# Patient Record
Sex: Female | Born: 1950
Health system: Southern US, Community
[De-identification: ages and names within clinical notes are randomized; demographics above are authoritative.]

## PROBLEM LIST (undated history)

## (undated) DIAGNOSIS — E785 Hyperlipidemia, unspecified: Secondary | ICD-10-CM

## (undated) DIAGNOSIS — F419 Anxiety disorder, unspecified: Secondary | ICD-10-CM

## (undated) DIAGNOSIS — F32A Depression, unspecified: Secondary | ICD-10-CM

## (undated) DIAGNOSIS — K219 Gastro-esophageal reflux disease without esophagitis: Secondary | ICD-10-CM

## (undated) DIAGNOSIS — T7840XA Allergy, unspecified, initial encounter: Secondary | ICD-10-CM

## (undated) DIAGNOSIS — R011 Cardiac murmur, unspecified: Secondary | ICD-10-CM

## (undated) DIAGNOSIS — E079 Disorder of thyroid, unspecified: Secondary | ICD-10-CM

## (undated) HISTORY — PX: ABDOMINAL HYSTERECTOMY: SHX81

## (undated) HISTORY — DX: Cardiac murmur, unspecified: R01.1

## (undated) HISTORY — DX: Allergy, unspecified, initial encounter: T78.40XA

## (undated) HISTORY — DX: Disorder of thyroid, unspecified: E07.9

## (undated) HISTORY — DX: Hyperlipidemia, unspecified: E78.5

## (undated) HISTORY — PX: TONSILLECTOMY: SUR1361

## (undated) HISTORY — DX: Depression, unspecified: F32.A

## (undated) HISTORY — DX: Gastro-esophageal reflux disease without esophagitis: K21.9

## (undated) HISTORY — PX: BREAST BIOPSY: SHX20

## (undated) HISTORY — DX: Anxiety disorder, unspecified: F41.9

---

## 2016-08-06 DIAGNOSIS — M79604 Pain in right leg: Secondary | ICD-10-CM | POA: Diagnosis not present

## 2016-08-21 ENCOUNTER — Ambulatory Visit (INDEPENDENT_AMBULATORY_CARE_PROVIDER_SITE_OTHER): Payer: Medicare Other | Admitting: Internal Medicine

## 2016-08-21 ENCOUNTER — Encounter: Payer: Self-pay | Admitting: Internal Medicine

## 2016-08-21 VITALS — BP 140/80 | HR 65 | Temp 97.5°F | Ht 60.0 in | Wt 110.6 lb

## 2016-08-21 DIAGNOSIS — Z1239 Encounter for other screening for malignant neoplasm of breast: Secondary | ICD-10-CM

## 2016-08-21 DIAGNOSIS — E7800 Pure hypercholesterolemia, unspecified: Secondary | ICD-10-CM | POA: Diagnosis not present

## 2016-08-21 DIAGNOSIS — Z1231 Encounter for screening mammogram for malignant neoplasm of breast: Secondary | ICD-10-CM

## 2016-08-21 DIAGNOSIS — Z87898 Personal history of other specified conditions: Secondary | ICD-10-CM

## 2016-08-21 DIAGNOSIS — Z720 Tobacco use: Secondary | ICD-10-CM

## 2016-08-21 DIAGNOSIS — Z8742 Personal history of other diseases of the female genital tract: Secondary | ICD-10-CM

## 2016-08-21 DIAGNOSIS — Z1211 Encounter for screening for malignant neoplasm of colon: Secondary | ICD-10-CM

## 2016-08-21 DIAGNOSIS — E039 Hypothyroidism, unspecified: Secondary | ICD-10-CM

## 2016-08-21 DIAGNOSIS — Z9109 Other allergy status, other than to drugs and biological substances: Secondary | ICD-10-CM

## 2016-08-21 LAB — CBC WITH DIFFERENTIAL/PLATELET
BASOS PCT: 0.5 % (ref 0.0–3.0)
Basophils Absolute: 0.1 10*3/uL (ref 0.0–0.1)
EOS PCT: 1.1 % (ref 0.0–5.0)
Eosinophils Absolute: 0.1 10*3/uL (ref 0.0–0.7)
HCT: 42 % (ref 36.0–46.0)
Hemoglobin: 14.1 g/dL (ref 12.0–15.0)
LYMPHS ABS: 4.1 10*3/uL — AB (ref 0.7–4.0)
Lymphocytes Relative: 36.2 % (ref 12.0–46.0)
MCHC: 33.5 g/dL (ref 30.0–36.0)
MCV: 96.6 fl (ref 78.0–100.0)
MONOS PCT: 5.9 % (ref 3.0–12.0)
Monocytes Absolute: 0.7 10*3/uL (ref 0.1–1.0)
NEUTROS ABS: 6.4 10*3/uL (ref 1.4–7.7)
NEUTROS PCT: 56.3 % (ref 43.0–77.0)
Platelets: 274 10*3/uL (ref 150.0–400.0)
RBC: 4.34 Mil/uL (ref 3.87–5.11)
RDW: 13.5 % (ref 11.5–15.5)
WBC: 11.3 10*3/uL — ABNORMAL HIGH (ref 4.0–10.5)

## 2016-08-21 LAB — LIPID PANEL
CHOLESTEROL: 172 mg/dL (ref 0–200)
HDL: 67.9 mg/dL (ref >39.00)
LDL CALC: 90 mg/dL (ref 0–99)
NonHDL: 104.3
TRIGLYCERIDES: 71 mg/dL (ref 0.0–149.0)
Total CHOL/HDL Ratio: 3
VLDL: 14.2 mg/dL (ref 0.0–40.0)

## 2016-08-21 LAB — HEPATIC FUNCTION PANEL
ALBUMIN: 4.5 g/dL (ref 3.5–5.2)
ALT: 19 U/L (ref 0–35)
AST: 17 U/L (ref 0–37)
Alkaline Phosphatase: 73 U/L (ref 39–117)
Bilirubin, Direct: 0.1 mg/dL (ref 0.0–0.3)
TOTAL PROTEIN: 7 g/dL (ref 6.0–8.3)
Total Bilirubin: 0.5 mg/dL (ref 0.2–1.2)

## 2016-08-21 LAB — BASIC METABOLIC PANEL
BUN: 7 mg/dL (ref 6–23)
CALCIUM: 10.1 mg/dL (ref 8.4–10.5)
CO2: 31 meq/L (ref 19–32)
CREATININE: 0.72 mg/dL (ref 0.40–1.20)
Chloride: 107 mEq/L (ref 96–112)
GFR: 86.36 mL/min (ref >60.00)
Glucose, Bld: 84 mg/dL (ref 70–99)
Potassium: 4.8 mEq/L (ref 3.5–5.1)
Sodium: 143 mEq/L (ref 135–145)

## 2016-08-21 LAB — TSH: TSH: 2.31 u[IU]/mL (ref 0.35–4.50)

## 2016-08-21 NOTE — Progress Notes (Signed)
Patient ID: Leah Benitez, female   DOB: 07/17/1951, 65 y.o.   MRN: [id number]   Subjective:    Patient ID: Leah Benitez, female    DOB: 1951/06/23, 65 y.o.   MRN: [id number]  HPI  Patient here to establish care.  She has a history of hypothyroidism and hypercholesterolemia.  Previously followed at [facility].  She smokes.  Has smoked for 40 years.  Tries to stay active.  No chest pain.  No sob.  No acid reflux.  No abdominal pain or cramping.  Bowels stable.  Previously on cholesterol medication.  Had joint aches and muscle aches.  Previous colonoscopy - no polyps.  Recommended f/u colonoscopy in 10 years.  Some allergy issues.  Takes zyrtec and mucinex prn     Past Medical History:  Diagnosis Date  . Allergy   . Hyperlipidemia   . Thyroid disease    Past Surgical History:  Procedure Laterality Date  . ABDOMINAL HYSTERECTOMY     previous abnormal pap smear  . BREAST BIOPSY    . TONSILLECTOMY     Family History  Problem Relation Age of Onset  . Arthritis Mother   . Stroke Mother   . Hypertension Mother   . Arthritis Father   . Heart disease Father   . Heart disease Brother   . Cancer Maternal Aunt     breast   Social History   Social History  . Marital status: Married    Spouse name: N/A  . Number of children: N/A  . Years of education: N/A   Social History Main Topics  . Smoking status: Current Every Day Smoker  . Smokeless tobacco: Never Used  . Alcohol use None  . Drug use: Unknown  . Sexual activity: Not Asked   Other Topics Concern  . None   Social History Narrative  . None    Outpatient Encounter Prescriptions as of 08/21/2016  Medication Sig  . atorvastatin (LIPITOR) 20 MG tablet Take 20 mg by mouth daily.  . ergocalciferol (VITAMIN D2) 50000 units capsule Take 50,000 Units by mouth once a week.  . levothyroxine (SYNTHROID, LEVOTHROID) 88 MCG tablet Take 88 mcg by mouth daily.   No facility-administered encounter medications on file as of  08/21/2016.     Review of Systems  Constitutional: Negative for appetite change and unexpected weight change.  HENT: Negative for congestion and sinus pressure.   Respiratory: Negative for cough, chest tightness and shortness of breath.   Cardiovascular: Negative for chest pain, palpitations and leg swelling.  Gastrointestinal: Negative for abdominal pain, diarrhea, nausea and vomiting.  Genitourinary: Negative for difficulty urinating and dysuria.  Musculoskeletal: Negative for back pain.  Skin: Negative for color change and rash.  Neurological: Negative for dizziness, light-headedness and headaches.  Psychiatric/Behavioral: Negative for agitation and dysphoric mood.       Objective:     Blood pressure rechecked by me:  132/72  Physical Exam  Constitutional: She appears well-developed and well-nourished. No distress.  HENT:  Nose: Nose normal.  Mouth/Throat: Oropharynx is clear and moist.  Neck: Neck supple. No thyromegaly present.  Cardiovascular: Normal rate and regular rhythm.   Pulmonary/Chest: Breath sounds normal. No respiratory distress. She has no wheezes.  Abdominal: Soft. Bowel sounds are normal. There is no tenderness.  Musculoskeletal: She exhibits no edema or tenderness.  Lymphadenopathy:    She has no cervical adenopathy.  Skin: No rash noted. No erythema.  Psychiatric: She has a normal mood and affect. Her behavior  is normal.    BP 140/80   Pulse 65   Temp 97.5 F (36.4 C) (Oral)   Ht 5' (1.524 m)   Wt 110 lb 9.6 oz (50.2 kg)   SpO2 98%   BMI 21.60 kg/m  Wt Readings from Last 3 Encounters:  08/21/16 110 lb 9.6 oz (50.2 kg)       Assessment & Plan:   Problem List Items Addressed This Visit    Colon cancer screening    Had colonoscopy - not sure of date.  No polyps.  Recommended f/u colonoscopy in 10 years.        Environmental allergies    Doing well on current regimen.  Follow.        History of abnormal cervical Pap smear    S/p  hysterectomy.        Hypercholesterolemia    Unable to take statin medication.  Low cholesterol diet and exercise.  Follow lipid panel.        Relevant Medications   atorvastatin (LIPITOR) 20 MG tablet   Other Relevant Orders   CBC with Differential/Platelet (Completed)   Basic metabolic panel (Completed)   Hepatic function panel (Completed)   TSH (Completed)   Lipid panel (Completed)   Hypothyroidism    On synthroid.  Follow tsh.        Relevant Medications   levothyroxine (SYNTHROID, LEVOTHROID) 88 MCG tablet   Tobacco abuse    Discussed with her today regarding the need to stop smoking.  She continues to smoke.  Discussed screening CT.  Will contact to schedule.         Other Visit Diagnoses    Encounter for breast cancer screening other than mammogram    -  Primary   Breast cancer screening, high risk patient       Relevant Orders   MM Digital Screening     I spent 45 minutes with the patient and more than 50% of the time was spent in consultation regarding the above.  Time spent obtaining previous history and discussing previous w/up and treatment.  Also time spent discussing current concerns and treatment plan.     Einar Pheasant, MD

## 2016-08-21 NOTE — Patient Instructions (Signed)
prevnar - (new pneumonia shot) 

## 2016-08-21 NOTE — Progress Notes (Signed)
Pre visit review using our clinic review tool, if applicable. No additional management support is needed unless otherwise documented below in the visit note. 

## 2016-08-22 ENCOUNTER — Telehealth: Payer: Self-pay

## 2016-08-22 ENCOUNTER — Other Ambulatory Visit: Payer: Self-pay | Admitting: Internal Medicine

## 2016-08-22 DIAGNOSIS — D72829 Elevated white blood cell count, unspecified: Secondary | ICD-10-CM

## 2016-08-22 NOTE — Progress Notes (Signed)
Order placed for f/u cbc.   

## 2016-08-22 NOTE — Telephone Encounter (Signed)
-----   Message from Einar Pheasant, MD sent at 08/22/2016  3:01 AM EST ----- Notify pt that her cholesterol looks good.  Her white blood cell count is slightly elevated.  May just be a normal variant.  Will need to repeat to confirm stable.  Thyroid test, kidney function tests and liver function tests are wnl.  Recheck cbc in 3 weeks.

## 2016-08-31 ENCOUNTER — Encounter: Payer: Self-pay | Admitting: Internal Medicine

## 2016-08-31 DIAGNOSIS — Z1211 Encounter for screening for malignant neoplasm of colon: Secondary | ICD-10-CM | POA: Insufficient documentation

## 2016-08-31 DIAGNOSIS — Z720 Tobacco use: Secondary | ICD-10-CM | POA: Insufficient documentation

## 2016-08-31 DIAGNOSIS — Z9109 Other allergy status, other than to drugs and biological substances: Secondary | ICD-10-CM | POA: Insufficient documentation

## 2016-08-31 DIAGNOSIS — Z8742 Personal history of other diseases of the female genital tract: Secondary | ICD-10-CM | POA: Insufficient documentation

## 2016-08-31 DIAGNOSIS — E039 Hypothyroidism, unspecified: Secondary | ICD-10-CM | POA: Insufficient documentation

## 2016-08-31 NOTE — Assessment & Plan Note (Signed)
Had colonoscopy - not sure of date.  No polyps.  Recommended f/u colonoscopy in 10 years.

## 2016-08-31 NOTE — Assessment & Plan Note (Signed)
Doing well on current regimen.  Follow.   

## 2016-08-31 NOTE — Assessment & Plan Note (Signed)
On synthroid.  Follow tsh.   

## 2016-08-31 NOTE — Assessment & Plan Note (Signed)
S/p hysterectomy.  

## 2016-08-31 NOTE — Assessment & Plan Note (Signed)
Unable to take statin medication.  Low cholesterol diet and exercise.  Follow lipid panel.  

## 2016-08-31 NOTE — Assessment & Plan Note (Signed)
Discussed with her today regarding the need to stop smoking.  She continues to smoke.  Discussed screening CT.  Will contact to schedule.

## 2016-09-09 DIAGNOSIS — Z1231 Encounter for screening mammogram for malignant neoplasm of breast: Secondary | ICD-10-CM | POA: Diagnosis not present

## 2016-09-09 LAB — HM MAMMOGRAPHY

## 2016-09-10 ENCOUNTER — Encounter: Payer: Self-pay | Admitting: Internal Medicine

## 2016-09-12 ENCOUNTER — Other Ambulatory Visit (INDEPENDENT_AMBULATORY_CARE_PROVIDER_SITE_OTHER): Payer: Medicare Other

## 2016-09-12 DIAGNOSIS — D72829 Elevated white blood cell count, unspecified: Secondary | ICD-10-CM

## 2016-09-12 LAB — CBC WITH DIFFERENTIAL/PLATELET
BASOS PCT: 0.6 % (ref 0.0–3.0)
Basophils Absolute: 0.1 10*3/uL (ref 0.0–0.1)
EOS ABS: 0.1 10*3/uL (ref 0.0–0.7)
EOS PCT: 1.2 % (ref 0.0–5.0)
HCT: 41.5 % (ref 36.0–46.0)
Hemoglobin: 14.2 g/dL (ref 12.0–15.0)
LYMPHS ABS: 3 10*3/uL (ref 0.7–4.0)
Lymphocytes Relative: 27.6 % (ref 12.0–46.0)
MCHC: 34.2 g/dL (ref 30.0–36.0)
MCV: 95.9 fl (ref 78.0–100.0)
MONO ABS: 0.7 10*3/uL (ref 0.1–1.0)
Monocytes Relative: 6.3 % (ref 3.0–12.0)
NEUTROS ABS: 6.9 10*3/uL (ref 1.4–7.7)
NEUTROS PCT: 64.3 % (ref 43.0–77.0)
PLATELETS: 279 10*3/uL (ref 150.0–400.0)
RBC: 4.33 Mil/uL (ref 3.87–5.11)
RDW: 13.4 % (ref 11.5–15.5)
WBC: 10.8 10*3/uL — ABNORMAL HIGH (ref 4.0–10.5)

## 2016-10-17 DIAGNOSIS — M109 Gout, unspecified: Secondary | ICD-10-CM | POA: Diagnosis not present

## 2016-10-17 DIAGNOSIS — M8430XA Stress fracture, unspecified site, initial encounter for fracture: Secondary | ICD-10-CM | POA: Diagnosis not present

## 2016-10-17 DIAGNOSIS — M79671 Pain in right foot: Secondary | ICD-10-CM | POA: Diagnosis not present

## 2016-10-31 DIAGNOSIS — Z23 Encounter for immunization: Secondary | ICD-10-CM | POA: Diagnosis not present

## 2016-11-03 ENCOUNTER — Telehealth: Payer: Self-pay | Admitting: *Deleted

## 2016-11-03 DIAGNOSIS — Z87891 Personal history of nicotine dependence: Secondary | ICD-10-CM

## 2016-11-03 NOTE — Telephone Encounter (Signed)
Received referral for initial lung cancer screening scan. Contacted patient and obtained smoking history,(current, 45 pack year) as well as answering questions related to screening process. Patient denies signs of lung cancer such as weight loss or hemoptysis. Patient denies comorbidity that would prevent curative treatment if lung cancer were found. Patient is tentatively scheduled for shared decision making visit and CT scan on 11/11/16 at 1:30pm, pending insurance approval from business office.

## 2016-11-07 DIAGNOSIS — M79671 Pain in right foot: Secondary | ICD-10-CM | POA: Diagnosis not present

## 2016-11-07 DIAGNOSIS — M84374D Stress fracture, right foot, subsequent encounter for fracture with routine healing: Secondary | ICD-10-CM | POA: Diagnosis not present

## 2016-11-11 ENCOUNTER — Ambulatory Visit
Admission: RE | Admit: 2016-11-11 | Discharge: 2016-11-11 | Disposition: A | Discharge status: Home / Self Care | Payer: Medicare Other | Source: Ambulatory Visit | Attending: Oncology | Admitting: Oncology

## 2016-11-11 ENCOUNTER — Inpatient Hospital Stay: Payer: Medicare Other | Attending: Oncology | Admitting: Oncology

## 2016-11-11 ENCOUNTER — Encounter (INDEPENDENT_AMBULATORY_CARE_PROVIDER_SITE_OTHER): Payer: Self-pay

## 2016-11-11 ENCOUNTER — Encounter: Payer: Self-pay | Admitting: Oncology

## 2016-11-11 DIAGNOSIS — J439 Emphysema, unspecified: Secondary | ICD-10-CM | POA: Diagnosis not present

## 2016-11-11 DIAGNOSIS — I700 Atherosclerosis of aorta: Secondary | ICD-10-CM | POA: Insufficient documentation

## 2016-11-11 DIAGNOSIS — Z122 Encounter for screening for malignant neoplasm of respiratory organs: Secondary | ICD-10-CM | POA: Diagnosis not present

## 2016-11-11 DIAGNOSIS — Z87891 Personal history of nicotine dependence: Secondary | ICD-10-CM

## 2016-11-11 DIAGNOSIS — F17210 Nicotine dependence, cigarettes, uncomplicated: Secondary | ICD-10-CM | POA: Diagnosis not present

## 2016-11-11 DIAGNOSIS — I2510 Atherosclerotic heart disease of native coronary artery without angina pectoris: Secondary | ICD-10-CM | POA: Diagnosis not present

## 2016-11-12 DIAGNOSIS — Z87891 Personal history of nicotine dependence: Secondary | ICD-10-CM | POA: Insufficient documentation

## 2016-11-12 NOTE — Progress Notes (Signed)
In accordance with CMS guidelines, patient has met eligibility criteria including age, absence of signs or symptoms of lung cancer.  Social History  Substance Use Topics  . Smoking status: Current Every Day Smoker    Packs/day: 1.00    Years: 45.00    Types: Cigarettes  . Smokeless tobacco: Never Used  . Alcohol use Not on file     A shared decision-making session was conducted prior to the performance of CT scan. This includes one or more decision aids, includes benefits and harms of screening, follow-up diagnostic testing, over-diagnosis, false positive rate, and total radiation exposure.  Counseling on the importance of adherence to annual lung cancer LDCT screening, impact of co-morbidities, and ability or willingness to undergo diagnosis and treatment is imperative for compliance of the program.  Counseling on the importance of continued smoking cessation for former smokers; the importance of smoking cessation for current smokers, and information about tobacco cessation interventions have been given to patient including [place] and [facility] quit [place] programs.  Written order for lung cancer screening with LDCT has been given to the patient and any and all questions have been answered to the best of my abilities.   Yearly follow up will be coordinated by Burgess Estelle, Thoracic Navigator.

## 2016-11-14 ENCOUNTER — Encounter: Payer: Self-pay | Admitting: *Deleted

## 2016-11-17 ENCOUNTER — Ambulatory Visit (INDEPENDENT_AMBULATORY_CARE_PROVIDER_SITE_OTHER): Payer: Medicare Other | Admitting: Internal Medicine

## 2016-11-17 ENCOUNTER — Encounter: Payer: Self-pay | Admitting: Internal Medicine

## 2016-11-17 VITALS — BP 120/60 | HR 82 | Temp 98.6°F | Ht 60.0 in | Wt 111.0 lb

## 2016-11-17 DIAGNOSIS — Z87898 Personal history of other specified conditions: Secondary | ICD-10-CM | POA: Diagnosis not present

## 2016-11-17 DIAGNOSIS — E039 Hypothyroidism, unspecified: Secondary | ICD-10-CM | POA: Diagnosis not present

## 2016-11-17 DIAGNOSIS — E559 Vitamin D deficiency, unspecified: Secondary | ICD-10-CM

## 2016-11-17 DIAGNOSIS — Z0000 Encounter for general adult medical examination without abnormal findings: Secondary | ICD-10-CM | POA: Diagnosis not present

## 2016-11-17 DIAGNOSIS — Z9109 Other allergy status, other than to drugs and biological substances: Secondary | ICD-10-CM | POA: Diagnosis not present

## 2016-11-17 DIAGNOSIS — M84374D Stress fracture, right foot, subsequent encounter for fracture with routine healing: Secondary | ICD-10-CM | POA: Diagnosis not present

## 2016-11-17 DIAGNOSIS — R0602 Shortness of breath: Secondary | ICD-10-CM

## 2016-11-17 DIAGNOSIS — E2839 Other primary ovarian failure: Secondary | ICD-10-CM | POA: Diagnosis not present

## 2016-11-17 DIAGNOSIS — Z8742 Personal history of other diseases of the female genital tract: Secondary | ICD-10-CM

## 2016-11-17 DIAGNOSIS — Z720 Tobacco use: Secondary | ICD-10-CM

## 2016-11-17 DIAGNOSIS — E7800 Pure hypercholesterolemia, unspecified: Secondary | ICD-10-CM

## 2016-11-17 MED ORDER — DOXYCYCLINE HYCLATE 100 MG PO TABS: 100.0000 mg | ORAL_TABLET | Freq: Two times a day (BID) | ORAL | 0 refills | Status: DC

## 2016-11-17 NOTE — Progress Notes (Signed)
Patient ID: Leah Benitez, female   DOB: [birth year], 66 y.o.   MRN: [id number]   Subjective:    Patient ID: Leah Benitez, female    DOB: 1951-02-05, 66 y.o.   MRN: [id number]  HPI  Patient with past history of hypercholesterolemia and thyroid disease.  Comes in today to follow up on these issues as well as for a complete physical exam.  She reports some increased drainage and cough.  Left ear feels a little full.  Taking zyrtec and flonase.  Mucinex.  Subjective fever.  Some productive cough.  Symptoms started 4 weeks ago.  No chest pain.  She reports some sob with exertion.  Has stress fracture of right foot.  Stopped wearing her boot last week.  Being Followed by [facility].  Is better.     Past Medical History:  Diagnosis Date  . Allergy   . Hyperlipidemia   . Thyroid disease    Past Surgical History:  Procedure Laterality Date  . ABDOMINAL HYSTERECTOMY     previous abnormal pap smear  . BREAST BIOPSY    . TONSILLECTOMY     Family History  Problem Relation Age of Onset  . Arthritis Mother   . Stroke Mother   . Hypertension Mother   . Arthritis Father   . Heart disease Father   . Heart disease Brother   . Cancer Maternal Aunt     breast   Social History   Social History  . Marital status: Married    Spouse name: N/A  . Number of children: N/A  . Years of education: N/A   Social History Main Topics  . Smoking status: Current Every Day Smoker    Packs/day: 1.00    Years: 45.00    Types: Cigarettes  . Smokeless tobacco: Never Used  . Alcohol use None  . Drug use: Unknown  . Sexual activity: Not Asked   Other Topics Concern  . None   Social History Narrative  . None    Outpatient Encounter Prescriptions as of 11/17/2016  Medication Sig  . atorvastatin (LIPITOR) 20 MG tablet Take 20 mg by mouth daily.  . ergocalciferol (VITAMIN D2) 50000 units capsule Take 50,000 Units by mouth once a week.  . Esomeprazole Magnesium (NEXIUM PO) Take by mouth.  .  levothyroxine (SYNTHROID, LEVOTHROID) 88 MCG tablet Take 88 mcg by mouth daily.  Marland Kitchen doxycycline (VIBRA-TABS) 100 MG tablet Take 1 tablet (100 mg total) by mouth 2 (two) times daily.   No facility-administered encounter medications on file as of 11/17/2016.     Review of Systems  Constitutional: Negative for appetite change and unexpected weight change.  HENT: Positive for congestion and postnasal drip. Negative for sinus pressure and sore throat.   Eyes: Negative for pain and visual disturbance.  Respiratory: Positive for cough. Negative for chest tightness and shortness of breath.   Cardiovascular: Negative for chest pain and palpitations.  Gastrointestinal: Negative for abdominal pain, diarrhea, nausea and vomiting.  Genitourinary: Negative for difficulty urinating and dysuria.  Musculoskeletal: Negative for back pain and joint swelling.  Skin: Negative for color change and rash.  Neurological: Negative for dizziness and headaches.  Hematological: Negative for adenopathy. Does not bruise/bleed easily.  Psychiatric/Behavioral: Negative for agitation and dysphoric mood.       Objective:    Physical Exam  Constitutional: She is oriented to person, place, and time. She appears well-developed and well-nourished. No distress.  HENT:  Nose: Nose normal.  Mouth/Throat: Oropharynx is clear  and moist.  Nares - slightly erythematous turbinates.  Minimal tenderness to palpation over the sinuses.    Eyes: Right eye exhibits no discharge. Left eye exhibits no discharge. No scleral icterus.  Neck: Neck supple. No thyromegaly present.  Cardiovascular: Normal rate and regular rhythm.   Pulmonary/Chest: Breath sounds normal. No accessory muscle usage. No tachypnea. No respiratory distress. She has no decreased breath sounds. She has no wheezes. She has no rhonchi. Right breast exhibits no inverted nipple, no mass, no nipple discharge and no tenderness (no axillary adenopathy). Left breast exhibits no  inverted nipple, no mass, no nipple discharge and no tenderness (no axilarry adenopathy).  Abdominal: Soft. Bowel sounds are normal. There is no tenderness.  Musculoskeletal: She exhibits no edema or tenderness.  Lymphadenopathy:    She has no cervical adenopathy.  Neurological: She is alert and oriented to person, place, and time.  Skin: Skin is warm. No rash noted. No erythema.  Psychiatric: She has a normal mood and affect. Her behavior is normal.    BP 120/60 (BP Location: Left Arm, Patient Position: Sitting, Cuff Size: Large)   Pulse 82   Temp 98.6 F (37 C) (Oral)   Ht 5' (1.524 m)   Wt 111 lb (50.3 kg)   SpO2 98%   BMI 21.68 kg/m  Wt Readings from Last 3 Encounters:  11/17/16 111 lb (50.3 kg)  11/11/16 115 lb (52.2 kg)  08/21/16 110 lb 9.6 oz (50.2 kg)     Lab Results  Component Value Date   WBC 10.8 (H) 09/12/2016   HGB 14.2 09/12/2016   HCT 41.5 09/12/2016   PLT 279.0 09/12/2016   GLUCOSE 84 08/21/2016   CHOL 172 08/21/2016   TRIG 71.0 08/21/2016   HDL 67.90 08/21/2016   LDLCALC 90 08/21/2016   ALT 19 08/21/2016   AST 17 08/21/2016   NA 143 08/21/2016   K 4.8 08/21/2016   CL 107 08/21/2016   CREATININE 0.72 08/21/2016   BUN 7 08/21/2016   CO2 31 08/21/2016   TSH 2.31 08/21/2016    Ct Chest Lung Cancer Screening Low Dose Wo Contrast  Result Date: 11/11/2016 CLINICAL DATA:  Lung cancer screening. Asymptomatic smoker. Forty-five pack-year history. EXAM: CT CHEST WITHOUT CONTRAST LOW-DOSE FOR LUNG CANCER SCREENING TECHNIQUE: Multidetector CT imaging of the chest was performed following the standard protocol without IV contrast. COMPARISON:  None. FINDINGS: Cardiovascular: Normal heart size. Aortic atherosclerosis. Calcification within the LAD coronary artery noted. Mediastinum/Nodes: No enlarged mediastinal, hilar, or axillary lymph nodes. Thyroid gland, trachea, and esophagus demonstrate no significant findings. Lungs/Pleura: No pleural fluid. Mild to moderate  changes of centrilobular emphysema. There are a few small pulmonary nodules. The largest has an equivalent diameter of 4.7 mm and is in the anterior right apex, image 27 of series 3. Upper Abdomen: No acute abnormality. Musculoskeletal: No chest wall mass or suspicious bone lesions identified. IMPRESSION: 1. Lung-RADS Category 2, benign appearance or behavior. Continue annual screening with low-dose chest CT without contrast in 12 months 2. Diffuse bronchial wall thickening with emphysema, as above; imaging findings suggestive of underlying COPD. 3. Aortic atherosclerosis and coronary artery calcification Electronically Signed   By: Kerby Moors M.D.   On: 11/11/2016 18:19       Assessment & Plan:   Problem List Items Addressed This Visit    Environmental allergies    With increased drainage and congestion and cough as outlined.  Treat with saline nasal spray, mucinex and nasacort/flonase as directed.  rx for  doxycycline given.  If symptoms progress, take abx.  Probiotic as directed.        Healthcare maintenance    Physical today 11/17/16.  Mammogram 09/09/16 - Birads I.  Had colonoscopy.  Need date.        History of abnormal cervical Pap smear    S/p hysterectomy.        Hypercholesterolemia    Low cholesterol diet and exercise.  Follow lipid panel and liver function tests.  On lipitor.        Relevant Orders   Lipid panel   Hepatic function panel   Basic metabolic panel   Hypothyroidism    On thyroid medication.  Follow tsh.       SOB (shortness of breath) - Primary    Some sob with exertion.  She relates to being sick.  EKG - SR no acute ischemic changes with PACs.  Discussed further w/up including cardiology referral.  She declines.  Wants to monitor.  Follow.       Relevant Orders   EKG 12-Lead (Completed)   Tobacco abuse    Have discussed the need for stopping smoking.  Follow.        Other Visit Diagnoses    Stress fracture of right foot with routine healing,  subsequent encounter       following with podiatry.  out of boot.  check bone density.     Relevant Orders   DG Bone Density   Estrogen deficiency       Relevant Orders   DG Bone Density   Vitamin D deficiency       check vitamin D level with next labs.    Relevant Orders   VITAMIN D 25 Hydroxy (Vit-D Deficiency, Fractures)       Einar Pheasant, MD

## 2016-11-17 NOTE — Progress Notes (Signed)
Pre-visit discussion using our clinic review tool. No additional management support is needed unless otherwise documented below in the visit note.  

## 2016-11-17 NOTE — Patient Instructions (Signed)
Saline nasal spray - flush nose at least 2-3x/day  Continue flonase nasal spray - 2 sprays each nostril one time per day.  Do this in the evening.    Continue mucinex.    If no improvement - take the antibiotic.    Take a probiotic while you are on the antibiotic and for two weeks after completing the antibiotic.   Examples of probiotics are culturelle, align or florastor.

## 2016-11-24 ENCOUNTER — Encounter: Payer: Self-pay | Admitting: Internal Medicine

## 2016-11-24 DIAGNOSIS — R0602 Shortness of breath: Secondary | ICD-10-CM | POA: Insufficient documentation

## 2016-11-24 DIAGNOSIS — R0600 Dyspnea, unspecified: Secondary | ICD-10-CM | POA: Insufficient documentation

## 2016-11-24 DIAGNOSIS — Z0000 Encounter for general adult medical examination without abnormal findings: Secondary | ICD-10-CM | POA: Insufficient documentation

## 2016-11-24 NOTE — Assessment & Plan Note (Signed)
S/p hysterectomy.  

## 2016-11-24 NOTE — Assessment & Plan Note (Signed)
With increased drainage and congestion and cough as outlined.  Treat with saline nasal spray, mucinex and nasacort/flonase as directed.  rx for doxycycline given.  If symptoms progress, take abx.  Probiotic as directed.

## 2016-11-24 NOTE — Assessment & Plan Note (Signed)
Physical today 11/17/16.  Mammogram 09/09/16 - Birads I.  Had colonoscopy.  Need date.

## 2016-11-24 NOTE — Assessment & Plan Note (Signed)
Some sob with exertion.  She relates to being sick.  EKG - SR no acute ischemic changes with PACs.  Discussed further w/up including cardiology referral.  She declines.  Wants to monitor.  Follow.

## 2016-11-24 NOTE — Assessment & Plan Note (Signed)
Have discussed the need for stopping smoking.  Follow.

## 2016-11-24 NOTE — Assessment & Plan Note (Signed)
On thyroid medication.  Follow tsh.  

## 2016-11-24 NOTE — Assessment & Plan Note (Signed)
Low cholesterol diet and exercise.  Follow lipid panel and liver function tests.  On lipitor.   

## 2016-12-05 ENCOUNTER — Encounter: Payer: Self-pay | Admitting: Internal Medicine

## 2016-12-05 ENCOUNTER — Other Ambulatory Visit: Payer: Self-pay | Admitting: Internal Medicine

## 2016-12-08 DIAGNOSIS — H2513 Age-related nuclear cataract, bilateral: Secondary | ICD-10-CM | POA: Diagnosis not present

## 2016-12-08 DIAGNOSIS — H524 Presbyopia: Secondary | ICD-10-CM | POA: Diagnosis not present

## 2017-01-16 DIAGNOSIS — W1841XA Slipping, tripping and stumbling without falling due to stepping on object, initial encounter: Secondary | ICD-10-CM | POA: Diagnosis not present

## 2017-01-16 DIAGNOSIS — Z888 Allergy status to other drugs, medicaments and biological substances status: Secondary | ICD-10-CM | POA: Diagnosis not present

## 2017-01-16 DIAGNOSIS — Z880 Allergy status to penicillin: Secondary | ICD-10-CM | POA: Diagnosis not present

## 2017-01-16 DIAGNOSIS — S060X0A Concussion without loss of consciousness, initial encounter: Secondary | ICD-10-CM | POA: Diagnosis not present

## 2017-01-16 DIAGNOSIS — S0101XA Laceration without foreign body of scalp, initial encounter: Secondary | ICD-10-CM | POA: Diagnosis not present

## 2017-01-16 DIAGNOSIS — E7800 Pure hypercholesterolemia, unspecified: Secondary | ICD-10-CM | POA: Diagnosis not present

## 2017-01-16 DIAGNOSIS — M5136 Other intervertebral disc degeneration, lumbar region: Secondary | ICD-10-CM | POA: Diagnosis not present

## 2017-01-16 DIAGNOSIS — W010XXA Fall on same level from slipping, tripping and stumbling without subsequent striking against object, initial encounter: Secondary | ICD-10-CM | POA: Diagnosis not present

## 2017-01-16 DIAGNOSIS — S5001XA Contusion of right elbow, initial encounter: Secondary | ICD-10-CM | POA: Diagnosis not present

## 2017-01-16 DIAGNOSIS — M50323 Other cervical disc degeneration at C6-C7 level: Secondary | ICD-10-CM | POA: Diagnosis not present

## 2017-01-16 DIAGNOSIS — S52611A Displaced fracture of right ulna styloid process, initial encounter for closed fracture: Secondary | ICD-10-CM | POA: Diagnosis not present

## 2017-01-20 ENCOUNTER — Telehealth: Payer: Self-pay | Admitting: *Deleted

## 2017-01-20 DIAGNOSIS — S66919A Strain of unspecified muscle, fascia and tendon at wrist and hand level, unspecified hand, initial encounter: Secondary | ICD-10-CM | POA: Diagnosis not present

## 2017-01-20 DIAGNOSIS — R42 Dizziness and giddiness: Secondary | ICD-10-CM | POA: Diagnosis not present

## 2017-01-20 DIAGNOSIS — M25531 Pain in right wrist: Secondary | ICD-10-CM | POA: Diagnosis not present

## 2017-01-20 DIAGNOSIS — S63509A Unspecified sprain of unspecified wrist, initial encounter: Secondary | ICD-10-CM | POA: Diagnosis not present

## 2017-01-20 NOTE — Telephone Encounter (Signed)
Spoke to patient she will go back and have them f/u with it. She will call our office after to let us know and so that we can make f/u app.

## 2017-01-20 NOTE — Telephone Encounter (Signed)
Pt will need a ER follow up on Friday and staples (6) removed from the back of her head by Friday.  Please give a time and date to place pt.  Pt contact [phone]

## 2017-01-20 NOTE — Telephone Encounter (Signed)
Where do you want me to schedule patient

## 2017-01-20 NOTE — Telephone Encounter (Signed)
Since she has a head injury and is having persistent headache and dizziness, I would prefer her to go ahead and be evaluated to confirm nothing more acute going on and then can f/u here after.

## 2017-01-20 NOTE — Telephone Encounter (Signed)
Called patient was seen at [facility] on Friday. Fell over dog and hit head on concrete planter. She was seen and had xray done per patient with staples. She wanted to make f/u with you by Friday to have removed. I have sent fax for records per the medical records department we should have in 24hrs. Patient is still having dizziness was seen by ortho today for wrist and given script for patch that goes behind ears. She is having head ache at the site of the cut only not all over her head like before. She denies any changes in vision.

## 2017-01-20 NOTE — Telephone Encounter (Signed)
Need more info.  What ER did she go to?  When was she seen?  What happened?  Need records.

## 2017-01-21 NOTE — Telephone Encounter (Signed)
ED records reviewed.  Agree with evaluation.

## 2017-01-21 NOTE — Telephone Encounter (Signed)
Called patient to check on her. She did not go back to get evaluated yesterday. States that she still is very dizzy. I stressed that she should be evaluated. She states she may go back tonight. Notes received from ED visit I have put them in your green folder for review.

## 2017-01-22 ENCOUNTER — Other Ambulatory Visit: Payer: Self-pay | Admitting: Internal Medicine

## 2017-01-22 DIAGNOSIS — S0101XD Laceration without foreign body of scalp, subsequent encounter: Secondary | ICD-10-CM | POA: Diagnosis not present

## 2017-01-22 DIAGNOSIS — E7800 Pure hypercholesterolemia, unspecified: Secondary | ICD-10-CM | POA: Diagnosis not present

## 2017-01-22 DIAGNOSIS — S060X9A Concussion with loss of consciousness of unspecified duration, initial encounter: Secondary | ICD-10-CM | POA: Diagnosis not present

## 2017-01-22 DIAGNOSIS — Z4802 Encounter for removal of sutures: Secondary | ICD-10-CM | POA: Diagnosis not present

## 2017-01-22 DIAGNOSIS — H9390 Unspecified disorder of ear, unspecified ear: Secondary | ICD-10-CM | POA: Diagnosis not present

## 2017-01-22 DIAGNOSIS — Z888 Allergy status to other drugs, medicaments and biological substances status: Secondary | ICD-10-CM | POA: Diagnosis not present

## 2017-01-22 DIAGNOSIS — F17210 Nicotine dependence, cigarettes, uncomplicated: Secondary | ICD-10-CM | POA: Diagnosis not present

## 2017-01-22 DIAGNOSIS — S0181XA Laceration without foreign body of other part of head, initial encounter: Secondary | ICD-10-CM | POA: Diagnosis not present

## 2017-01-22 DIAGNOSIS — W1839XA Other fall on same level, initial encounter: Secondary | ICD-10-CM | POA: Diagnosis not present

## 2017-01-22 DIAGNOSIS — S060X0A Concussion without loss of consciousness, initial encounter: Secondary | ICD-10-CM | POA: Diagnosis not present

## 2017-01-22 DIAGNOSIS — Z7982 Long term (current) use of aspirin: Secondary | ICD-10-CM | POA: Diagnosis not present

## 2017-01-22 DIAGNOSIS — Z880 Allergy status to penicillin: Secondary | ICD-10-CM | POA: Diagnosis not present

## 2017-01-22 DIAGNOSIS — Z79899 Other long term (current) drug therapy: Secondary | ICD-10-CM | POA: Diagnosis not present

## 2017-01-27 ENCOUNTER — Ambulatory Visit: Payer: Medicare Other

## 2017-02-02 DIAGNOSIS — R42 Dizziness and giddiness: Secondary | ICD-10-CM | POA: Diagnosis not present

## 2017-02-02 DIAGNOSIS — H8112 Benign paroxysmal vertigo, left ear: Secondary | ICD-10-CM | POA: Diagnosis not present

## 2017-02-02 DIAGNOSIS — R2689 Other abnormalities of gait and mobility: Secondary | ICD-10-CM | POA: Diagnosis not present

## 2017-02-02 DIAGNOSIS — F0781 Postconcussional syndrome: Secondary | ICD-10-CM | POA: Diagnosis not present

## 2017-02-09 DIAGNOSIS — M25531 Pain in right wrist: Secondary | ICD-10-CM | POA: Diagnosis not present

## 2017-02-09 DIAGNOSIS — S66919A Strain of unspecified muscle, fascia and tendon at wrist and hand level, unspecified hand, initial encounter: Secondary | ICD-10-CM | POA: Diagnosis not present

## 2017-02-09 DIAGNOSIS — S63509A Unspecified sprain of unspecified wrist, initial encounter: Secondary | ICD-10-CM | POA: Diagnosis not present

## 2017-02-09 DIAGNOSIS — H8113 Benign paroxysmal vertigo, bilateral: Secondary | ICD-10-CM | POA: Diagnosis not present

## 2017-02-17 DIAGNOSIS — H8113 Benign paroxysmal vertigo, bilateral: Secondary | ICD-10-CM | POA: Diagnosis not present

## 2017-02-20 DIAGNOSIS — H9319 Tinnitus, unspecified ear: Secondary | ICD-10-CM | POA: Diagnosis not present

## 2017-02-20 DIAGNOSIS — H8110 Benign paroxysmal vertigo, unspecified ear: Secondary | ICD-10-CM | POA: Diagnosis not present

## 2017-02-23 ENCOUNTER — Other Ambulatory Visit: Payer: Self-pay | Admitting: Internal Medicine

## 2017-02-23 DIAGNOSIS — H8113 Benign paroxysmal vertigo, bilateral: Secondary | ICD-10-CM | POA: Diagnosis not present

## 2017-02-26 ENCOUNTER — Ambulatory Visit
Admission: RE | Admit: 2017-02-26 | Discharge: 2017-02-26 | Disposition: A | Discharge status: Home / Self Care | Payer: Medicare Other | Source: Ambulatory Visit | Attending: Internal Medicine | Admitting: Internal Medicine

## 2017-02-26 DIAGNOSIS — M84374D Stress fracture, right foot, subsequent encounter for fracture with routine healing: Secondary | ICD-10-CM | POA: Diagnosis not present

## 2017-02-26 DIAGNOSIS — M810 Age-related osteoporosis without current pathological fracture: Secondary | ICD-10-CM | POA: Diagnosis not present

## 2017-02-26 DIAGNOSIS — E039 Hypothyroidism, unspecified: Secondary | ICD-10-CM | POA: Diagnosis not present

## 2017-02-26 DIAGNOSIS — E2839 Other primary ovarian failure: Secondary | ICD-10-CM | POA: Diagnosis not present

## 2017-02-26 DIAGNOSIS — Z780 Asymptomatic menopausal state: Secondary | ICD-10-CM | POA: Diagnosis not present

## 2017-02-26 DIAGNOSIS — X58XXXD Exposure to other specified factors, subsequent encounter: Secondary | ICD-10-CM | POA: Insufficient documentation

## 2017-02-26 DIAGNOSIS — M818 Other osteoporosis without current pathological fracture: Secondary | ICD-10-CM | POA: Diagnosis not present

## 2017-02-26 LAB — HM DEXA SCAN

## 2017-03-02 DIAGNOSIS — H8112 Benign paroxysmal vertigo, left ear: Secondary | ICD-10-CM | POA: Diagnosis not present

## 2017-03-02 DIAGNOSIS — R42 Dizziness and giddiness: Secondary | ICD-10-CM | POA: Diagnosis not present

## 2017-03-02 DIAGNOSIS — X58XXXA Exposure to other specified factors, initial encounter: Secondary | ICD-10-CM | POA: Diagnosis not present

## 2017-03-02 DIAGNOSIS — R2689 Other abnormalities of gait and mobility: Secondary | ICD-10-CM | POA: Diagnosis not present

## 2017-03-02 DIAGNOSIS — R9082 White matter disease, unspecified: Secondary | ICD-10-CM | POA: Diagnosis not present

## 2017-03-02 DIAGNOSIS — S060X9A Concussion with loss of consciousness of unspecified duration, initial encounter: Secondary | ICD-10-CM | POA: Diagnosis not present

## 2017-03-02 DIAGNOSIS — F0781 Postconcussional syndrome: Secondary | ICD-10-CM | POA: Diagnosis not present

## 2017-03-03 DIAGNOSIS — H8113 Benign paroxysmal vertigo, bilateral: Secondary | ICD-10-CM | POA: Diagnosis not present

## 2017-03-11 DIAGNOSIS — H8113 Benign paroxysmal vertigo, bilateral: Secondary | ICD-10-CM | POA: Diagnosis not present

## 2017-03-12 DIAGNOSIS — S63509A Unspecified sprain of unspecified wrist, initial encounter: Secondary | ICD-10-CM | POA: Diagnosis not present

## 2017-03-12 DIAGNOSIS — M25531 Pain in right wrist: Secondary | ICD-10-CM | POA: Diagnosis not present

## 2017-03-12 DIAGNOSIS — H8113 Benign paroxysmal vertigo, bilateral: Secondary | ICD-10-CM | POA: Diagnosis not present

## 2017-03-12 DIAGNOSIS — S66919A Strain of unspecified muscle, fascia and tendon at wrist and hand level, unspecified hand, initial encounter: Secondary | ICD-10-CM | POA: Diagnosis not present

## 2017-03-16 DIAGNOSIS — H8113 Benign paroxysmal vertigo, bilateral: Secondary | ICD-10-CM | POA: Diagnosis not present

## 2017-03-20 DIAGNOSIS — H8113 Benign paroxysmal vertigo, bilateral: Secondary | ICD-10-CM | POA: Diagnosis not present

## 2017-03-23 DIAGNOSIS — H8113 Benign paroxysmal vertigo, bilateral: Secondary | ICD-10-CM | POA: Diagnosis not present

## 2017-03-25 ENCOUNTER — Other Ambulatory Visit: Payer: Self-pay | Admitting: Internal Medicine

## 2017-03-25 NOTE — Telephone Encounter (Signed)
She has a vitamin D level scheduled to be checked in august.  Would hold on continuing high dose if has been on this dose for a while.  Change to vitamin D3 2000 units per day.  This is over the counter.  We will recheck her vitamin D level with next labs and if needs to be on prescription strength at that time - can prescribe.

## 2017-03-25 NOTE — Telephone Encounter (Signed)
This is a historical med per the chart, there are future labs in the chart, not completed yet.  Please advise for refill. thanks

## 2017-03-26 NOTE — Telephone Encounter (Signed)
Spoke with the patient, she will start the OTC and then have labs in August as planned.  Refused/declined med to the pharmacy.

## 2017-03-30 DIAGNOSIS — H8113 Benign paroxysmal vertigo, bilateral: Secondary | ICD-10-CM | POA: Diagnosis not present

## 2017-04-06 DIAGNOSIS — H8113 Benign paroxysmal vertigo, bilateral: Secondary | ICD-10-CM | POA: Diagnosis not present

## 2017-04-08 DIAGNOSIS — H8113 Benign paroxysmal vertigo, bilateral: Secondary | ICD-10-CM | POA: Diagnosis not present

## 2017-04-10 ENCOUNTER — Other Ambulatory Visit: Payer: Self-pay | Admitting: Internal Medicine

## 2017-04-13 DIAGNOSIS — H8112 Benign paroxysmal vertigo, left ear: Secondary | ICD-10-CM | POA: Diagnosis not present

## 2017-04-13 DIAGNOSIS — F0781 Postconcussional syndrome: Secondary | ICD-10-CM | POA: Diagnosis not present

## 2017-04-13 DIAGNOSIS — R42 Dizziness and giddiness: Secondary | ICD-10-CM | POA: Diagnosis not present

## 2017-04-13 DIAGNOSIS — G479 Sleep disorder, unspecified: Secondary | ICD-10-CM | POA: Diagnosis not present

## 2017-04-14 DIAGNOSIS — H8113 Benign paroxysmal vertigo, bilateral: Secondary | ICD-10-CM | POA: Diagnosis not present

## 2017-04-21 DIAGNOSIS — H8113 Benign paroxysmal vertigo, bilateral: Secondary | ICD-10-CM | POA: Diagnosis not present

## 2017-04-23 ENCOUNTER — Other Ambulatory Visit: Payer: Self-pay | Admitting: Internal Medicine

## 2017-04-28 DIAGNOSIS — H8113 Benign paroxysmal vertigo, bilateral: Secondary | ICD-10-CM | POA: Diagnosis not present

## 2017-05-05 DIAGNOSIS — H8113 Benign paroxysmal vertigo, bilateral: Secondary | ICD-10-CM | POA: Diagnosis not present

## 2017-05-12 DIAGNOSIS — H8113 Benign paroxysmal vertigo, bilateral: Secondary | ICD-10-CM | POA: Diagnosis not present

## 2017-05-13 ENCOUNTER — Other Ambulatory Visit: Payer: Self-pay | Admitting: Internal Medicine

## 2017-05-14 DIAGNOSIS — R42 Dizziness and giddiness: Secondary | ICD-10-CM | POA: Diagnosis not present

## 2017-05-14 DIAGNOSIS — F5104 Psychophysiologic insomnia: Secondary | ICD-10-CM | POA: Diagnosis not present

## 2017-05-14 DIAGNOSIS — F0781 Postconcussional syndrome: Secondary | ICD-10-CM | POA: Diagnosis not present

## 2017-05-14 DIAGNOSIS — G479 Sleep disorder, unspecified: Secondary | ICD-10-CM | POA: Diagnosis not present

## 2017-05-18 ENCOUNTER — Other Ambulatory Visit (INDEPENDENT_AMBULATORY_CARE_PROVIDER_SITE_OTHER): Payer: Medicare Other

## 2017-05-18 DIAGNOSIS — E7800 Pure hypercholesterolemia, unspecified: Secondary | ICD-10-CM | POA: Diagnosis not present

## 2017-05-18 DIAGNOSIS — E559 Vitamin D deficiency, unspecified: Secondary | ICD-10-CM

## 2017-05-18 LAB — BASIC METABOLIC PANEL
BUN: 10 mg/dL (ref 6–23)
CALCIUM: 9.6 mg/dL (ref 8.4–10.5)
CO2: 29 meq/L (ref 19–32)
CREATININE: 0.78 mg/dL (ref 0.40–1.20)
Chloride: 107 mEq/L (ref 96–112)
GFR: 78.56 mL/min (ref >60.00)
Glucose, Bld: 92 mg/dL (ref 70–99)
Potassium: 5.2 mEq/L — ABNORMAL HIGH (ref 3.5–5.1)
Sodium: 141 mEq/L (ref 135–145)

## 2017-05-18 LAB — HEPATIC FUNCTION PANEL
ALBUMIN: 4.4 g/dL (ref 3.5–5.2)
ALT: 22 U/L (ref 0–35)
AST: 19 U/L (ref 0–37)
Alkaline Phosphatase: 62 U/L (ref 39–117)
Bilirubin, Direct: 0.1 mg/dL (ref 0.0–0.3)
Total Bilirubin: 0.6 mg/dL (ref 0.2–1.2)
Total Protein: 6.9 g/dL (ref 6.0–8.3)

## 2017-05-18 LAB — LIPID PANEL
CHOLESTEROL: 169 mg/dL (ref 0–200)
HDL: 63.8 mg/dL (ref >39.00)
LDL Cholesterol: 87 mg/dL (ref 0–99)
NonHDL: 105.43
Total CHOL/HDL Ratio: 3
Triglycerides: 92 mg/dL (ref 0.0–149.0)
VLDL: 18.4 mg/dL (ref 0.0–40.0)

## 2017-05-18 LAB — VITAMIN D 25 HYDROXY (VIT D DEFICIENCY, FRACTURES): VITD: 86.94 ng/mL (ref 30.00–100.00)

## 2017-05-20 ENCOUNTER — Ambulatory Visit (INDEPENDENT_AMBULATORY_CARE_PROVIDER_SITE_OTHER): Payer: Medicare Other | Admitting: Internal Medicine

## 2017-05-20 ENCOUNTER — Encounter: Payer: Self-pay | Admitting: Internal Medicine

## 2017-05-20 VITALS — BP 118/60 | HR 82 | Temp 98.6°F | Resp 12 | Ht 60.0 in | Wt 114.4 lb

## 2017-05-20 DIAGNOSIS — S060X0D Concussion without loss of consciousness, subsequent encounter: Secondary | ICD-10-CM | POA: Diagnosis not present

## 2017-05-20 DIAGNOSIS — E7800 Pure hypercholesterolemia, unspecified: Secondary | ICD-10-CM | POA: Diagnosis not present

## 2017-05-20 DIAGNOSIS — E875 Hyperkalemia: Secondary | ICD-10-CM | POA: Diagnosis not present

## 2017-05-20 DIAGNOSIS — F419 Anxiety disorder, unspecified: Secondary | ICD-10-CM

## 2017-05-20 DIAGNOSIS — Z720 Tobacco use: Secondary | ICD-10-CM

## 2017-05-20 DIAGNOSIS — Z0000 Encounter for general adult medical examination without abnormal findings: Secondary | ICD-10-CM | POA: Diagnosis not present

## 2017-05-20 DIAGNOSIS — M8580 Other specified disorders of bone density and structure, unspecified site: Secondary | ICD-10-CM

## 2017-05-20 DIAGNOSIS — E039 Hypothyroidism, unspecified: Secondary | ICD-10-CM

## 2017-05-20 LAB — POTASSIUM: POTASSIUM: 4.8 meq/L (ref 3.5–5.1)

## 2017-05-20 MED ORDER — LEVOTHYROXINE SODIUM 88 MCG PO TABS: ORAL_TABLET | ORAL | 6 refills | Status: DC

## 2017-05-20 MED ORDER — ATORVASTATIN CALCIUM 20 MG PO TABS: 20.0000 mg | ORAL_TABLET | Freq: Every day | ORAL | 6 refills | Status: DC

## 2017-05-20 NOTE — Progress Notes (Signed)
Patient ID: Leah Benitez, female   DOB: 1951-02-16, 66 y.o.   MRN: [id number]   Subjective:    Patient ID: Leah Benitez, female    DOB: 15-Mar-1951, 66 y.o.   MRN: [id number]  HPI  Patient here for a scheduled follow up.  She recently fell.  Her dog knocked her over.  She hit her head on a brick planter.  Had 8 staples.  Diagnosed with a concussion.  Initially with increased dizziness and nausea.  CT negative for bleed.  Seeing neurology.  Also saw ENT.  Had inner ear issues.  Had what sounds like Epley maneuvers.  Also going to therapy.  Is better.  She recently had f/u with neurology.  Started on clonazepam.  Has helped.  Was also given a rx for pristiq.  Has not started and does not want to start.  No chest pain.  No sob.  No acid reflux.  No abdominal pain.  Bowels moving.  Discussed her bone density results.  Discussed treatment options.  She did not tolerate actonel.  Made her sick on her stomach.     Past Medical History:  Diagnosis Date  . Allergy   . Hyperlipidemia   . Thyroid disease    Past Surgical History:  Procedure Laterality Date  . ABDOMINAL HYSTERECTOMY     previous abnormal pap smear  . BREAST BIOPSY    . TONSILLECTOMY     Family History  Problem Relation Age of Onset  . Arthritis Mother   . Stroke Mother   . Hypertension Mother   . Arthritis Father   . Heart disease Father   . Heart disease Brother   . Cancer Maternal Aunt        breast   Social History   Social History  . Marital status: Married    Spouse name: N/A  . Number of children: N/A  . Years of education: N/A   Social History Main Topics  . Smoking status: Current Every Day Smoker    Packs/day: 1.00    Years: 45.00    Types: Cigarettes  . Smokeless tobacco: Never Used  . Alcohol use None  . Drug use: Unknown  . Sexual activity: Not Asked   Other Topics Concern  . None   Social History Narrative  . None    Outpatient Encounter Prescriptions as of 05/20/2017  Medication Sig  .  atorvastatin (LIPITOR) 20 MG tablet Take 1 tablet (20 mg total) by mouth daily.  . cholecalciferol (VITAMIN D) 1000 units tablet Take 2,000 Units by mouth daily.  . clonazePAM (KLONOPIN) 0.5 MG tablet Take 0.5 mg by mouth 2 (two) times daily as needed for anxiety (1/2 tab qd).  . Esomeprazole Magnesium (NEXIUM PO) Take by mouth.  . levothyroxine (SYNTHROID, LEVOTHROID) 88 MCG tablet TAKE 1 TABLET IN THE MORNING FOR THYROID  . [DISCONTINUED] atorvastatin (LIPITOR) 20 MG tablet Take 20 mg by mouth daily.  . [DISCONTINUED] levothyroxine (SYNTHROID, LEVOTHROID) 88 MCG tablet TAKE 1 TABLET IN THE MORNING FOR THYROID  . [DISCONTINUED] doxycycline (VIBRA-TABS) 100 MG tablet Take 1 tablet (100 mg total) by mouth 2 (two) times daily.  . [DISCONTINUED] ergocalciferol (VITAMIN D2) 50000 units capsule Take 50,000 Units by mouth once a week.   No facility-administered encounter medications on file as of 05/20/2017.     Review of Systems  Constitutional: Negative for appetite change and unexpected weight change.  HENT: Negative for congestion and sinus pressure.   Respiratory: Negative for cough, chest tightness and shortness  of breath.   Cardiovascular: Negative for chest pain, palpitations and leg swelling.  Gastrointestinal: Negative for abdominal pain, diarrhea, nausea and vomiting.  Genitourinary: Negative for difficulty urinating and dysuria.  Musculoskeletal: Negative for back pain and joint swelling.  Skin: Negative for color change and rash.  Neurological: Positive for dizziness and light-headedness. Negative for headaches.  Psychiatric/Behavioral: Negative for agitation and dysphoric mood.       Objective:    Physical Exam  Constitutional: She appears well-developed and well-nourished. No distress.  HENT:  Nose: Nose normal.  Mouth/Throat: Oropharynx is clear and moist.  Neck: Neck supple. No thyromegaly present.  Cardiovascular: Normal rate and regular rhythm.   Pulmonary/Chest: Breath  sounds normal. No respiratory distress. She has no wheezes.  Abdominal: Soft. Bowel sounds are normal. There is no tenderness.  Musculoskeletal: She exhibits no edema or tenderness.  Lymphadenopathy:    She has no cervical adenopathy.  Skin: No rash noted. No erythema.  Psychiatric: She has a normal mood and affect. Her behavior is normal.    BP 118/60 (BP Location: Left Arm, Patient Position: Sitting, Cuff Size: Normal)   Pulse 82   Temp 98.6 F (37 C) (Oral)   Resp 12   Ht 5' (1.524 m)   Wt 114 lb 6.4 oz (51.9 kg)   SpO2 97%   BMI 22.34 kg/m  Wt Readings from Last 3 Encounters:  05/20/17 114 lb 6.4 oz (51.9 kg)  11/17/16 111 lb (50.3 kg)  11/11/16 115 lb (52.2 kg)     Lab Results  Component Value Date   WBC 10.8 (H) 09/12/2016   HGB 14.2 09/12/2016   HCT 41.5 09/12/2016   PLT 279.0 09/12/2016   GLUCOSE 92 05/18/2017   CHOL 169 05/18/2017   TRIG 92.0 05/18/2017   HDL 63.80 05/18/2017   LDLCALC 87 05/18/2017   ALT 22 05/18/2017   AST 19 05/18/2017   NA 141 05/18/2017   K 4.8 05/20/2017   CL 107 05/18/2017   CREATININE 0.78 05/18/2017   BUN 10 05/18/2017   CO2 29 05/18/2017   TSH 2.31 08/21/2016    Dg Bone Density  Result Date: 02/26/2017 EXAM: DUAL X-RAY ABSORPTIOMETRY (DXA) FOR BONE MINERAL DENSITY IMPRESSION: Dear Dr. Einar Pheasant, Your patient Leah Benitez completed a BMD test on 02/26/2017 using the [place] (analysis version: 14.10) manufactured by [employer]. The following summarizes the results of our evaluation. PATIENT BIOGRAPHICAL: Name: Leah, Benitez Patient ID: [id number] Birth Date: 07/01/1951 Height: 60.0 in. Gender: Female Exam Date: 02/26/2017 Weight: 111.0 lbs. Indications: Caucasian, Family Hx of Osteoporosis, Hypothyroid, Hysterectomy, Postmenopausal Fractures: Treatments: Calcium, Synthroid, Vitamin D ASSESSMENT: The BMD measured at Femur Neck Right is 0.697 g/cm2 with a T-score of -2.5. This patient is considered osteoporotic  according to [facility]) criteria. Site Region Measured Measured WHO Young Adult BMD Date       Age      Classification T-score AP Spine L1-L4 02/26/2017 65.6 Osteopenia -2.2 0.927 g/cm2 DualFemur Neck Right 02/26/2017 65.6 Osteoporosis -2.5 0.697 g/cm2 World Health Organization [facility]) criteria for post-menopausal, Caucasian Women: Normal:       T-score at or above -1 SD Osteopenia:   T-score between -1 and -2.5 SD Osteoporosis: T-score at or below -2.5 SD RECOMMENDATIONS: [place] recommends that FDA-approved medical therapies be considered in postmenopausal women and men age 14 or older with a: 1. Hip or vertebral (clinical or morphometric) fracture. 2. T-score of < -2.5 at the spine or hip.  3. Ten-year fracture probability by FRAX of 3% or greater for hip fracture or 20% or greater for major osteoporotic fracture. All treatment decisions require clinical judgment and consideration of individual patient factors, including patient preferences, co-morbidities, previous drug use, risk factors not captured in the FRAX model (e.g. falls, vitamin D deficiency, increased bone turnover, interval significant decline in bone density) and possible under - or over-estimation of fracture risk by FRAX. All patients should ensure an adequate intake of dietary calcium (1200 mg/d) and vitamin D (800 IU daily) unless contraindicated. FOLLOW-UP: People with diagnosed cases of osteoporosis or at high risk for fracture should have regular bone mineral density tests. For patients eligible for Medicare, routine testing is allowed once every 2 years. The testing frequency can be increased to one year for patients who have rapidly progressing disease, those who are receiving or discontinuing medical therapy to restore bone mass, or have additional risk factors. I have reviewed this report, and agree with the above findings. [facility] Electronically Signed   By: Nolon Nations M.D.    On: 02/26/2017 10:37       Assessment & Plan:   Problem List Items Addressed This Visit    Anxiety    Doing better on clonazepam.  Follow.        Concussion    Recent fall as outlined.  Seeing neurology.  Also saw ENT.  Going to therapy.  Doing better.  Follow.        Healthcare maintenance - Primary   Hypercholesterolemia    On lipitor.  Low cholesterol diet and exercise.  Follow lipid panel and liver function tests.        Relevant Medications   atorvastatin (LIPITOR) 20 MG tablet   Hypothyroidism    On thyroid replacement.  Follow tsh.        Relevant Medications   levothyroxine (SYNTHROID, LEVOTHROID) 88 MCG tablet   Osteopenia    Reviewed bone density.  Discussed treatment options.  Had intolerance to actonel.  Discussed reclast and prolia.  She wants to hold on starting a new treatment at this time.  Follow.  Discussed weight bearing exercise.        Tobacco abuse    Have discussed the need to stop smoking.  Follow.        Other Visit Diagnoses    Hyperkalemia       Recheck potassium.    Relevant Orders   Potassium (Completed)       Einar Pheasant, MD

## 2017-05-22 ENCOUNTER — Encounter: Payer: Self-pay | Admitting: Internal Medicine

## 2017-05-22 DIAGNOSIS — M8580 Other specified disorders of bone density and structure, unspecified site: Secondary | ICD-10-CM | POA: Insufficient documentation

## 2017-05-22 DIAGNOSIS — M810 Age-related osteoporosis without current pathological fracture: Secondary | ICD-10-CM | POA: Insufficient documentation

## 2017-05-22 DIAGNOSIS — S92515A Nondisplaced fracture of proximal phalanx of left lesser toe(s), initial encounter for closed fracture: Secondary | ICD-10-CM | POA: Diagnosis not present

## 2017-05-22 DIAGNOSIS — S060X9A Concussion with loss of consciousness of unspecified duration, initial encounter: Secondary | ICD-10-CM | POA: Insufficient documentation

## 2017-05-22 DIAGNOSIS — M19072 Primary osteoarthritis, left ankle and foot: Secondary | ICD-10-CM | POA: Diagnosis not present

## 2017-05-22 DIAGNOSIS — F419 Anxiety disorder, unspecified: Secondary | ICD-10-CM | POA: Insufficient documentation

## 2017-05-22 DIAGNOSIS — S060XAA Concussion with loss of consciousness status unknown, initial encounter: Secondary | ICD-10-CM | POA: Insufficient documentation

## 2017-05-22 DIAGNOSIS — M79672 Pain in left foot: Secondary | ICD-10-CM | POA: Diagnosis not present

## 2017-05-22 NOTE — Assessment & Plan Note (Signed)
Recent fall as outlined.  Seeing neurology.  Also saw ENT.  Going to therapy.  Doing better.  Follow.

## 2017-05-22 NOTE — Assessment & Plan Note (Signed)
Doing better on clonazepam.  Follow.

## 2017-05-22 NOTE — Assessment & Plan Note (Signed)
On thyroid replacement.  Follow tsh.  

## 2017-05-22 NOTE — Assessment & Plan Note (Signed)
Reviewed bone density.  Discussed treatment options.  Had intolerance to actonel.  Discussed reclast and prolia.  She wants to hold on starting a new treatment at this time.  Follow.  Discussed weight bearing exercise.

## 2017-05-22 NOTE — Assessment & Plan Note (Signed)
Have discussed the need to stop smoking.  Follow.

## 2017-05-22 NOTE — Assessment & Plan Note (Signed)
On lipitor.  Low cholesterol diet and exercise.  Follow lipid panel and liver function tests.   

## 2017-06-23 DIAGNOSIS — F5104 Psychophysiologic insomnia: Secondary | ICD-10-CM | POA: Diagnosis not present

## 2017-06-23 DIAGNOSIS — F0781 Postconcussional syndrome: Secondary | ICD-10-CM | POA: Diagnosis not present

## 2017-06-23 DIAGNOSIS — R42 Dizziness and giddiness: Secondary | ICD-10-CM | POA: Diagnosis not present

## 2017-06-23 DIAGNOSIS — H8112 Benign paroxysmal vertigo, left ear: Secondary | ICD-10-CM | POA: Diagnosis not present

## 2017-06-29 DIAGNOSIS — R0981 Nasal congestion: Secondary | ICD-10-CM | POA: Diagnosis not present

## 2017-06-29 DIAGNOSIS — H66003 Acute suppurative otitis media without spontaneous rupture of ear drum, bilateral: Secondary | ICD-10-CM | POA: Diagnosis not present

## 2017-06-29 DIAGNOSIS — R51 Headache: Secondary | ICD-10-CM | POA: Diagnosis not present

## 2017-07-06 ENCOUNTER — Ambulatory Visit: Payer: Medicare Other | Admitting: Internal Medicine

## 2017-07-16 DIAGNOSIS — H8112 Benign paroxysmal vertigo, left ear: Secondary | ICD-10-CM | POA: Diagnosis not present

## 2017-07-16 DIAGNOSIS — R42 Dizziness and giddiness: Secondary | ICD-10-CM | POA: Diagnosis not present

## 2017-07-16 DIAGNOSIS — J019 Acute sinusitis, unspecified: Secondary | ICD-10-CM | POA: Diagnosis not present

## 2017-07-24 ENCOUNTER — Ambulatory Visit: Payer: Medicare Other | Admitting: Internal Medicine

## 2017-08-10 ENCOUNTER — Encounter: Payer: Self-pay | Admitting: Internal Medicine

## 2017-08-10 ENCOUNTER — Ambulatory Visit (INDEPENDENT_AMBULATORY_CARE_PROVIDER_SITE_OTHER): Payer: Medicare Other | Admitting: Internal Medicine

## 2017-08-10 VITALS — BP 120/62 | HR 72 | Temp 98.6°F | Resp 18 | Ht 60.0 in | Wt 115.0 lb

## 2017-08-10 DIAGNOSIS — E039 Hypothyroidism, unspecified: Secondary | ICD-10-CM | POA: Diagnosis not present

## 2017-08-10 DIAGNOSIS — R2681 Unsteadiness on feet: Secondary | ICD-10-CM

## 2017-08-10 DIAGNOSIS — E7800 Pure hypercholesterolemia, unspecified: Secondary | ICD-10-CM

## 2017-08-10 DIAGNOSIS — R42 Dizziness and giddiness: Secondary | ICD-10-CM

## 2017-08-10 DIAGNOSIS — F419 Anxiety disorder, unspecified: Secondary | ICD-10-CM

## 2017-08-10 NOTE — Progress Notes (Signed)
Patient ID: Leah Benitez, female   DOB: 09-03-1951, 66 y.o.   MRN: [id number]   Subjective:    Patient ID: Leah Benitez, female    DOB: 1951/02/14, 66 y.o.   MRN: [id number]  HPI  Patient here for a scheduled follow up.  Last visit she had fallen and hit her head on a brick planter.  Was seen at outside ER.  Diagnosed with a concussion.  Initially with increased dizziness and nausea.  CT negative for bleed.  Saw neurology and ENT.  Had inner ear issues.  S/p Epley maneuvers.  Was better.  See last note for details.  She then had another episode where she noticed room spinning.  Increased sinus pressure.  Saw Dr Leah Benitez.  Was given abx and what sounds like epley maneuvers.  States sinus symptoms cleared and she felt much better.  Was doing ok until 5 days ago.  States she got out of bed and felt off balance.  Did not notice any symptoms while lying in bed.  Reports this episode is different.  Denies room spinning.  States is worse in am.  No symptoms in bed.  No symptoms with rolling over.  Does report when stands, symptoms begin.  May last 1-2 hours in the am and then is able to get around better, but still not back to normal.  Symptoms worsened over the weekend and she had to crawl to the bathroom.  Took meclizine.  States that today and yesterday, she has had an uneasy feeling.  Not as bad as previous.  Still unsteady.  Ears feel full.  Has constant ringing in her ears, but his is unchanged.  No headache.  No fever.  Sinus symptoms better.  No chest pain.  No sob.  No increased heart rate or palpitations.  No nausea or vomiting.  Bowels. Moving.     Past Medical History:  Diagnosis Date  . Allergy   . Hyperlipidemia   . Thyroid disease    Past Surgical History:  Procedure Laterality Date  . ABDOMINAL HYSTERECTOMY     previous abnormal pap smear  . BREAST BIOPSY    . TONSILLECTOMY     Family History  Problem Relation Age of Onset  . Arthritis Mother   . Stroke Mother   . Hypertension Mother     . Arthritis Father   . Heart disease Father   . Heart disease Brother   . Cancer Maternal Aunt        breast   Social History   Social History  . Marital status: Married    Spouse name: N/A  . Number of children: N/A  . Years of education: N/A   Social History Main Topics  . Smoking status: Current Every Day Smoker    Packs/day: 1.00    Years: 45.00    Types: Cigarettes  . Smokeless tobacco: Never Used  . Alcohol use None  . Drug use: Unknown  . Sexual activity: Not Asked   Other Topics Concern  . None   Social History Narrative  . None    Outpatient Encounter Prescriptions as of 08/10/2017  Medication Sig  . atorvastatin (LIPITOR) 20 MG tablet Take 1 tablet (20 mg total) by mouth daily.  . cholecalciferol (VITAMIN D) 1000 units tablet Take 2,000 Units by mouth daily.  . clonazePAM (KLONOPIN) 0.5 MG tablet Take 0.5 mg by mouth 2 (two) times daily as needed for anxiety (1/2 tab qd).  . Esomeprazole Magnesium (NEXIUM PO) Take by mouth.  Marland Kitchen  fluticasone (FLONASE) 50 MCG/ACT nasal spray Place 2 sprays into both nostrils daily.  Mar[place] Kitchen levothyroxine (SYNTHROID, LEVOTHROID) 88 MCG tablet TAKE 1 TABLET IN THE MORNING FOR THYROID   No facility-administered encounter medications on file as of 08/10/2017.     Review of Systems  Constitutional: Negative for fever and unexpected weight change.       Some decreased appetite, but is eating.    HENT: Negative for sore throat.        Previous sinus symptoms improved.  Still with fullness as outlined.    Respiratory: Negative for cough, chest tightness and shortness of breath.   Cardiovascular: Negative for chest pain, palpitations and leg swelling.  Gastrointestinal: Negative for abdominal pain, diarrhea and vomiting.  Musculoskeletal: Negative for joint swelling and myalgias.  Skin: Negative for color change and rash.  Neurological: Negative for headaches.       Unsteadiness as outlined.    Psychiatric/Behavioral: Negative for  agitation and dysphoric mood.       Objective:     Blood pressure rechecked by me:  132/76  Physical Exam  Constitutional: She appears well-developed and well-nourished. No distress.  HENT:  Nose: Nose normal.  Mouth/Throat: Oropharynx is clear and moist.  TMs without erythema.   Neck: Neck supple. No thyromegaly present.  Cardiovascular: Normal rate and regular rhythm.   Pulmonary/Chest: Breath sounds normal. No respiratory distress. She has no wheezes.  Abdominal: Soft. Bowel sounds are normal. There is no tenderness.  Musculoskeletal: She exhibits no edema or tenderness.  Lymphadenopathy:    She has no cervical adenopathy.  Skin: No rash noted. No erythema.  Psychiatric: She has a normal mood and affect. Her behavior is normal.    BP 120/62 (BP Location: Left Arm, Patient Position: Sitting, Cuff Size: Normal)   Pulse 72   Temp 98.6 F (37 C) (Oral)   Resp 18   Ht 5' (1.524 m)   Wt 115 lb (52.2 kg)   SpO2 98%   BMI 22.46 kg/m  Wt Readings from Last 3 Encounters:  08/10/17 115 lb (52.2 kg)  05/20/17 114 lb 6.4 oz (51.9 kg)  11/17/16 111 lb (50.3 kg)     Lab Results  Component Value Date   WBC 10.8 (H) 09/12/2016   HGB 14.2 09/12/2016   HCT 41.5 09/12/2016   PLT 279.0 09/12/2016   GLUCOSE 92 05/18/2017   CHOL 169 05/18/2017   TRIG 92.0 05/18/2017   HDL 63.80 05/18/2017   LDLCALC 87 05/18/2017   ALT 22 05/18/2017   AST 19 05/18/2017   NA 141 05/18/2017   K 4.8 05/20/2017   CL 107 05/18/2017   CREATININE 0.78 05/18/2017   BUN 10 05/18/2017   CO2 29 05/18/2017   TSH 2.31 08/21/2016    Dg Bone Density  Result Date: 02/26/2017 EXAM: DUAL X-RAY ABSORPTIOMETRY (DXA) FOR BONE MINERAL DENSITY IMPRESSION: Dear Dr. Einar Pheasant, Your patient Leah Benitez completed a BMD test on 02/26/2017 using the [place] (analysis version: 14.10) manufactured by [employer]. The following summarizes the results of our evaluation. PATIENT BIOGRAPHICAL: Name:  Leah Benitez Patient ID: [id number] Birth Date: [birth year] Height: 60.0 in. Gender: Female Exam Date: 02/26/2017 Weight: 111.0 lbs. Indications: Caucasian, Family Hx of Osteoporosis, Hypothyroid, Hysterectomy, Postmenopausal Fractures: Treatments: Calcium, Synthroid, Vitamin D ASSESSMENT: The BMD measured at Femur Neck Right is 0.697 g/cm2 with a T-score of -2.5. This patient is considered osteoporotic according to [facility]) criteria. Site Region Measured Measured WHO Young Adult  BMD Date       Age      Classification T-score AP Spine L1-L4 02/26/2017 65.6 Osteopenia -2.2 0.927 g/cm2 DualFemur Neck Right 02/26/2017 65.6 Osteoporosis -2.5 0.697 g/cm2 World Health Organization [facility] criteria for post-menopausal, Caucasian Women: Normal:       T-score at or above -1 SD Osteopenia:   T-score between -1 and -2.5 SD Osteoporosis: T-score at or below -2.5 SD RECOMMENDATIONS: [place] recommends that FDA-approved medical therapies be considered in postmenopausal women and men age 12 or older with a: 1. Hip or vertebral (clinical or morphometric) fracture. 2. T-score of < -2.5 at the spine or hip. 3. Ten-year fracture probability by FRAX of 3% or greater for hip fracture or 20% or greater for major osteoporotic fracture. All treatment decisions require clinical judgment and consideration of individual patient factors, including patient preferences, co-morbidities, previous drug use, risk factors not captured in the FRAX model (e.g. falls, vitamin D deficiency, increased bone turnover, interval significant decline in bone density) and possible under - or over-estimation of fracture risk by FRAX. All patients should ensure an adequate intake of dietary calcium (1200 mg/d) and vitamin D (800 IU daily) unless contraindicated. FOLLOW-UP: People with diagnosed cases of osteoporosis or at high risk for fracture should have regular bone mineral density tests. For patients eligible for  Medicare, routine testing is allowed once every 2 years. The testing frequency can be increased to one year for patients who have rapidly progressing disease, those who are receiving or discontinuing medical therapy to restore bone mass, or have additional risk factors. I have reviewed this report, and agree with the above findings. [facility] Electronically Signed   By: Nolon Nations M.D.   On: 02/26/2017 10:37       Assessment & Plan:   Problem List Items Addressed This Visit    Anxiety    On clonazepam.  Overall stable.        Hypercholesterolemia    On lipitor.  Low cholesterol diet and exercise.  Follow lipid panel and liver function tests.        Relevant Orders   Hepatic function panel   Lipid panel   Hypothyroidism    On thyroid replacement.  Follow tsh.        Relevant Orders   TSH   Unsteadiness    Had the recent issues with a concussion and vertigo as outlined.  Recently saw ENT and treated for sinus infection and also with what sounds like Epley maneuvers.  With increased head fullness and unsteadiness now.  Denies actual room spinning.  Is aggravated once she is up and moving in the morning.  Not worsened by rolling over in the bed.  Sinus symptoms have improved.  No headache.  No fever.  No other neurological changes.  Will have her use saline nasal spray and steroid nasal spray and restart mucinex.  Hold abx.  Discussed the need for f/u with ENT.  She is in agreement.  Discussed my concern regarding that this episode is different from recent symptoms.  Discussed further w/up and evaluation.  She is taking aspirin daily.  Discussed repeat MRI.  She wants to hold on this.  Did agree to carotid ultrasound.  Wants ENT evaluation first and then will pursue further testing if warranted.         Other Visit Diagnoses    Unsteady gait    -  Primary   Relevant Orders   VAS US CAROTID  Dizziness       Relevant Orders   VAS US CAROTID   Ambulatory referral to ENT    CBC with Differential/Platelet   Basic metabolic panel      Einar Pheasant, MD

## 2017-08-10 NOTE — Patient Instructions (Signed)
Continue nasal spray  Restart mucinex

## 2017-08-11 ENCOUNTER — Encounter: Payer: Self-pay | Admitting: Internal Medicine

## 2017-08-11 DIAGNOSIS — R2681 Unsteadiness on feet: Secondary | ICD-10-CM

## 2017-08-11 DIAGNOSIS — R42 Dizziness and giddiness: Secondary | ICD-10-CM | POA: Insufficient documentation

## 2017-08-11 NOTE — Assessment & Plan Note (Signed)
Had the recent issues with a concussion and vertigo as outlined.  Recently saw ENT and treated for sinus infection and also with what sounds like Epley maneuvers.  With increased head fullness and unsteadiness now.  Denies actual room spinning.  Is aggravated once she is up and moving in the morning.  Not worsened by rolling over in the bed.  Sinus symptoms have improved.  No headache.  No fever.  No other neurological changes.  Will have her use saline nasal spray and steroid nasal spray and restart mucinex.  Hold abx.  Discussed the need for f/u with ENT.  She is in agreement.  Discussed my concern regarding that this episode is different from recent symptoms.  Discussed further w/up and evaluation.  She is taking aspirin daily.  Discussed repeat MRI.  She wants to hold on this.  Did agree to carotid ultrasound.  Wants ENT evaluation first and then will pursue further testing if warranted.

## 2017-08-11 NOTE — Assessment & Plan Note (Signed)
On clonazepam.  Overall stable.

## 2017-08-11 NOTE — Assessment & Plan Note (Signed)
On thyroid replacement.  Follow tsh.  

## 2017-08-11 NOTE — Assessment & Plan Note (Signed)
On lipitor.  Low cholesterol diet and exercise.  Follow lipid panel and liver function tests.   

## 2017-08-14 ENCOUNTER — Telehealth: Payer: Self-pay | Admitting: Internal Medicine

## 2017-08-14 NOTE — Telephone Encounter (Signed)
Here is the phone note that was sent.

## 2017-08-14 NOTE — Telephone Encounter (Signed)
I may be going about this wrong, but I could not find her CRM regarding this. She has rescheduled herself for PT for Monday 11/5

## 2017-08-14 NOTE — Telephone Encounter (Signed)
Please advise 

## 2017-08-14 NOTE — Telephone Encounter (Signed)
Copied from [place] #[id number]. Topic: Quick Communication - See Telephone Encounter >> Aug 14, 2017  9:09 AM Robina Ade, Helene Kelp D wrote: CRM for notification. See Telephone encounter for: 08/14/17. Patient needs to talk to someone about her referral to ENT. She still is feeing dizzy and drunk, she doesn't know what to do. Please call patient back, thanks.

## 2017-08-17 ENCOUNTER — Telehealth: Payer: Self-pay

## 2017-08-17 DIAGNOSIS — H8112 Benign paroxysmal vertigo, left ear: Secondary | ICD-10-CM | POA: Diagnosis not present

## 2017-08-17 DIAGNOSIS — M47892 Other spondylosis, cervical region: Secondary | ICD-10-CM | POA: Diagnosis not present

## 2017-08-17 DIAGNOSIS — Z9181 History of falling: Secondary | ICD-10-CM | POA: Diagnosis not present

## 2017-08-17 DIAGNOSIS — Z880 Allergy status to penicillin: Secondary | ICD-10-CM | POA: Diagnosis not present

## 2017-08-17 DIAGNOSIS — E039 Hypothyroidism, unspecified: Secondary | ICD-10-CM | POA: Diagnosis not present

## 2017-08-17 DIAGNOSIS — E559 Vitamin D deficiency, unspecified: Secondary | ICD-10-CM | POA: Diagnosis not present

## 2017-08-17 DIAGNOSIS — F17210 Nicotine dependence, cigarettes, uncomplicated: Secondary | ICD-10-CM | POA: Diagnosis not present

## 2017-08-17 DIAGNOSIS — Z888 Allergy status to other drugs, medicaments and biological substances status: Secondary | ICD-10-CM | POA: Diagnosis not present

## 2017-08-17 DIAGNOSIS — D72829 Elevated white blood cell count, unspecified: Secondary | ICD-10-CM | POA: Diagnosis not present

## 2017-08-17 DIAGNOSIS — R270 Ataxia, unspecified: Secondary | ICD-10-CM | POA: Diagnosis not present

## 2017-08-17 DIAGNOSIS — E875 Hyperkalemia: Secondary | ICD-10-CM | POA: Diagnosis not present

## 2017-08-17 DIAGNOSIS — Z7982 Long term (current) use of aspirin: Secondary | ICD-10-CM | POA: Diagnosis not present

## 2017-08-17 DIAGNOSIS — H8110 Benign paroxysmal vertigo, unspecified ear: Secondary | ICD-10-CM | POA: Diagnosis not present

## 2017-08-17 DIAGNOSIS — E785 Hyperlipidemia, unspecified: Secondary | ICD-10-CM | POA: Diagnosis not present

## 2017-08-17 DIAGNOSIS — Z79899 Other long term (current) drug therapy: Secondary | ICD-10-CM | POA: Diagnosis not present

## 2017-08-17 DIAGNOSIS — J302 Other seasonal allergic rhinitis: Secondary | ICD-10-CM | POA: Diagnosis not present

## 2017-08-17 DIAGNOSIS — Z9889 Other specified postprocedural states: Secondary | ICD-10-CM | POA: Diagnosis not present

## 2017-08-17 DIAGNOSIS — R42 Dizziness and giddiness: Secondary | ICD-10-CM | POA: Diagnosis not present

## 2017-08-17 DIAGNOSIS — K219 Gastro-esophageal reflux disease without esophagitis: Secondary | ICD-10-CM | POA: Diagnosis not present

## 2017-08-17 NOTE — Telephone Encounter (Signed)
Copied from [place] [id number]. Topic: Inquiry >> Aug 17, 2017 10:04 AM Conception Chancy, NT wrote: Reason for CRM: pt would like Dr. Nicki Reaper to right her a RX for a CT scan of the sinuses and a MRI for the brain. RX would go to [facility].

## 2017-08-17 NOTE — Telephone Encounter (Signed)
Noted.  Please f/u with her after visit to confirm ok and definitely was evaluated and then we can f/u with testing if needs to be scheduled

## 2017-08-17 NOTE — Telephone Encounter (Signed)
Looks like you had talked about repeat MIR on o/v from 08/10/17

## 2017-08-17 NOTE — Telephone Encounter (Signed)
Please call her and clarify symptoms she is having.  If she is getting worse and more unsteady, she needs to go to ER for evaluation to confirm etiology.

## 2017-08-17 NOTE — Telephone Encounter (Signed)
LMTCB on her cell phone and spoke with her husband about taking her to the ED. She has not got home from [place] yet.

## 2017-08-18 DIAGNOSIS — E875 Hyperkalemia: Secondary | ICD-10-CM | POA: Diagnosis not present

## 2017-08-18 DIAGNOSIS — M47892 Other spondylosis, cervical region: Secondary | ICD-10-CM | POA: Diagnosis not present

## 2017-08-18 DIAGNOSIS — E559 Vitamin D deficiency, unspecified: Secondary | ICD-10-CM | POA: Diagnosis not present

## 2017-08-18 DIAGNOSIS — R42 Dizziness and giddiness: Secondary | ICD-10-CM | POA: Diagnosis not present

## 2017-08-18 DIAGNOSIS — J302 Other seasonal allergic rhinitis: Secondary | ICD-10-CM | POA: Diagnosis not present

## 2017-08-18 DIAGNOSIS — R2689 Other abnormalities of gait and mobility: Secondary | ICD-10-CM | POA: Diagnosis not present

## 2017-08-18 DIAGNOSIS — F5104 Psychophysiologic insomnia: Secondary | ICD-10-CM | POA: Diagnosis not present

## 2017-08-18 DIAGNOSIS — E785 Hyperlipidemia, unspecified: Secondary | ICD-10-CM | POA: Diagnosis not present

## 2017-08-18 DIAGNOSIS — K219 Gastro-esophageal reflux disease without esophagitis: Secondary | ICD-10-CM | POA: Diagnosis not present

## 2017-08-18 DIAGNOSIS — E039 Hypothyroidism, unspecified: Secondary | ICD-10-CM | POA: Diagnosis not present

## 2017-08-18 DIAGNOSIS — D72829 Elevated white blood cell count, unspecified: Secondary | ICD-10-CM | POA: Diagnosis not present

## 2017-08-18 DIAGNOSIS — F0781 Postconcussional syndrome: Secondary | ICD-10-CM | POA: Diagnosis not present

## 2017-08-18 DIAGNOSIS — H8112 Benign paroxysmal vertigo, left ear: Secondary | ICD-10-CM | POA: Diagnosis not present

## 2017-08-18 DIAGNOSIS — R278 Other lack of coordination: Secondary | ICD-10-CM | POA: Diagnosis not present

## 2017-08-19 ENCOUNTER — Ambulatory Visit (HOSPITAL_COMMUNITY): Payer: Medicare Other

## 2017-08-19 NOTE — Telephone Encounter (Signed)
Please call and confirm pt doing ok and to keep [place] posted.

## 2017-08-19 NOTE — Telephone Encounter (Signed)
Patient cancelled appt today at [facility]. She is in the hospital.

## 2017-08-20 DIAGNOSIS — H8112 Benign paroxysmal vertigo, left ear: Secondary | ICD-10-CM | POA: Diagnosis not present

## 2017-08-20 DIAGNOSIS — J301 Allergic rhinitis due to pollen: Secondary | ICD-10-CM | POA: Diagnosis not present

## 2017-08-20 NOTE — Telephone Encounter (Signed)
Do you need to do a TCM on her?

## 2017-08-20 NOTE — Telephone Encounter (Signed)
Left message for patient to return call to office first attempt made for TCM Made.

## 2017-08-20 NOTE — Telephone Encounter (Signed)
Pt got out of hospital on Tuesday and has been scheduled for a follow up with you on 08/28/17. She says she is feeling much better and she has copies of all the scan results to bring with her.

## 2017-08-20 NOTE — Telephone Encounter (Signed)
Does this pt need TCM call.

## 2017-08-21 NOTE — Telephone Encounter (Signed)
Transition Care Management Follow-up Telephone Call  How have you been since you were released from the hospital? Patient stated she is feeling much better, but still off balance.Patient is brining copy of medical reports from hospital to appointment.   Do you understand why you were in the hospital? yes   Do you understand the discharge instrcutions? yes  Items Reviewed:  Medications reviewed: yes  Allergies reviewed: yes  Dietary changes reviewed: yes  Referrals reviewed: yes   Functional Questionnaire:   Activities of Daily Living (ADLs):   She states they are independent in the following: ambulation, bathing and hygiene, feeding, continence, grooming, toileting and dressing States they require assistance with the following: No assistance needed.   Any transportation issues/concerns?: no   Any patient concerns? no   Confirmed importance and date/time of follow-up visits scheduled: yes   Confirmed with patient if condition begins to worsen call PCP or go to the ER.  Patient was given the Call-a-Nurse line [phone]: yes

## 2017-08-27 ENCOUNTER — Inpatient Hospital Stay: Payer: Medicare Other | Admitting: Internal Medicine

## 2017-08-28 ENCOUNTER — Ambulatory Visit (INDEPENDENT_AMBULATORY_CARE_PROVIDER_SITE_OTHER): Payer: Medicare Other | Admitting: Internal Medicine

## 2017-08-28 ENCOUNTER — Encounter: Payer: Self-pay | Admitting: Internal Medicine

## 2017-08-28 DIAGNOSIS — F419 Anxiety disorder, unspecified: Secondary | ICD-10-CM | POA: Diagnosis not present

## 2017-08-28 DIAGNOSIS — R2681 Unsteadiness on feet: Secondary | ICD-10-CM | POA: Diagnosis not present

## 2017-08-28 DIAGNOSIS — E039 Hypothyroidism, unspecified: Secondary | ICD-10-CM | POA: Diagnosis not present

## 2017-08-28 NOTE — Progress Notes (Signed)
Patient ID: Leah Benitez, female   DOB: [birth year], 66 y.o.   MRN: [id number]   Subjective:    Patient ID: Leah Benitez, female    DOB: 1951/07/07, 66 y.o.   MRN: [id number]  HPI  Patient here for a scheduled follow up/hospital follow up. She reports she is feeling better.  She was having issues with her balance and dizziness.  Saw Dr Leah Benitez prior to her hospitalization.  Had Epley maneuvers.  Spinning stopped, but the unsteadiness continued.  Admitted.  Had carotid ultrasound that revealed <50% bilateral ICA stenosis.  CTA - mild ICA stenosis.  MRI c-spine - multilevel multifactorial spondylosis with mild thecal sac narrowing.  MRI (brain) revealed no acute abnormality.  She was placed on valium in the hospital and discharged on valium.  Took two tablets after discharge.  Symptoms have resolved.  No further dizziness.  No unsteadiness.  She feels back to her baseline. Feels much better.  No chest pain.  No sob.  No acid reflux.  No abdominal pain.  Bowels moving.      Past Medical History:  Diagnosis Date  . Allergy   . Hyperlipidemia   . Thyroid disease    Past Surgical History:  Procedure Laterality Date  . ABDOMINAL HYSTERECTOMY     previous abnormal pap smear  . BREAST BIOPSY    . TONSILLECTOMY     Family History  Problem Relation Age of Onset  . Arthritis Mother   . Stroke Mother   . Hypertension Mother   . Arthritis Father   . Heart disease Father   . Heart disease Brother   . Cancer Maternal Aunt        breast   Social History   Socioeconomic History  . Marital status: Married    Spouse name: None  . Number of children: None  . Years of education: None  . Highest education level: None  Social Needs  . Financial resource strain: None  . Food insecurity - worry: None  . Food insecurity - inability: None  . Transportation needs - medical: None  . Transportation needs - non-medical: None  Occupational History  . None  Tobacco Use  . Smoking status: Current Every Day  Smoker    Packs/day: 1.00    Years: 45.00    Pack years: 45.00    Types: Cigarettes  . Smokeless tobacco: Never Used  Substance and Sexual Activity  . Alcohol use: None  . Drug use: None  . Sexual activity: None  Other Topics Concern  . None  Social History Narrative  . None    Outpatient Encounter Medications as of 08/28/2017  Medication Sig  . atorvastatin (LIPITOR) 20 MG tablet Take 1 tablet (20 mg total) by mouth daily.  . cholecalciferol (VITAMIN D) 1000 units tablet Take 2,000 Units by mouth daily.  . clonazePAM (KLONOPIN) 0.5 MG tablet Take 0.5 mg by mouth 2 (two) times daily as needed for anxiety (1/2 tab qd).  . Esomeprazole Magnesium (NEXIUM PO) Take by mouth.  . fluticasone (FLONASE) 50 MCG/ACT nasal spray Place 2 sprays into both nostrils daily.  Marland Kitchen levothyroxine (SYNTHROID, LEVOTHROID) 88 MCG tablet TAKE 1 TABLET IN THE MORNING FOR THYROID  . diazepam (VALIUM) 2 MG tablet    No facility-administered encounter medications on file as of 08/28/2017.     Review of Systems  Constitutional: Negative for appetite change and unexpected weight change.  HENT: Negative for congestion and sinus pressure.   Respiratory: Negative for cough, chest tightness  and shortness of breath.   Cardiovascular: Negative for chest pain, palpitations and leg swelling.  Gastrointestinal: Negative for abdominal pain, diarrhea, nausea and vomiting.  Genitourinary: Negative for difficulty urinating and dysuria.  Musculoskeletal: Negative for back pain and joint swelling.  Skin: Negative for color change and rash.  Neurological: Negative for headaches.       No dizziness now.    Psychiatric/Behavioral: Negative for agitation and dysphoric mood.       Objective:    Physical Exam  Constitutional: She appears well-developed and well-nourished. No distress.  HENT:  Nose: Nose normal.  Mouth/Throat: Oropharynx is clear and moist.  Neck: Neck supple. No thyromegaly present.  Cardiovascular:  Normal rate and regular rhythm.  Pulmonary/Chest: Breath sounds normal. No respiratory distress. She has no wheezes.  Abdominal: Soft. Bowel sounds are normal. There is no tenderness.  Musculoskeletal: She exhibits no edema or tenderness.  Lymphadenopathy:    She has no cervical adenopathy.  Skin: No rash noted. No erythema.  Psychiatric: She has a normal mood and affect. Her behavior is normal.    BP 126/64   Pulse 88   Temp 98.6 F (37 C)   Wt 114 lb 9.6 oz (52 kg)   SpO2 96%   BMI 22.38 kg/m  Wt Readings from Last 3 Encounters:  08/30/17 114 lb 9.6 oz (52 kg)  08/10/17 115 lb (52.2 kg)  05/20/17 114 lb 6.4 oz (51.9 kg)     Lab Results  Component Value Date   WBC 10.8 (H) 09/12/2016   HGB 14.2 09/12/2016   HCT 41.5 09/12/2016   PLT 279.0 09/12/2016   GLUCOSE 92 05/18/2017   CHOL 169 05/18/2017   TRIG 92.0 05/18/2017   HDL 63.80 05/18/2017   LDLCALC 87 05/18/2017   ALT 22 05/18/2017   AST 19 05/18/2017   NA 141 05/18/2017   K 4.8 05/20/2017   CL 107 05/18/2017   CREATININE 0.78 05/18/2017   BUN 10 05/18/2017   CO2 29 05/18/2017   TSH 2.31 08/21/2016    Dg Bone Density  Result Date: 02/26/2017 EXAM: DUAL X-RAY ABSORPTIOMETRY (DXA) FOR BONE MINERAL DENSITY IMPRESSION: Dear Dr. Einar Benitez, Your patient Leah Benitez completed a BMD test on 02/26/2017 using the [place] (analysis version: 14.10) manufactured by [employer]. The following summarizes the results of our evaluation. PATIENT BIOGRAPHICAL: Name: Leah Benitez Patient ID: [id number] Birth Date: 11/01/1950 Height: 60.0 in. Gender: Female Exam Date: 02/26/2017 Weight: 111.0 lbs. Indications: Caucasian, Family Hx of Osteoporosis, Hypothyroid, Hysterectomy, Postmenopausal Fractures: Treatments: Calcium, Synthroid, Vitamin D ASSESSMENT: The BMD measured at Femur Neck Right is 0.697 g/cm2 with a T-score of -2.5. This patient is considered osteoporotic according to [facility])  criteria. Site Region Measured Measured WHO Young Adult BMD Date       Age      Classification T-score AP Spine L1-L4 02/26/2017 65.6 Osteopenia -2.2 0.927 g/cm2 DualFemur Neck Right 02/26/2017 65.6 Osteoporosis -2.5 0.697 g/cm2 World Health Organization [facility]) criteria for post-menopausal, Caucasian Women: Normal:       T-score at or above -1 SD Osteopenia:   T-score between -1 and -2.5 SD Osteoporosis: T-score at or below -2.5 SD RECOMMENDATIONS: [place] recommends that FDA-approved medical therapies be considered in postmenopausal women and men age 31 or older with a: 1. Hip or vertebral (clinical or morphometric) fracture. 2. T-score of < -2.5 at the spine or hip. 3. Ten-year fracture probability by FRAX of 3% or greater for hip  fracture or 20% or greater for major osteoporotic fracture. All treatment decisions require clinical judgment and consideration of individual patient factors, including patient preferences, co-morbidities, previous drug use, risk factors not captured in the FRAX model (e.g. falls, vitamin D deficiency, increased bone turnover, interval significant decline in bone density) and possible under - or over-estimation of fracture risk by FRAX. All patients should ensure an adequate intake of dietary calcium (1200 mg/d) and vitamin D (800 IU daily) unless contraindicated. FOLLOW-UP: People with diagnosed cases of osteoporosis or at high risk for fracture should have regular bone mineral density tests. For patients eligible for Medicare, routine testing is allowed once every 2 years. The testing frequency can be increased to one year for patients who have rapidly progressing disease, those who are receiving or discontinuing medical therapy to restore bone mass, or have additional risk factors. I have reviewed this report, and agree with the above findings. [facility] Electronically Signed   By: Nolon Nations M.D.   On: 02/26/2017 10:37       Assessment &  Plan:   Problem List Items Addressed This Visit    Anxiety    On clonazepam.  Stable.  Doing better.       Relevant Medications   diazepam (VALIUM) 2 MG tablet   Hypothyroidism    On thyroid replacement.  Follow tsh.       Unsteadiness    Had recent issues with a concussion and vertigo.  See previous notes.  Concussion symptoms resolved.  Had problems with dizziness and unsteadiness.  Saw Dr Leah Benitez.  Had epley maneuvers.  Spinning resolved.  Unsteadiness persistent.  Admitted.  W/up as outlined.  MRI unrevealing.  CTA mild ICA stenosis.  C-spine as outlined.  Treated with valium.  Symptoms resolved.  Currently asymptomatic.  Still unclear as to the exact etiology.  Continue daily aspirin.  Will have neurology evaluate.  Already has appt scheduled.  Scheduled prior to her discharge from the hospital.  Pt comfortable with this plan.            Leah Pheasant, MD

## 2017-08-30 ENCOUNTER — Encounter: Payer: Self-pay | Admitting: Internal Medicine

## 2017-08-30 MED ORDER — LEVOTHYROXINE SODIUM 88 MCG PO TABS: ORAL_TABLET | ORAL | 6 refills | Status: DC

## 2017-08-30 MED ORDER — ATORVASTATIN CALCIUM 20 MG PO TABS: 20.0000 mg | ORAL_TABLET | Freq: Every day | ORAL | 6 refills | Status: DC

## 2017-08-30 NOTE — Assessment & Plan Note (Signed)
On thyroid replacement.  Follow tsh.  

## 2017-08-30 NOTE — Assessment & Plan Note (Signed)
Had recent issues with a concussion and vertigo.  See previous notes.  Concussion symptoms resolved.  Had problems with dizziness and unsteadiness.  Saw Dr Richardson Landry.  Had epley maneuvers.  Spinning resolved.  Unsteadiness persistent.  Admitted.  W/up as outlined.  MRI unrevealing.  CTA mild ICA stenosis.  C-spine as outlined.  Treated with valium.  Symptoms resolved.  Currently asymptomatic.  Still unclear as to the exact etiology.  Continue daily aspirin.  Will have neurology evaluate.  Already has appt scheduled.  Scheduled prior to her discharge from the hospital.  Pt comfortable with this plan.

## 2017-08-30 NOTE — Assessment & Plan Note (Signed)
On clonazepam.  Stable.  Doing better.

## 2017-09-07 DIAGNOSIS — R42 Dizziness and giddiness: Secondary | ICD-10-CM | POA: Diagnosis not present

## 2017-09-07 DIAGNOSIS — H8112 Benign paroxysmal vertigo, left ear: Secondary | ICD-10-CM | POA: Diagnosis not present

## 2017-09-07 DIAGNOSIS — F0781 Postconcussional syndrome: Secondary | ICD-10-CM | POA: Diagnosis not present

## 2017-09-07 DIAGNOSIS — F5104 Psychophysiologic insomnia: Secondary | ICD-10-CM | POA: Diagnosis not present

## 2017-09-29 ENCOUNTER — Ambulatory Visit: Payer: Medicare Other | Admitting: Internal Medicine

## 2017-10-26 ENCOUNTER — Telehealth: Payer: Self-pay

## 2017-10-26 NOTE — Telephone Encounter (Signed)
-----   Message from Einar Pheasant, MD sent at 10/26/2017  5:04 AM EST ----- Regarding: needs mammogram Received notice.  Pt overdue mammogram.  Please contact her and see if she is agreeable to be scheduled.  If so, let me know and I will order.  Thanks    Dr Nicki Reaper

## 2017-10-26 NOTE — Telephone Encounter (Signed)
Left message for patient to call back regarding mammogram.

## 2017-11-03 NOTE — Telephone Encounter (Signed)
Pt states she will schedule her apt at [facility] because it is closer and mover convenient for her.

## 2017-11-03 NOTE — Telephone Encounter (Signed)
fyi

## 2017-11-04 ENCOUNTER — Telehealth: Payer: Self-pay | Admitting: *Deleted

## 2017-11-04 ENCOUNTER — Other Ambulatory Visit: Payer: Self-pay | Admitting: *Deleted

## 2017-11-04 DIAGNOSIS — Z122 Encounter for screening for malignant neoplasm of respiratory organs: Secondary | ICD-10-CM

## 2017-11-04 DIAGNOSIS — Z87891 Personal history of nicotine dependence: Secondary | ICD-10-CM

## 2017-11-04 NOTE — Telephone Encounter (Signed)
Notified patient that annual lung cancer screening low dose CT scan is due currently or will be in near future. Confirmed that patient is within the age range of 55-77, and asymptomatic, (no signs or symptoms of lung cancer). Patient denies illness that would prevent curative treatment for lung cancer if found. Verified smoking history, (current, 46 pack year). The shared decision making visit was done 11/11/16. Patient is agreeable for CT scan being scheduled.

## 2017-11-12 ENCOUNTER — Encounter: Payer: Self-pay | Admitting: Internal Medicine

## 2017-11-12 DIAGNOSIS — Z1231 Encounter for screening mammogram for malignant neoplasm of breast: Secondary | ICD-10-CM | POA: Diagnosis not present

## 2017-11-13 ENCOUNTER — Ambulatory Visit
Admission: RE | Admit: 2017-11-13 | Discharge: 2017-11-13 | Disposition: A | Discharge status: Home / Self Care | Payer: Medicare Other | Source: Ambulatory Visit | Attending: Nurse Practitioner | Admitting: Nurse Practitioner

## 2017-11-13 DIAGNOSIS — I2510 Atherosclerotic heart disease of native coronary artery without angina pectoris: Secondary | ICD-10-CM | POA: Diagnosis not present

## 2017-11-13 DIAGNOSIS — I700 Atherosclerosis of aorta: Secondary | ICD-10-CM | POA: Diagnosis not present

## 2017-11-13 DIAGNOSIS — Z122 Encounter for screening for malignant neoplasm of respiratory organs: Secondary | ICD-10-CM

## 2017-11-13 DIAGNOSIS — J439 Emphysema, unspecified: Secondary | ICD-10-CM | POA: Diagnosis not present

## 2017-11-13 DIAGNOSIS — F17210 Nicotine dependence, cigarettes, uncomplicated: Secondary | ICD-10-CM | POA: Diagnosis not present

## 2017-11-13 DIAGNOSIS — Z87891 Personal history of nicotine dependence: Secondary | ICD-10-CM | POA: Diagnosis not present

## 2017-11-16 ENCOUNTER — Encounter: Payer: Self-pay | Admitting: Internal Medicine

## 2017-11-16 ENCOUNTER — Encounter: Payer: Self-pay | Admitting: *Deleted

## 2017-11-16 DIAGNOSIS — R9389 Abnormal findings on diagnostic imaging of other specified body structures: Secondary | ICD-10-CM | POA: Insufficient documentation

## 2017-11-28 ENCOUNTER — Encounter: Payer: Self-pay | Admitting: Internal Medicine

## 2017-11-30 ENCOUNTER — Encounter: Payer: Self-pay | Admitting: Internal Medicine

## 2017-11-30 ENCOUNTER — Telehealth: Payer: Self-pay | Admitting: Internal Medicine

## 2017-11-30 NOTE — Telephone Encounter (Signed)
Duplicate message. See phone message on this pt.   Thanks.

## 2017-11-30 NOTE — Telephone Encounter (Signed)
Letter dictated.  Printed, signed and placed in box.  Please fax.

## 2017-11-30 NOTE — Telephone Encounter (Signed)
Copied from [place] [id number]. Topic: Inquiry >> Nov 30, 2017  8:32 AM Leah Benitez, [place] wrote: Reason for CRM: Patient calling because she needs a medical clearance for exertion therapy to be faxed over to there office at [phone]. Stated that she will be leaving for [place] tomorrow for this appointment. She would as like for the paper work to be faxed to her as well at [phone]. She can also be reached at [phone]

## 2017-11-30 NOTE — Telephone Encounter (Signed)
Patient stated it does not take much to cause her to be off balance and does not feel this will be exertional, could please give clearance, advised patient she should be seen she says she cannot that she leaves in the morning at 7:30 am for this appointment,

## 2017-11-30 NOTE — Telephone Encounter (Signed)
Left message for patient to return call to office. Need more information as to what patient will be doing at this therapy she is going to take. Need more information. Advised patient may need appointment. [facility] can inquire and advise.

## 2017-11-30 NOTE — Telephone Encounter (Signed)
Please call pt.  I am a little confused as to exactly what she will be doing.  Is there anyway she can come in for an appt tomorrow at 9:30.  I did not know what time she is leaving to go out of town.

## 2017-11-30 NOTE — Telephone Encounter (Signed)
This therapy from patient Email includes treadmill, stationary bike riding and other strenuous activities would this require a visit for clearance.

## 2017-12-01 NOTE — Telephone Encounter (Signed)
Letter faxed. Patient is aware and received her copy of the letter.

## 2017-12-02 DIAGNOSIS — S060X0A Concussion without loss of consciousness, initial encounter: Secondary | ICD-10-CM | POA: Diagnosis not present

## 2017-12-02 DIAGNOSIS — S060X0D Concussion without loss of consciousness, subsequent encounter: Secondary | ICD-10-CM | POA: Diagnosis not present

## 2017-12-22 ENCOUNTER — Ambulatory Visit (INDEPENDENT_AMBULATORY_CARE_PROVIDER_SITE_OTHER): Payer: Medicare Other | Admitting: Internal Medicine

## 2017-12-22 ENCOUNTER — Encounter: Payer: Self-pay | Admitting: Internal Medicine

## 2017-12-22 DIAGNOSIS — E7800 Pure hypercholesterolemia, unspecified: Secondary | ICD-10-CM

## 2017-12-22 DIAGNOSIS — S060X0D Concussion without loss of consciousness, subsequent encounter: Secondary | ICD-10-CM

## 2017-12-22 DIAGNOSIS — E039 Hypothyroidism, unspecified: Secondary | ICD-10-CM | POA: Diagnosis not present

## 2017-12-22 DIAGNOSIS — G479 Sleep disorder, unspecified: Secondary | ICD-10-CM

## 2017-12-22 NOTE — Progress Notes (Addendum)
Patient ID: Leah Benitez, female   DOB: 1951/04/17, 67 y.o.   MRN: [id number]   Subjective:    Patient ID: Leah Benitez, female    DOB: 05-Apr-1951, 67 y.o.   MRN: [id number]  HPI  Patient here for a scheduled follow up.  She reports she is doing better.  Went out of state for her dizziness and therapy.  Dizziness better.  No headache.  Tries to stay active.  No chest pain.  No sob. No acid reflux.  No abdominal pain.  Bowels moving.  Having some trouble sleeping.  Discussed melatonin.     Past Medical History:  Diagnosis Date  . Allergy   . Hyperlipidemia   . Thyroid disease    Past Surgical History:  Procedure Laterality Date  . ABDOMINAL HYSTERECTOMY     previous abnormal pap smear  . BREAST BIOPSY    . TONSILLECTOMY     Family History  Problem Relation Age of Onset  . Arthritis Mother   . Stroke Mother   . Hypertension Mother   . Arthritis Father   . Heart disease Father   . Heart disease Brother   . Cancer Maternal Aunt        breast   Social History   Socioeconomic History  . Marital status: Married    Spouse name: None  . Number of children: None  . Years of education: None  . Highest education level: None  Social Needs  . Financial resource strain: None  . Food insecurity - worry: None  . Food insecurity - inability: None  . Transportation needs - medical: None  . Transportation needs - non-medical: None  Occupational History  . None  Tobacco Use  . Smoking status: Current Every Day Smoker    Packs/day: 1.00    Years: 45.00    Pack years: 45.00    Types: Cigarettes  . Smokeless tobacco: Never Used  Substance and Sexual Activity  . Alcohol use: None  . Drug use: None  . Sexual activity: None  Other Topics Concern  . None  Social History Narrative  . None    Outpatient Encounter Medications as of 12/22/2017  Medication Sig  . atorvastatin (LIPITOR) 20 MG tablet Take 1 tablet (20 mg total) daily by mouth.  . cholecalciferol (VITAMIN D) 1000 units  tablet Take 2,000 Units by mouth daily.  . Esomeprazole Magnesium (NEXIUM PO) Take by mouth.  . fluticasone (FLONASE) 50 MCG/ACT nasal spray Place 2 sprays into both nostrils daily.  Marland Kitchen levothyroxine (SYNTHROID, LEVOTHROID) 88 MCG tablet TAKE 1 TABLET IN THE MORNING FOR THYROID  . [DISCONTINUED] clonazePAM (KLONOPIN) 0.5 MG tablet Take 0.5 mg by mouth 2 (two) times daily as needed for anxiety (1/2 tab qd).  . [DISCONTINUED] diazepam (VALIUM) 2 MG tablet    No facility-administered encounter medications on file as of 12/22/2017.     Review of Systems  Constitutional: Negative for appetite change and unexpected weight change.  HENT: Negative for congestion and sinus pressure.   Respiratory: Negative for cough, chest tightness and shortness of breath.   Cardiovascular: Negative for chest pain, palpitations and leg swelling.  Gastrointestinal: Negative for abdominal pain, diarrhea, nausea and vomiting.  Genitourinary: Negative for difficulty urinating and dysuria.  Musculoskeletal: Negative for joint swelling and myalgias.  Skin: Negative for color change and rash.  Neurological: Negative for headaches.       Dizziness improved.    Psychiatric/Behavioral: Negative for agitation and dysphoric mood.  Objective:    Physical Exam  Constitutional: She appears well-developed and well-nourished. No distress.  HENT:  Nose: Nose normal.  Mouth/Throat: Oropharynx is clear and moist.  Neck: Neck supple. No thyromegaly present.  Cardiovascular: Normal rate and regular rhythm.  1/6 systolic murmur  Pulmonary/Chest: Breath sounds normal. No respiratory distress. She has no wheezes.  Abdominal: Soft. Bowel sounds are normal. There is no tenderness.  Musculoskeletal: She exhibits no edema or tenderness.  Lymphadenopathy:    She has no cervical adenopathy.  Skin: No rash noted. No erythema.  Psychiatric: She has a normal mood and affect. Her behavior is normal.    BP 128/62 (BP Location:  Left Arm, Patient Position: Sitting, Cuff Size: Normal)   Pulse 83   Temp 98.2 F (36.8 C) (Oral)   Resp 18   Wt 114 lb 6.4 oz (51.9 kg)   SpO2 98%   BMI 22.34 kg/m  Wt Readings from Last 3 Encounters:  12/22/17 114 lb 6.4 oz (51.9 kg)  11/13/17 114 lb (51.7 kg)  08/30/17 114 lb 9.6 oz (52 kg)     Lab Results  Component Value Date   WBC 10.8 (H) 09/12/2016   HGB 14.2 09/12/2016   HCT 41.5 09/12/2016   PLT 279.0 09/12/2016   GLUCOSE 92 05/18/2017   CHOL 169 05/18/2017   TRIG 92.0 05/18/2017   HDL 63.80 05/18/2017   LDLCALC 87 05/18/2017   ALT 22 05/18/2017   AST 19 05/18/2017   NA 141 05/18/2017   K 4.8 05/20/2017   CL 107 05/18/2017   CREATININE 0.78 05/18/2017   BUN 10 05/18/2017   CO2 29 05/18/2017   TSH 2.31 08/21/2016    Ct Chest Lung Cancer Screening Low Dose Wo Contrast  Result Date: 11/16/2017 CLINICAL DATA:  Current smoker, 46 pack-year history, lung cancer screening. EXAM: CT CHEST WITHOUT CONTRAST LOW-DOSE FOR LUNG CANCER SCREENING TECHNIQUE: Multidetector CT imaging of the chest was performed following the standard protocol without IV contrast. COMPARISON:  11/11/2016. FINDINGS: Cardiovascular: Atherosclerotic calcification of the arterial vasculature, including coronary arteries. Heart size normal. No pericardial effusion. Mediastinum/Nodes: No pathologically enlarged mediastinal or axillary lymph nodes. Hilar regions are difficult to definitively evaluate without IV contrast but appear grossly unremarkable. Esophagus is grossly unremarkable. Lungs/Pleura: Biapical pleuroparenchymal scarring. Mild to moderate centrilobular emphysema. Peripheral pulmonary nodules measure 2 mm or less in size. No new pulmonary nodules. No pleural fluid. Airway is unremarkable. Upper Abdomen: Subcentimeter low-attenuation lesion in the left hepatic lobe is too small to characterize. Adrenal glands are unremarkable. Low and high attenuating lesions in the kidneys measure up to 9 mm on  the right, too small to characterize. Visualized portions of the spleen, pancreas, stomach and bowel are grossly unremarkable. No upper abdominal adenopathy. Musculoskeletal: No worrisome lytic or sclerotic lesions. IMPRESSION: 1. Lung-RADS 2, benign appearance or behavior. Continue annual screening with low-dose chest CT without contrast in 12 months. 2. Aortic atherosclerosis (ICD10-170.0). Coronary artery calcification. 3.  Emphysema (ICD10-J43.9). Electronically Signed   By: Lorin Picket M.D.   On: 11/16/2017 08:18       Assessment & Plan:   Problem List Items Addressed This Visit    Concussion    Had the fall as outlined.  Was followed by neurology.  Also saw ENT.  Went to therapy as outlined.  Doing better.  Follow.        Difficulty sleeping    Discussed with her today.  Discussed melatonin.  Trial of melatonin.  Follow.  Hypercholesterolemia    On lipitor.  Low cholesterol diet and exercise.  Follow lipid panel and liver function tests.        Hypothyroidism    On thyroid replacement.  Follow tsh.            Einar Pheasant, MD

## 2017-12-26 ENCOUNTER — Encounter: Payer: Self-pay | Admitting: Internal Medicine

## 2017-12-26 DIAGNOSIS — G479 Sleep disorder, unspecified: Secondary | ICD-10-CM | POA: Insufficient documentation

## 2017-12-26 NOTE — Assessment & Plan Note (Signed)
Discussed with her today.  Discussed melatonin.  Trial of melatonin.  Follow.

## 2017-12-26 NOTE — Assessment & Plan Note (Signed)
On lipitor.  Low cholesterol diet and exercise.  Follow lipid panel and liver function tests.   

## 2017-12-26 NOTE — Assessment & Plan Note (Signed)
Had the fall as outlined.  Was followed by neurology.  Also saw ENT.  Went to therapy as outlined.  Doing better.  Follow.

## 2017-12-26 NOTE — Assessment & Plan Note (Signed)
On thyroid replacement.  Follow tsh.  

## 2018-01-13 ENCOUNTER — Other Ambulatory Visit: Payer: Medicare Other

## 2018-02-11 ENCOUNTER — Other Ambulatory Visit (INDEPENDENT_AMBULATORY_CARE_PROVIDER_SITE_OTHER): Payer: Medicare Other

## 2018-02-11 DIAGNOSIS — R42 Dizziness and giddiness: Secondary | ICD-10-CM

## 2018-02-11 DIAGNOSIS — E7800 Pure hypercholesterolemia, unspecified: Secondary | ICD-10-CM | POA: Diagnosis not present

## 2018-02-11 DIAGNOSIS — E039 Hypothyroidism, unspecified: Secondary | ICD-10-CM

## 2018-02-11 LAB — CBC WITH DIFFERENTIAL/PLATELET
BASOS ABS: 0.1 10*3/uL (ref 0.0–0.1)
BASOS PCT: 0.7 % (ref 0.0–3.0)
EOS ABS: 0.2 10*3/uL (ref 0.0–0.7)
Eosinophils Relative: 2.3 % (ref 0.0–5.0)
HEMATOCRIT: 38.7 % (ref 36.0–46.0)
HEMOGLOBIN: 13.1 g/dL (ref 12.0–15.0)
LYMPHS PCT: 27.3 % (ref 12.0–46.0)
Lymphs Abs: 2.6 10*3/uL (ref 0.7–4.0)
MCHC: 33.8 g/dL (ref 30.0–36.0)
MCV: 97.1 fl (ref 78.0–100.0)
MONOS PCT: 6.1 % (ref 3.0–12.0)
Monocytes Absolute: 0.6 10*3/uL (ref 0.1–1.0)
Neutro Abs: 6.1 10*3/uL (ref 1.4–7.7)
Neutrophils Relative %: 63.6 % (ref 43.0–77.0)
Platelets: 300 10*3/uL (ref 150.0–400.0)
RBC: 3.99 Mil/uL (ref 3.87–5.11)
RDW: 13.5 % (ref 11.5–15.5)
WBC: 9.6 10*3/uL (ref 4.0–10.5)

## 2018-02-11 LAB — BASIC METABOLIC PANEL
BUN: 9 mg/dL (ref 6–23)
CALCIUM: 9.3 mg/dL (ref 8.4–10.5)
CHLORIDE: 105 meq/L (ref 96–112)
CO2: 28 meq/L (ref 19–32)
CREATININE: 0.7 mg/dL (ref 0.40–1.20)
GFR: 88.8 mL/min (ref >60.00)
GLUCOSE: 94 mg/dL (ref 70–99)
Potassium: 5.1 mEq/L (ref 3.5–5.1)
Sodium: 141 mEq/L (ref 135–145)

## 2018-02-11 LAB — LIPID PANEL
CHOL/HDL RATIO: 3
Cholesterol: 162 mg/dL (ref 0–200)
HDL: 58.1 mg/dL (ref >39.00)
LDL CALC: 87 mg/dL (ref 0–99)
NONHDL: 103.71
TRIGLYCERIDES: 82 mg/dL (ref 0.0–149.0)
VLDL: 16.4 mg/dL (ref 0.0–40.0)

## 2018-02-11 LAB — HEPATIC FUNCTION PANEL
ALT: 21 U/L (ref 0–35)
AST: 23 U/L (ref 0–37)
Albumin: 4 g/dL (ref 3.5–5.2)
Alkaline Phosphatase: 61 U/L (ref 39–117)
BILIRUBIN TOTAL: 0.5 mg/dL (ref 0.2–1.2)
Bilirubin, Direct: 0.1 mg/dL (ref 0.0–0.3)
Total Protein: 6.4 g/dL (ref 6.0–8.3)

## 2018-02-11 LAB — TSH: TSH: 2.59 u[IU]/mL (ref 0.35–4.50)

## 2018-02-12 ENCOUNTER — Encounter: Payer: Self-pay | Admitting: Internal Medicine

## 2018-03-04 ENCOUNTER — Other Ambulatory Visit: Payer: Self-pay | Admitting: Internal Medicine

## 2018-03-26 ENCOUNTER — Encounter: Payer: Self-pay | Admitting: Internal Medicine

## 2018-03-26 ENCOUNTER — Ambulatory Visit (INDEPENDENT_AMBULATORY_CARE_PROVIDER_SITE_OTHER): Payer: Medicare Other | Admitting: Internal Medicine

## 2018-03-26 VITALS — BP 128/72 | HR 71 | Temp 98.4°F | Resp 18 | Wt 109.0 lb

## 2018-03-26 DIAGNOSIS — E875 Hyperkalemia: Secondary | ICD-10-CM | POA: Diagnosis not present

## 2018-03-26 DIAGNOSIS — I700 Atherosclerosis of aorta: Secondary | ICD-10-CM

## 2018-03-26 DIAGNOSIS — E039 Hypothyroidism, unspecified: Secondary | ICD-10-CM | POA: Diagnosis not present

## 2018-03-26 DIAGNOSIS — E7800 Pure hypercholesterolemia, unspecified: Secondary | ICD-10-CM | POA: Diagnosis not present

## 2018-03-26 DIAGNOSIS — F439 Reaction to severe stress, unspecified: Secondary | ICD-10-CM | POA: Diagnosis not present

## 2018-03-26 NOTE — Progress Notes (Signed)
Patient ID: Leah Benitez, female   DOB: 1950-11-20, 66 y.o.   MRN: [id number]   Subjective:    Patient ID: Leah Benitez, female    DOB: 1951-06-15, 67 y.o.   MRN: [id number]  HPI  Patient here for a scheduled follow up.  Has been under increased stress recently with her son/grandson's recent accident.  Her son is recovering slowly.  Leah Benitez is doing ok.  She feels things are starting to level out some. She has lost weight.  States coming up now.  Eating better.  Feels was related to stress.  Stays active.  No chest pain.  No sob.  No acid reflux.  No abdominal pain.  Previously had diarrhea.  Resolved now.  She relates to stress.  Dizziness is better.  Discussed continuing her exercises.  Intermittent problems with her "crystals", but overall she is doing much better.     Past Medical History:  Diagnosis Date  . Allergy   . Hyperlipidemia   . Thyroid disease    Past Surgical History:  Procedure Laterality Date  . ABDOMINAL HYSTERECTOMY     previous abnormal pap smear  . BREAST BIOPSY    . TONSILLECTOMY     Family History  Problem Relation Age of Onset  . Arthritis Mother   . Stroke Mother   . Hypertension Mother   . Arthritis Father   . Heart disease Father   . Heart disease Brother   . Cancer Maternal Aunt        breast   Social History   Socioeconomic History  . Marital status: Married    Spouse name: Not on file  . Number of children: Not on file  . Years of education: Not on file  . Highest education level: Not on file  Occupational History  . Not on file  Social Needs  . Financial resource strain: Not on file  . Food insecurity:    Worry: Not on file    Inability: Not on file  . Transportation needs:    Medical: Not on file    Non-medical: Not on file  Tobacco Use  . Smoking status: Current Every Day Smoker    Packs/day: 1.00    Years: 45.00    Pack years: 45.00    Types: Cigarettes  . Smokeless tobacco: Never Used  Substance and Sexual Activity  . Alcohol  use: Not on file  . Drug use: Not on file  . Sexual activity: Not on file  Lifestyle  . Physical activity:    Days per week: Not on file    Minutes per session: Not on file  . Stress: Not on file  Relationships  . Social connections:    Talks on phone: Not on file    Gets together: Not on file    Attends religious service: Not on file    Active member of club or organization: Not on file    Attends meetings of clubs or organizations: Not on file    Relationship status: Not on file  Other Topics Concern  . Not on file  Social History Narrative  . Not on file    Outpatient Encounter Medications as of 03/26/2018  Medication Sig  . atorvastatin (LIPITOR) 20 MG tablet TAKE 1 TABLET BY MOUTH AT BEDTIME FOR CHOLESTEROL  . cholecalciferol (VITAMIN D) 1000 units tablet Take 2,000 Units by mouth daily.  . Esomeprazole Magnesium (NEXIUM PO) Take by mouth.  . fluticasone (FLONASE) 50 MCG/ACT nasal spray Place 2 sprays into  both nostrils daily.  Marland Kitchen levothyroxine (SYNTHROID, LEVOTHROID) 88 MCG tablet TAKE 1 TABLET IN THE MORNING FOR THYROID   No facility-administered encounter medications on file as of 03/26/2018.     Review of Systems  Constitutional: Negative for appetite change.       Weight loss.  She states is improving now.    HENT: Negative for congestion and sinus pressure.   Respiratory: Negative for cough, chest tightness and shortness of breath.   Cardiovascular: Negative for chest pain, palpitations and leg swelling.  Gastrointestinal: Negative for abdominal pain, diarrhea, nausea and vomiting.  Genitourinary: Negative for difficulty urinating and dysuria.  Musculoskeletal: Negative for joint swelling and myalgias.  Skin: Negative for color change and rash.  Neurological: Negative for dizziness, light-headedness and headaches.  Psychiatric/Behavioral: Negative for agitation and dysphoric mood.       Objective:    Physical Exam  Constitutional: She appears well-developed  and well-nourished. No distress.  HENT:  Nose: Nose normal.  Mouth/Throat: Oropharynx is clear and moist.  Neck: Neck supple. No thyromegaly present.  Cardiovascular: Normal rate and regular rhythm.  Pulmonary/Chest: Breath sounds normal. No respiratory distress. She has no wheezes.  Abdominal: Soft. Bowel sounds are normal. There is no tenderness.  Musculoskeletal: She exhibits no edema or tenderness.  Lymphadenopathy:    She has no cervical adenopathy.  Skin: No rash noted. No erythema.  Psychiatric: She has a normal mood and affect. Her behavior is normal.    BP 128/72 (BP Location: Left Arm, Patient Position: Sitting, Cuff Size: Normal)   Pulse 71   Temp 98.4 F (36.9 C) (Oral)   Resp 18   Wt 109 lb (49.4 kg)   SpO2 98%   BMI 21.29 kg/m  Wt Readings from Last 3 Encounters:  03/26/18 109 lb (49.4 kg)  12/22/17 114 lb 6.4 oz (51.9 kg)  11/13/17 114 lb (51.7 kg)     Lab Results  Component Value Date   WBC 9.6 02/11/2018   HGB 13.1 02/11/2018   HCT 38.7 02/11/2018   PLT 300.0 02/11/2018   GLUCOSE 94 02/11/2018   CHOL 162 02/11/2018   TRIG 82.0 02/11/2018   HDL 58.10 02/11/2018   LDLCALC 87 02/11/2018   ALT 21 02/11/2018   AST 23 02/11/2018   NA 141 02/11/2018   K 5.1 02/11/2018   CL 105 02/11/2018   CREATININE 0.70 02/11/2018   BUN 9 02/11/2018   CO2 28 02/11/2018   TSH 2.59 02/11/2018    Ct Chest Lung Cancer Screening Low Dose Wo Contrast  Result Date: 11/16/2017 CLINICAL DATA:  Current smoker, 46 pack-year history, lung cancer screening. EXAM: CT CHEST WITHOUT CONTRAST LOW-DOSE FOR LUNG CANCER SCREENING TECHNIQUE: Multidetector CT imaging of the chest was performed following the standard protocol without IV contrast. COMPARISON:  11/11/2016. FINDINGS: Cardiovascular: Atherosclerotic calcification of the arterial vasculature, including coronary arteries. Heart size normal. No pericardial effusion. Mediastinum/Nodes: No pathologically enlarged mediastinal or  axillary lymph nodes. Hilar regions are difficult to definitively evaluate without IV contrast but appear grossly unremarkable. Esophagus is grossly unremarkable. Lungs/Pleura: Biapical pleuroparenchymal scarring. Mild to moderate centrilobular emphysema. Peripheral pulmonary nodules measure 2 mm or less in size. No new pulmonary nodules. No pleural fluid. Airway is unremarkable. Upper Abdomen: Subcentimeter low-attenuation lesion in the left hepatic lobe is too small to characterize. Adrenal glands are unremarkable. Low and high attenuating lesions in the kidneys measure up to 9 mm on the right, too small to characterize. Visualized portions of the spleen, pancreas, stomach  and bowel are grossly unremarkable. No upper abdominal adenopathy. Musculoskeletal: No worrisome lytic or sclerotic lesions. IMPRESSION: 1. Lung-RADS 2, benign appearance or behavior. Continue annual screening with low-dose chest CT without contrast in 12 months. 2. Aortic atherosclerosis (ICD10-170.0). Coronary artery calcification. 3.  Emphysema (ICD10-J43.9). Electronically Signed   By: Lorin Picket M.D.   On: 11/16/2017 08:18       Assessment & Plan:   Problem List Items Addressed This Visit    Aortic atherosclerosis ([place])    On lipitor.        Hypercholesterolemia    On lipitor.  Low cholesterol diet and exercise.  Follow lipid panel and liver function tests.        Hypothyroidism    On thyroid replacement.  Follow tsh.       Stress    Increased stress as outlined.  Is better.  Weight is down.  She reports appetite has improved and weight is actually coming up.  Follow.  Does not feel she needs anything more at this time.         Other Visit Diagnoses    Hyperkalemia    -  Primary   Relevant Orders   Potassium       Einar Pheasant, MD

## 2018-03-29 ENCOUNTER — Encounter: Payer: Self-pay | Admitting: Internal Medicine

## 2018-03-29 DIAGNOSIS — F439 Reaction to severe stress, unspecified: Secondary | ICD-10-CM | POA: Insufficient documentation

## 2018-03-29 DIAGNOSIS — I700 Atherosclerosis of aorta: Secondary | ICD-10-CM | POA: Insufficient documentation

## 2018-03-29 NOTE — Assessment & Plan Note (Signed)
On lipitor

## 2018-03-29 NOTE — Assessment & Plan Note (Addendum)
Increased stress as outlined.  Is better.  Weight is down.  She reports appetite has improved and weight is actually coming up.  Follow.  Does not feel she needs anything more at this time.

## 2018-03-29 NOTE — Assessment & Plan Note (Signed)
On lipitor.  Low cholesterol diet and exercise.  Follow lipid panel and liver function tests.   

## 2018-03-29 NOTE — Assessment & Plan Note (Signed)
On thyroid replacement.  Follow tsh.  

## 2018-04-05 ENCOUNTER — Other Ambulatory Visit (INDEPENDENT_AMBULATORY_CARE_PROVIDER_SITE_OTHER): Payer: Medicare Other

## 2018-04-05 DIAGNOSIS — E875 Hyperkalemia: Secondary | ICD-10-CM | POA: Diagnosis not present

## 2018-04-05 LAB — POTASSIUM: Potassium: 4.9 mEq/L (ref 3.5–5.1)

## 2018-04-06 ENCOUNTER — Encounter: Payer: Self-pay | Admitting: Internal Medicine

## 2018-04-09 ENCOUNTER — Other Ambulatory Visit: Payer: Medicare Other

## 2018-04-28 ENCOUNTER — Other Ambulatory Visit: Payer: Self-pay | Admitting: Internal Medicine

## 2018-07-30 ENCOUNTER — Ambulatory Visit (INDEPENDENT_AMBULATORY_CARE_PROVIDER_SITE_OTHER): Payer: Medicare Other | Admitting: Internal Medicine

## 2018-07-30 DIAGNOSIS — F439 Reaction to severe stress, unspecified: Secondary | ICD-10-CM

## 2018-07-30 DIAGNOSIS — I700 Atherosclerosis of aorta: Secondary | ICD-10-CM

## 2018-07-30 DIAGNOSIS — F419 Anxiety disorder, unspecified: Secondary | ICD-10-CM | POA: Diagnosis not present

## 2018-07-30 DIAGNOSIS — E039 Hypothyroidism, unspecified: Secondary | ICD-10-CM | POA: Diagnosis not present

## 2018-07-30 DIAGNOSIS — E7800 Pure hypercholesterolemia, unspecified: Secondary | ICD-10-CM

## 2018-07-30 NOTE — Progress Notes (Signed)
Patient ID: Leah Benitez, female   DOB: [birth year], 67 y.o.   MRN: [id number]   Subjective:    Patient ID: Leah Benitez, female    DOB: Nov 06, 1950, 68 y.o.   MRN: [id number]  HPI  Patient here for a scheduled follow up.  She reports she is doing well.  Dizziness is better.  Not a significant issue for her now.  No headache.  No chest pain. No sob.  No acid reflux. No abdominal pain.  Bowels moving.  Overall feels good.  Handling stress. Her son and grandson are doing better.     Past Medical History:  Diagnosis Date  . Allergy   . Hyperlipidemia   . Thyroid disease    Past Surgical History:  Procedure Laterality Date  . ABDOMINAL HYSTERECTOMY     previous abnormal pap smear  . BREAST BIOPSY    . TONSILLECTOMY     Family History  Problem Relation Age of Onset  . Arthritis Mother   . Stroke Mother   . Hypertension Mother   . Arthritis Father   . Heart disease Father   . Heart disease Brother   . Cancer Maternal Aunt        breast   Social History   Socioeconomic History  . Marital status: Married    Spouse name: Not on file  . Number of children: Not on file  . Years of education: Not on file  . Highest education level: Not on file  Occupational History  . Not on file  Social Needs  . Financial resource strain: Not on file  . Food insecurity:    Worry: Not on file    Inability: Not on file  . Transportation needs:    Medical: Not on file    Non-medical: Not on file  Tobacco Use  . Smoking status: Current Every Day Smoker    Packs/day: 1.00    Years: 45.00    Pack years: 45.00    Types: Cigarettes  . Smokeless tobacco: Never Used  Substance and Sexual Activity  . Alcohol use: Not on file  . Drug use: Not on file  . Sexual activity: Not on file  Lifestyle  . Physical activity:    Days per week: Not on file    Minutes per session: Not on file  . Stress: Not on file  Relationships  . Social connections:    Talks on phone: Not on file    Gets together: Not on  file    Attends religious service: Not on file    Active member of club or organization: Not on file    Attends meetings of clubs or organizations: Not on file    Relationship status: Not on file  Other Topics Concern  . Not on file  Social History Narrative  . Not on file    Outpatient Encounter Medications as of 07/30/2018  Medication Sig  . atorvastatin (LIPITOR) 20 MG tablet TAKE 1 TABLET BY MOUTH AT BEDTIME FOR CHOLESTEROL  . cholecalciferol (VITAMIN D) 1000 units tablet Take 2,000 Units by mouth daily.  . Esomeprazole Magnesium (NEXIUM PO) Take by mouth.  . fluticasone (FLONASE) 50 MCG/ACT nasal spray Place 2 sprays into both nostrils daily.  Marland Kitchen levothyroxine (SYNTHROID, LEVOTHROID) 88 MCG tablet TAKE 1 TABLET IN THE MORNING FOR THYROID   No facility-administered encounter medications on file as of 07/30/2018.     Review of Systems  Constitutional: Negative for appetite change and unexpected weight change.  HENT: Negative for  congestion and sinus pressure.   Respiratory: Negative for cough, chest tightness and shortness of breath.   Cardiovascular: Negative for chest pain, palpitations and leg swelling.  Gastrointestinal: Negative for abdominal pain, diarrhea, nausea and vomiting.  Genitourinary: Negative for difficulty urinating and dysuria.  Musculoskeletal: Negative for joint swelling and myalgias.  Skin: Negative for color change and rash.  Neurological: Negative for headaches.       No significant problem with dizziness.    Psychiatric/Behavioral: Negative for agitation and dysphoric mood.       Objective:    Physical Exam  Constitutional: She appears well-developed and well-nourished. No distress.  HENT:  Nose: Nose normal.  Mouth/Throat: Oropharynx is clear and moist.  Neck: Neck supple. No thyromegaly present.  Cardiovascular: Normal rate and regular rhythm.  Pulmonary/Chest: Breath sounds normal. No respiratory distress. She has no wheezes.  Abdominal:  Soft. Bowel sounds are normal. There is no tenderness.  Musculoskeletal: She exhibits no edema or tenderness.  Lymphadenopathy:    She has no cervical adenopathy.  Skin: No rash noted. No erythema.  Psychiatric: She has a normal mood and affect. Her behavior is normal.    BP 122/70 (BP Location: Left Arm, Patient Position: Sitting, Cuff Size: Normal)   Pulse 84   Temp 98.2 F (36.8 C) (Oral)   Resp 18   Wt 109 lb 9.6 oz (49.7 kg)   SpO2 97%   BMI 21.40 kg/m  Wt Readings from Last 3 Encounters:  07/30/18 109 lb 9.6 oz (49.7 kg)  03/26/18 109 lb (49.4 kg)  12/22/17 114 lb 6.4 oz (51.9 kg)     Lab Results  Component Value Date   WBC 9.6 02/11/2018   HGB 13.1 02/11/2018   HCT 38.7 02/11/2018   PLT 300.0 02/11/2018   GLUCOSE 94 02/11/2018   CHOL 162 02/11/2018   TRIG 82.0 02/11/2018   HDL 58.10 02/11/2018   LDLCALC 87 02/11/2018   ALT 21 02/11/2018   AST 23 02/11/2018   NA 141 02/11/2018   K 4.9 04/05/2018   CL 105 02/11/2018   CREATININE 0.70 02/11/2018   BUN 9 02/11/2018   CO2 28 02/11/2018   TSH 2.59 02/11/2018    Ct Chest Lung Cancer Screening Low Dose Wo Contrast  Result Date: 11/16/2017 CLINICAL DATA:  Current smoker, 46 pack-year history, lung cancer screening. EXAM: CT CHEST WITHOUT CONTRAST LOW-DOSE FOR LUNG CANCER SCREENING TECHNIQUE: Multidetector CT imaging of the chest was performed following the standard protocol without IV contrast. COMPARISON:  11/11/2016. FINDINGS: Cardiovascular: Atherosclerotic calcification of the arterial vasculature, including coronary arteries. Heart size normal. No pericardial effusion. Mediastinum/Nodes: No pathologically enlarged mediastinal or axillary lymph nodes. Hilar regions are difficult to definitively evaluate without IV contrast but appear grossly unremarkable. Esophagus is grossly unremarkable. Lungs/Pleura: Biapical pleuroparenchymal scarring. Mild to moderate centrilobular emphysema. Peripheral pulmonary nodules measure  2 mm or less in size. No new pulmonary nodules. No pleural fluid. Airway is unremarkable. Upper Abdomen: Subcentimeter low-attenuation lesion in the left hepatic lobe is too small to characterize. Adrenal glands are unremarkable. Low and high attenuating lesions in the kidneys measure up to 9 mm on the right, too small to characterize. Visualized portions of the spleen, pancreas, stomach and bowel are grossly unremarkable. No upper abdominal adenopathy. Musculoskeletal: No worrisome lytic or sclerotic lesions. IMPRESSION: 1. Lung-RADS 2, benign appearance or behavior. Continue annual screening with low-dose chest CT without contrast in 12 months. 2. Aortic atherosclerosis (ICD10-170.0). Coronary artery calcification. 3.  Emphysema (ICD10-J43.9). Electronically Signed  By: Lorin Picket M.D.   On: 11/16/2017 08:18       Assessment & Plan:   Problem List Items Addressed This Visit    Anxiety    Doing well.  Handling stress.  Son and grandson are doing better.        Aortic atherosclerosis ([facility])    On lipitor.        Hypercholesterolemia    Low cholesterol diet and exercise.  Follow lipid panel and liver function tests.  On lipitor.        Relevant Orders   Hepatic function panel   Lipid panel   Basic metabolic panel   Hypothyroidism    On thyroid replacement.  Follow tsh.       Stress    Weight is stable from the last check.  Overall she feels she is doing relatively well.  Son and grandson are doing better.  Follow.            Einar Pheasant, MD

## 2018-08-03 ENCOUNTER — Encounter: Payer: Self-pay | Admitting: Internal Medicine

## 2018-08-03 NOTE — Assessment & Plan Note (Signed)
On lipitor

## 2018-08-03 NOTE — Assessment & Plan Note (Signed)
Doing well.  Handling stress.  Son and grandson are doing better.

## 2018-08-03 NOTE — Assessment & Plan Note (Signed)
Weight is stable from the last check.  Overall she feels she is doing relatively well.  Son and grandson are doing better.  Follow.

## 2018-08-03 NOTE — Assessment & Plan Note (Signed)
Low cholesterol diet and exercise.  Follow lipid panel and liver function tests.  On lipitor.   

## 2018-08-03 NOTE — Assessment & Plan Note (Signed)
On thyroid replacement.  Follow tsh.  

## 2018-08-20 ENCOUNTER — Other Ambulatory Visit (INDEPENDENT_AMBULATORY_CARE_PROVIDER_SITE_OTHER): Payer: Medicare Other

## 2018-08-20 DIAGNOSIS — E7800 Pure hypercholesterolemia, unspecified: Secondary | ICD-10-CM | POA: Diagnosis not present

## 2018-08-20 LAB — LIPID PANEL
CHOLESTEROL: 174 mg/dL (ref 0–200)
HDL: 63.7 mg/dL (ref >39.00)
LDL Cholesterol: 91 mg/dL (ref 0–99)
NonHDL: 109.82
TRIGLYCERIDES: 95 mg/dL (ref 0.0–149.0)
Total CHOL/HDL Ratio: 3
VLDL: 19 mg/dL (ref 0.0–40.0)

## 2018-08-20 LAB — HEPATIC FUNCTION PANEL
ALBUMIN: 4.5 g/dL (ref 3.5–5.2)
ALT: 17 U/L (ref 0–35)
AST: 17 U/L (ref 0–37)
Alkaline Phosphatase: 55 U/L (ref 39–117)
BILIRUBIN DIRECT: 0.1 mg/dL (ref 0.0–0.3)
TOTAL PROTEIN: 7.1 g/dL (ref 6.0–8.3)
Total Bilirubin: 0.6 mg/dL (ref 0.2–1.2)

## 2018-08-20 LAB — BASIC METABOLIC PANEL
BUN: 11 mg/dL (ref 6–23)
CHLORIDE: 104 meq/L (ref 96–112)
CO2: 30 meq/L (ref 19–32)
Calcium: 9.8 mg/dL (ref 8.4–10.5)
Creatinine, Ser: 0.75 mg/dL (ref 0.40–1.20)
GFR: 81.88 mL/min (ref >60.00)
GLUCOSE: 92 mg/dL (ref 70–99)
POTASSIUM: 5 meq/L (ref 3.5–5.1)
SODIUM: 140 meq/L (ref 135–145)

## 2018-08-23 ENCOUNTER — Other Ambulatory Visit: Payer: Self-pay | Admitting: Internal Medicine

## 2018-08-23 DIAGNOSIS — E875 Hyperkalemia: Secondary | ICD-10-CM

## 2018-08-23 NOTE — Progress Notes (Signed)
Order placed for f/u potassium.  

## 2018-09-03 ENCOUNTER — Other Ambulatory Visit (INDEPENDENT_AMBULATORY_CARE_PROVIDER_SITE_OTHER): Payer: Medicare Other

## 2018-09-03 DIAGNOSIS — E875 Hyperkalemia: Secondary | ICD-10-CM

## 2018-09-03 LAB — POTASSIUM: Potassium: 4.4 mEq/L (ref 3.5–5.1)

## 2018-09-04 ENCOUNTER — Encounter: Payer: Self-pay | Admitting: Internal Medicine

## 2018-11-05 ENCOUNTER — Telehealth: Payer: Self-pay

## 2018-11-05 NOTE — Telephone Encounter (Signed)
Call pt regarding lung screening. Pt is a current smoker , smoking about 1/2 pack per day. Pt would like scan on Feb. 28 th after 1 pm. Pt has no new health issue at this time.

## 2018-11-06 ENCOUNTER — Encounter: Payer: Self-pay | Admitting: *Deleted

## 2018-11-06 ENCOUNTER — Telehealth: Payer: Self-pay | Admitting: *Deleted

## 2018-11-06 DIAGNOSIS — Z122 Encounter for screening for malignant neoplasm of respiratory organs: Secondary | ICD-10-CM

## 2018-11-06 NOTE — Telephone Encounter (Signed)
Patient has been notified that the annual lung cancer screening low dose CT scan is due currently or will be in the near future.  Confirmed that the patient is within the age range of 31-80, and asymptomatic, and currently exhibits no signs or symptoms of lung cancer.  Patient denies illness that would prevent curative treatment for lung cancer if found.  Verified smoking history, current smoker 1/2 ppd with 47pkyr history.  The shared decision making visit was completed on 11-11-16.  Patient is agreeable for the CT scan to be scheduled.  Will call patient back with date and time of appointment.

## 2018-12-02 ENCOUNTER — Telehealth: Payer: Self-pay | Admitting: *Deleted

## 2018-12-02 NOTE — Telephone Encounter (Signed)
Called pt to inform her of her appt for ldct screening onFriday 12/10/2018 @ 2:40pm here @ [facility], voiced understanding.

## 2018-12-08 ENCOUNTER — Encounter: Payer: Self-pay | Admitting: *Deleted

## 2018-12-10 ENCOUNTER — Ambulatory Visit (INDEPENDENT_AMBULATORY_CARE_PROVIDER_SITE_OTHER): Payer: Medicare Other | Admitting: Internal Medicine

## 2018-12-10 ENCOUNTER — Encounter: Payer: Self-pay | Admitting: *Deleted

## 2018-12-10 ENCOUNTER — Ambulatory Visit
Admission: RE | Admit: 2018-12-10 | Discharge: 2018-12-10 | Disposition: A | Discharge status: Home / Self Care | Payer: Medicare Other | Source: Ambulatory Visit | Attending: Oncology | Admitting: Oncology

## 2018-12-10 ENCOUNTER — Encounter: Payer: Self-pay | Admitting: Internal Medicine

## 2018-12-10 VITALS — BP 116/74 | HR 69 | Temp 97.9°F | Resp 16 | Wt 112.6 lb

## 2018-12-10 DIAGNOSIS — E039 Hypothyroidism, unspecified: Secondary | ICD-10-CM

## 2018-12-10 DIAGNOSIS — Z122 Encounter for screening for malignant neoplasm of respiratory organs: Secondary | ICD-10-CM | POA: Diagnosis not present

## 2018-12-10 DIAGNOSIS — F17210 Nicotine dependence, cigarettes, uncomplicated: Secondary | ICD-10-CM | POA: Insufficient documentation

## 2018-12-10 DIAGNOSIS — E7800 Pure hypercholesterolemia, unspecified: Secondary | ICD-10-CM

## 2018-12-10 DIAGNOSIS — M545 Low back pain, unspecified: Secondary | ICD-10-CM

## 2018-12-10 DIAGNOSIS — I700 Atherosclerosis of aorta: Secondary | ICD-10-CM | POA: Diagnosis not present

## 2018-12-10 DIAGNOSIS — F419 Anxiety disorder, unspecified: Secondary | ICD-10-CM | POA: Diagnosis not present

## 2018-12-10 DIAGNOSIS — F439 Reaction to severe stress, unspecified: Secondary | ICD-10-CM

## 2018-12-10 LAB — BASIC METABOLIC PANEL
BUN: 7 mg/dL (ref 6–23)
CO2: 30 mEq/L (ref 19–32)
Calcium: 10.1 mg/dL (ref 8.4–10.5)
Chloride: 105 mEq/L (ref 96–112)
Creatinine, Ser: 0.74 mg/dL (ref 0.40–1.20)
GFR: 78.17 mL/min (ref >60.00)
Glucose, Bld: 90 mg/dL (ref 70–99)
Potassium: 4.8 mEq/L (ref 3.5–5.1)
SODIUM: 142 meq/L (ref 135–145)

## 2018-12-10 LAB — LIPID PANEL
CHOL/HDL RATIO: 2
Cholesterol: 163 mg/dL (ref 0–200)
HDL: 66 mg/dL (ref >39.00)
LDL CALC: 76 mg/dL (ref 0–99)
NONHDL: 96.58
Triglycerides: 104 mg/dL (ref 0.0–149.0)
VLDL: 20.8 mg/dL (ref 0.0–40.0)

## 2018-12-10 LAB — HEPATIC FUNCTION PANEL
ALK PHOS: 58 U/L (ref 39–117)
ALT: 17 U/L (ref 0–35)
AST: 16 U/L (ref 0–37)
Albumin: 4.6 g/dL (ref 3.5–5.2)
BILIRUBIN TOTAL: 0.6 mg/dL (ref 0.2–1.2)
Bilirubin, Direct: 0.1 mg/dL (ref 0.0–0.3)
Total Protein: 6.9 g/dL (ref 6.0–8.3)

## 2018-12-10 MED ORDER — MELOXICAM 7.5 MG PO TABS: 7.5000 mg | ORAL_TABLET | Freq: Every day | ORAL | 0 refills | Status: DC

## 2018-12-10 NOTE — Progress Notes (Signed)
Patient ID: Leah Benitez, female   DOB: November 01, 1950, 68 y.o.   MRN: [id number]   Subjective:    Patient ID: Leah Benitez, female    DOB: Oct 12, 1951, 68 y.o.   MRN: [id number]  HPI  Patient here for a scheduled follow up.  She reports she has been doing relatively well.  Staying active.  Did hurt her back recently.  Moving a large vinyl mat.  Noticed some left lower back pain.  No radiation of pain.  Notices pain more with bending and moving up and down.  Took some of her sons meloxicam 15mg .  Helped.  Still with pain, but is some better.  No fall.  No chest pain.  No sob.  No acid reflux.  No abdominal pain.  Bowels moving.  No urine change.  Mammogram scheduled for 12/31/18.     Past Medical History:  Diagnosis Date  . Allergy   . Hyperlipidemia   . Thyroid disease    Past Surgical History:  Procedure Laterality Date  . ABDOMINAL HYSTERECTOMY     previous abnormal pap smear  . BREAST BIOPSY    . TONSILLECTOMY     Family History  Problem Relation Age of Onset  . Arthritis Mother   . Stroke Mother   . Hypertension Mother   . Arthritis Father   . Heart disease Father   . Heart disease Brother   . Cancer Maternal Aunt        breast   Social History   Socioeconomic History  . Marital status: Married    Spouse name: Not on file  . Number of children: Not on file  . Years of education: Not on file  . Highest education level: Not on file  Occupational History  . Not on file  Social Needs  . Financial resource strain: Not on file  . Food insecurity:    Worry: Not on file    Inability: Not on file  . Transportation needs:    Medical: Not on file    Non-medical: Not on file  Tobacco Use  . Smoking status: Current Every Day Smoker    Packs/day: 1.00    Years: 47.00    Pack years: 47.00    Types: Cigarettes  . Smokeless tobacco: Never Used  Substance and Sexual Activity  . Alcohol use: Not on file  . Drug use: Not on file  . Sexual activity: Not on file  Lifestyle  .  Physical activity:    Days per week: Not on file    Minutes per session: Not on file  . Stress: Not on file  Relationships  . Social connections:    Talks on phone: Not on file    Gets together: Not on file    Attends religious service: Not on file    Active member of club or organization: Not on file    Attends meetings of clubs or organizations: Not on file    Relationship status: Not on file  Other Topics Concern  . Not on file  Social History Narrative  . Not on file    Outpatient Encounter Medications as of 12/10/2018  Medication Sig  . atorvastatin (LIPITOR) 20 MG tablet TAKE 1 TABLET BY MOUTH AT BEDTIME FOR CHOLESTEROL  . cholecalciferol (VITAMIN D) 1000 units tablet Take 2,000 Units by mouth daily.  . Esomeprazole Magnesium (NEXIUM PO) Take by mouth.  . fluticasone (FLONASE) 50 MCG/ACT nasal spray Place 2 sprays into both nostrils daily.  Marland Kitchen levothyroxine (SYNTHROID, LEVOTHROID)  88 MCG tablet TAKE 1 TABLET IN THE MORNING FOR THYROID  . meloxicam (MOBIC) 7.5 MG tablet Take 1 tablet (7.5 mg total) by mouth daily.   No facility-administered encounter medications on file as of 12/10/2018.     Review of Systems  Constitutional: Negative for appetite change and unexpected weight change.  HENT: Negative for congestion and sinus pressure.   Respiratory: Negative for cough, chest tightness and shortness of breath.   Cardiovascular: Negative for chest pain, palpitations and leg swelling.  Gastrointestinal: Negative for abdominal pain, diarrhea, nausea and vomiting.  Genitourinary: Negative for difficulty urinating and dysuria.  Musculoskeletal: Positive for back pain. Negative for joint swelling and myalgias.  Skin: Negative for color change and rash.  Neurological: Negative for dizziness, light-headedness and headaches.  Psychiatric/Behavioral: Negative for agitation and dysphoric mood.       Objective:    Physical Exam Constitutional:      General: She is not in acute  distress.    Appearance: Normal appearance.  HENT:     Nose: Nose normal. No congestion.     Mouth/Throat:     Pharynx: No oropharyngeal exudate or posterior oropharyngeal erythema.  Neck:     Musculoskeletal: Neck supple. No muscular tenderness.     Thyroid: No thyromegaly.  Cardiovascular:     Rate and Rhythm: Normal rate and regular rhythm.  Pulmonary:     Effort: No respiratory distress.     Breath sounds: Normal breath sounds. No wheezing.  Abdominal:     General: Bowel sounds are normal.     Palpations: Abdomen is soft.     Tenderness: There is no abdominal tenderness.  Musculoskeletal:        General: No swelling or tenderness.     Comments: No pain with palpation.  Some increased discomfort in her left lower back with going from sitting to standing position.  No pain with walking.    Lymphadenopathy:     Cervical: No cervical adenopathy.  Skin:    Findings: No erythema or rash.  Neurological:     Mental Status: She is alert.  Psychiatric:        Mood and Affect: Mood normal.        Behavior: Behavior normal.     BP 116/74   Pulse 69   Temp 97.9 F (36.6 C) (Oral)   Resp 16   Wt 112 lb 9.6 oz (51.1 kg)   SpO2 97%   BMI 21.99 kg/m  Wt Readings from Last 3 Encounters:  12/10/18 112 lb (50.8 kg)  12/10/18 112 lb 9.6 oz (51.1 kg)  07/30/18 109 lb 9.6 oz (49.7 kg)     Lab Results  Component Value Date   WBC 9.6 02/11/2018   HGB 13.1 02/11/2018   HCT 38.7 02/11/2018   PLT 300.0 02/11/2018   GLUCOSE 90 12/10/2018   CHOL 163 12/10/2018   TRIG 104.0 12/10/2018   HDL 66.00 12/10/2018   LDLCALC 76 12/10/2018   ALT 17 12/10/2018   AST 16 12/10/2018   NA 142 12/10/2018   K 4.8 12/10/2018   CL 105 12/10/2018   CREATININE 0.74 12/10/2018   BUN 7 12/10/2018   CO2 30 12/10/2018   TSH 2.59 02/11/2018    Ct Chest Lung Cancer Screening Low Dose Wo Contrast  Result Date: 11/16/2017 CLINICAL DATA:  Current smoker, 46 pack-year history, lung cancer screening.  EXAM: CT CHEST WITHOUT CONTRAST LOW-DOSE FOR LUNG CANCER SCREENING TECHNIQUE: Multidetector CT imaging of the chest was performed  following the standard protocol without IV contrast. COMPARISON:  11/11/2016. FINDINGS: Cardiovascular: Atherosclerotic calcification of the arterial vasculature, including coronary arteries. Heart size normal. No pericardial effusion. Mediastinum/Nodes: No pathologically enlarged mediastinal or axillary lymph nodes. Hilar regions are difficult to definitively evaluate without IV contrast but appear grossly unremarkable. Esophagus is grossly unremarkable. Lungs/Pleura: Biapical pleuroparenchymal scarring. Mild to moderate centrilobular emphysema. Peripheral pulmonary nodules measure 2 mm or less in size. No new pulmonary nodules. No pleural fluid. Airway is unremarkable. Upper Abdomen: Subcentimeter low-attenuation lesion in the left hepatic lobe is too small to characterize. Adrenal glands are unremarkable. Low and high attenuating lesions in the kidneys measure up to 9 mm on the right, too small to characterize. Visualized portions of the spleen, pancreas, stomach and bowel are grossly unremarkable. No upper abdominal adenopathy. Musculoskeletal: No worrisome lytic or sclerotic lesions. IMPRESSION: 1. Lung-RADS 2, benign appearance or behavior. Continue annual screening with low-dose chest CT without contrast in 12 months. 2. Aortic atherosclerosis (ICD10-170.0). Coronary artery calcification. 3.  Emphysema (ICD10-J43.9). Electronically Signed   By: Lorin Picket M.D.   On: 11/16/2017 08:18       Assessment & Plan:   Problem List Items Addressed This Visit    Anxiety    Doing better.  Follow.        Aortic atherosclerosis (HCC)    On lipitor.        Hypercholesterolemia - Primary    Low cholesterol diet and exercise.  Follow lipid panel and liver function tests.  On lipitor.        Relevant Orders   Hepatic function panel (Completed)   Lipid panel (Completed)    Basic metabolic panel (Completed)   Hypothyroidism    On thyroid replacement.  Follow tsh.        Low back pain    Left low back pain as outlined.  Request refill on meloxicam.  Discussed possible risk of meloxicam and possible side effects.  meloxicam 7.5mg  q day for short course.  Pain improved.  Hold on xray.  Follow.  Call with update.        Relevant Medications   meloxicam (MOBIC) 7.5 MG tablet   Stress    Stable.  Overall feels she is doing well.            Einar Pheasant, MD

## 2018-12-11 ENCOUNTER — Encounter: Payer: Self-pay | Admitting: Internal Medicine

## 2018-12-12 DIAGNOSIS — M545 Low back pain, unspecified: Secondary | ICD-10-CM | POA: Insufficient documentation

## 2018-12-12 NOTE — Assessment & Plan Note (Signed)
On thyroid replacement.  Follow tsh.  

## 2018-12-12 NOTE — Assessment & Plan Note (Signed)
Doing better.  Follow.   

## 2018-12-12 NOTE — Assessment & Plan Note (Signed)
Low cholesterol diet and exercise.  Follow lipid panel and liver function tests.  On lipitor.   

## 2018-12-12 NOTE — Assessment & Plan Note (Signed)
On lipitor

## 2018-12-12 NOTE — Assessment & Plan Note (Signed)
Left low back pain as outlined.  Request refill on meloxicam.  Discussed possible risk of meloxicam and possible side effects.  meloxicam 7.5mg  q day for short course.  Pain improved.  Hold on xray.  Follow.  Call with update.

## 2018-12-12 NOTE — Assessment & Plan Note (Signed)
Stable.  Overall feels she is doing well.

## 2018-12-13 ENCOUNTER — Encounter: Payer: Self-pay | Admitting: *Deleted

## 2018-12-24 DIAGNOSIS — Z1231 Encounter for screening mammogram for malignant neoplasm of breast: Secondary | ICD-10-CM | POA: Diagnosis not present

## 2018-12-24 LAB — HM MAMMOGRAPHY

## 2019-02-02 ENCOUNTER — Other Ambulatory Visit: Payer: Self-pay

## 2019-02-02 ENCOUNTER — Ambulatory Visit (INDEPENDENT_AMBULATORY_CARE_PROVIDER_SITE_OTHER): Payer: Medicare Other

## 2019-02-02 DIAGNOSIS — Z0000 Encounter for general adult medical examination without abnormal findings: Secondary | ICD-10-CM

## 2019-02-02 NOTE — Progress Notes (Signed)
Subjective:   Leah Benitez is a 68 y.o. female who presents for an Initial Medicare Annual Wellness Visit.  Review of Systems    No ROS.  Medicare Wellness Visit. Additional risk factors are reflected in the social history.  Cardiac Risk Factors include: advanced age (>56men, >53 women);smoking/ tobacco exposure     Objective:    Today's Vitals   There is no height or weight on file to calculate BMI. UTA vitals, virtual visit.  Advanced Directives 02/02/2019  Does Patient Have a Medical Advance Directive? No  Would patient like information on creating a medical advance directive? Yes (MAU/Ambulatory/Procedural Areas - Information given)    Current Medications (verified) Outpatient Encounter Medications as of 02/02/2019  Medication Sig   atorvastatin (LIPITOR) 20 MG tablet TAKE 1 TABLET BY MOUTH AT BEDTIME FOR CHOLESTEROL   cholecalciferol (VITAMIN D) 1000 units tablet Take 2,000 Units by mouth daily.   Esomeprazole Magnesium (NEXIUM PO) Take by mouth.   fluticasone (FLONASE) 50 MCG/ACT nasal spray Place 2 sprays into both nostrils daily.   levothyroxine (SYNTHROID, LEVOTHROID) 88 MCG tablet TAKE 1 TABLET IN THE MORNING FOR THYROID   meloxicam (MOBIC) 7.5 MG tablet Take 1 tablet (7.5 mg total) by mouth daily.   No facility-administered encounter medications on file as of 02/02/2019.     Allergies (verified) Actonel [risedronate sodium] and Penicillins   History: Past Medical History:  Diagnosis Date   Allergy    Hyperlipidemia    Thyroid disease    Past Surgical History:  Procedure Laterality Date   ABDOMINAL HYSTERECTOMY     previous abnormal pap smear   BREAST BIOPSY     TONSILLECTOMY     Family History  Problem Relation Age of Onset   Arthritis Mother    Stroke Mother    Hypertension Mother    Arthritis Father    Heart disease Father    Heart disease Brother    Cancer Maternal Aunt        breast   Social History   Socioeconomic  History   Marital status: Married    Spouse name: Not on file   Number of children: Not on file   Years of education: Not on file   Highest education level: Not on file  Occupational History   Not on file  Social Needs   Financial resource strain: Not hard at all   Food insecurity:    Worry: Never true    Inability: Never true   Transportation needs:    Medical: No    Non-medical: No  Tobacco Use   Smoking status: Current Every Day Smoker    Packs/day: 1.00    Years: 47.00    Pack years: 47.00    Types: Cigarettes   Smokeless tobacco: Never Used  Substance and Sexual Activity   Alcohol use: Yes    Alcohol/week: 7.0 standard drinks    Types: 7 Glasses of wine per week    Comment: 1 glass of wine per night   Drug use: Not on file   Sexual activity: Not on file  Lifestyle   Physical activity:    Days per week: Not on file    Minutes per session: Not on file   Stress: Not at all  Relationships   Social connections:    Talks on phone: Not on file    Gets together: Not on file    Attends religious service: Not on file    Active member of club or organization: Not  on file    Attends meetings of clubs or organizations: Not on file    Relationship status: Not on file  Other Topics Concern   Not on file  Social History Narrative   Not on file    Tobacco Counseling Ready to quit: Not Answered Counseling given: Not Answered   Clinical Intake:  Pre-visit preparation completed: Yes        Diabetes: No  How often do you need to have someone help you when you read instructions, pamphlets, or other written materials from your doctor or pharmacy?: 1 - Never         Activities of Daily Living In your present state of health, do you have any difficulty performing the following activities: 02/02/2019  Hearing? N  Vision? N  Difficulty concentrating or making decisions? N  Walking or climbing stairs? N  Dressing or bathing? N  Doing errands,  shopping? N  Preparing Food and eating ? N  Using the Toilet? N  In the past six months, have you accidently leaked urine? N  Do you have problems with loss of bowel control? N  Managing your Medications? N  Managing your Finances? N  Housekeeping or managing your Housekeeping? N  Some recent data might be hidden     Immunizations and Health Maintenance Immunization History  Administered Date(s) Administered   Influenza-Unspecified 07/30/2018   Pneumococcal Polysaccharide-23 10/13/2016   Tdap 01/19/2017   Health Maintenance Due  Topic Date Due   Hepatitis C Screening  [birth year]   COLONOSCOPY  06/16/2001   PNA vac Low Risk Adult (2 of 2 - PCV13) 10/13/2017    Patient Care Team: Einar Pheasant, MD as PCP - General (Internal Medicine)  Indicate any recent Medical Services you may have received from other than [facility] providers in the past year (date may be approximate).     Assessment:   This is a routine wellness examination for Leah Benitez.  I connected with patient 02/02/19 at  9:30 AM EDT by a video enabled telemedicine application and verified that I am speaking with the correct person using two identifiers. Patient stated full name and DOB. Patient gave permission to continue with virtual visit. Patient's location was at home and Nurse's location was at [place] office.   Health Screenings  Mammogram -12/24/18 Colonoscopy -plans to discuss and schedule with pcp. Bone Density -02/26/17 Glaucoma -none Hearing -demonstrates normal hearing during conversation. Hemoglobin A1C - Cholesterol -12/10/18  Dental- every 6 months Vision- every 12 months Hepatitis C screening -discussed and she will consider.   Social  Alcohol intake -yes, 1 glass of red wine per night Smoking history- current. She is not ready to quit.  Smokers in home? self Illicit drug use? none Exercise -walking 2-3 days per week, 20-30 minutes Diet -low cholesterol, low sodium Sexually Active  -yes Multiple Partners -no  Safety  Patient feels safe at home.  Patient does have smoke detectors at home  Patient does wear sunscreen or protective clothing when in direct sunlight  Patient does wear seat belt when driving or riding with others.   Activities of Daily Living Patient can do their own household chores. Denies needing assistance with: driving, feeding themselves, getting from bed to chair, getting to the toilet, bathing/showering, dressing, managing money, climbing flight of stairs, or preparing meals. She paces herself with ADLs.  Depression Screen Patient denies losing interest in daily life, feeling hopeless, or crying easily over simple problems.   Fall Screen Patient denies being afraid of falling  or falling in the last year.   Memory Screen Patient denies problems with memory, misplacing items, and is able to balance checkbook/bank accounts.  Patient is alert, normal appearance, oriented to person/place/and time. Correctly identified the president of the [place], recall of 2/3 objects, and performing simple calculations.  Patient displays appropriate judgement and can read correct time from watch face.   Immunizations The following Immunizations were discussed: Influenza, shingles, pneumonia, and tetanus.   Other Providers Patient Care Team: Einar Pheasant, MD as PCP - General (Internal Medicine)  Hearing/Vision screen Hearing Screening Comments: Patient is able to hear conversational tones without difficulty.  No issues reported.  Vision Screening Comments: Followed by [facility] Wears corrective lenses Annual visits No screening, virtual visit. She has regular follow up with her ophthalmologist.   Dietary issues and exercise activities discussed: Current Exercise Habits: Home exercise routine, Time (Minutes): 30, Frequency (Times/Week): 3, Weekly Exercise (Minutes/Week): 90, Intensity: Mild  Goals      Patient Stated    DIET - INCREASE WATER INTAKE  (pt-stated)      Depression Screen PHQ 2/9 Scores 02/02/2019 08/28/2017 08/21/2016  PHQ - 2 Score 0 0 0  PHQ- 9 Score - 0 -    Fall Risk Fall Risk  02/02/2019 08/28/2017 08/21/2016  Falls in the past year? 0 Yes No  Number falls in past yr: - 1 -  Injury with Fall? - Yes -  Risk for fall due to : - Impaired balance/gait -  Follow up - Falls prevention discussed -    Cognitive Function:     6CIT Screen 02/02/2019  What Year? 0 points  What month? 0 points  What time? 0 points  Count back from 20 0 points  Months in reverse 0 points  Repeat phrase 0 points  Total Score 0    Screening Tests Health Maintenance  Topic Date Due   Hepatitis C Screening  Feb 23, 1951   COLONOSCOPY  06/16/2001   PNA vac Low Risk Adult (2 of 2 - PCV13) 10/13/2017   INFLUENZA VACCINE  05/14/2019   MAMMOGRAM  12/23/2020   TETANUS/TDAP  01/20/2027   DEXA SCAN  Completed      Plan:    End of life planning; Advance aging; Advanced directives discussed. Copy of current HCPOA/Living Will requested upon completion.    I have personally reviewed and noted the following in the patients chart:    Medical and social history  Use of alcohol, tobacco or illicit drugs   Current medications and supplements  Functional ability and status  Nutritional status  Physical activity  Advanced directives  List of other physicians  Hospitalizations, surgeries, and ER visits in previous 12 months  Vitals  Screenings to include cognitive, depression, and falls  Referrals and appointments  In addition, I have reviewed and discussed with patient certain preventive protocols, quality metrics, and best practice recommendations. A written personalized care plan for preventive services as well as general preventive health recommendations were provided to patient.     Varney Biles, LPN   2/99/2426    Reviewed above information.  Agree with assessment and plan.    Dr Nicki Reaper

## 2019-02-02 NOTE — Patient Instructions (Addendum)
  Leah Benitez , Thank you for taking time to come for your Medicare Wellness Visit. I appreciate your ongoing commitment to your health goals. Please review the following plan we discussed and let me know if I can assist you in the future.   Keep all routine scheduled appointments.  These are the goals we discussed: Goals      Patient Stated   . DIET - INCREASE WATER INTAKE (pt-stated)       This is a list of the screening recommended for you and due dates:  Health Maintenance  Topic Date Due  .  Hepatitis C: One time screening is recommended by Center for Disease Control  (CDC) for  adults born from [birth year] through 1965.   October 06, 1951  . Colon Cancer Screening  06/16/2001  . Pneumonia vaccines (2 of 2 - PCV13) 10/13/2017  . Flu Shot  05/14/2019  . Mammogram  12/23/2020  . Tetanus Vaccine  01/20/2027  . DEXA scan (bone density measurement)  Completed

## 2019-02-24 ENCOUNTER — Other Ambulatory Visit: Payer: Self-pay | Admitting: Internal Medicine

## 2019-04-13 DIAGNOSIS — E038 Other specified hypothyroidism: Secondary | ICD-10-CM | POA: Diagnosis not present

## 2019-04-13 DIAGNOSIS — H8110 Benign paroxysmal vertigo, unspecified ear: Secondary | ICD-10-CM | POA: Diagnosis not present

## 2019-04-13 DIAGNOSIS — F064 Anxiety disorder due to known physiological condition: Secondary | ICD-10-CM | POA: Diagnosis not present

## 2019-04-13 DIAGNOSIS — R51 Headache: Secondary | ICD-10-CM | POA: Diagnosis not present

## 2019-04-20 DIAGNOSIS — H8110 Benign paroxysmal vertigo, unspecified ear: Secondary | ICD-10-CM | POA: Diagnosis not present

## 2019-04-22 ENCOUNTER — Encounter: Payer: Self-pay | Admitting: Internal Medicine

## 2019-04-22 ENCOUNTER — Ambulatory Visit (INDEPENDENT_AMBULATORY_CARE_PROVIDER_SITE_OTHER): Payer: Medicare Other | Admitting: Internal Medicine

## 2019-04-22 ENCOUNTER — Other Ambulatory Visit: Payer: Self-pay

## 2019-04-22 DIAGNOSIS — R42 Dizziness and giddiness: Secondary | ICD-10-CM | POA: Diagnosis not present

## 2019-04-22 DIAGNOSIS — T148XXA Other injury of unspecified body region, initial encounter: Secondary | ICD-10-CM

## 2019-04-22 DIAGNOSIS — Z9109 Other allergy status, other than to drugs and biological substances: Secondary | ICD-10-CM | POA: Diagnosis not present

## 2019-04-22 DIAGNOSIS — I700 Atherosclerosis of aorta: Secondary | ICD-10-CM | POA: Diagnosis not present

## 2019-04-22 DIAGNOSIS — E7800 Pure hypercholesterolemia, unspecified: Secondary | ICD-10-CM | POA: Diagnosis not present

## 2019-04-22 DIAGNOSIS — E039 Hypothyroidism, unspecified: Secondary | ICD-10-CM | POA: Diagnosis not present

## 2019-04-22 DIAGNOSIS — S80819A Abrasion, unspecified lower leg, initial encounter: Secondary | ICD-10-CM

## 2019-04-22 NOTE — Progress Notes (Signed)
Patient ID: Leah Benitez, female   DOB: 12-02-1950, 68 y.o.   MRN: [id number]   Virtual Visit via telephone Note  This visit type was conducted due to national recommendations for restrictions regarding the COVID-19 pandemic (e.g. social distancing).  This format is felt to be most appropriate for this patient at this time.  All issues noted in this document were discussed and addressed.  No physical exam was performed (except for noted visual exam findings with Video Visits).   I connected with Leah Benitez by telephone and verified that I am speaking with the correct person using two identifiers. Location patient: home Location provider: work  Persons participating in the telephonel visit: patient, provider  I discussed the limitations, risks, security and privacy concerns of performing an evaluation and management service by telephone and the availability of in person appointments. The patient expressed understanding and agreed to proceed.   Reason for visit: scheduled follow up  HPI: She reports she is doing relatively well.  She reports that a coworker tested positive for covid.  She was tested 04/04/19 - negative.  Denies any fever.  No cough, congestion or sob.  Stays active.  No chest pain with increased activity or exertion.  No acid reflux.  No abdominal pain. Bowels moving.  Has a history of vertigo.  Has been worked up extensively.  Had flare two weeks ago.  States occurred after turning over in bed.  S/p epley maneuvers.  Better/resolved.  Has f/u next week.  No headache.  She also hit her leg on a piece of marble.  Scraped down her shin.  Occurred 10 days ago.  Area involved - 2 inches by 1/3 inch.  Has been using saline and a mixture of distilled water and hydrogen peroxide.  Has been applying abx ointment.  Up to date with tetanus.     ROS: See pertinent positives and negatives per HPI.  Past Medical History:  Diagnosis Date  . Allergy   . Hyperlipidemia   . Thyroid disease     Past Surgical History:  Procedure Laterality Date  . ABDOMINAL HYSTERECTOMY     previous abnormal pap smear  . BREAST BIOPSY    . TONSILLECTOMY      Family History  Problem Relation Age of Onset  . Arthritis Mother   . Stroke Mother   . Hypertension Mother   . Arthritis Father   . Heart disease Father   . Heart disease Brother   . Cancer Maternal Aunt        breast    SOCIAL HX: reviewed.    Current Outpatient Medications:  .  atorvastatin (LIPITOR) 20 MG tablet, TAKE 1 TABLET BY MOUTH AT BEDTIME FOR CHOLESTEROL, Disp: 30 tablet, Rfl: 2 .  cholecalciferol (VITAMIN D) 1000 units tablet, Take 2,000 Units by mouth daily., Disp: , Rfl:  .  Esomeprazole Magnesium (NEXIUM PO), Take by mouth., Disp: , Rfl:  .  fluticasone (FLONASE) 50 MCG/ACT nasal spray, Place 2 sprays into both nostrils daily., Disp: , Rfl:  .  levothyroxine (SYNTHROID) 88 MCG tablet, TAKE 1 TABLET IN THE MORNING FOR THYROID, Disp: 30 tablet, Rfl: 2 .  mupirocin ointment (BACTROBAN) 2 %, Apply to affected area bid., Disp: 22 g, Rfl: 0  EXAM:  GENERAL: alert.  Sounds to be in no acute distress.  Answering questions appropriately.    PSYCH/NEURO: pleasant and cooperative, no obvious depression or anxiety, speech and thought processing grossly intact  ASSESSMENT AND PLAN:  Discussed the following  assessment and plan:  Aortic atherosclerosis (HCC) On lipitor.  Follow.    Environmental allergies Controlled.    Hypercholesterolemia On lipitor.  Low cholesterol diet and exercise.  Follow lipid panel and liver function tests.    Hypothyroidism On thyroid replacement.  Follow tsh.   Vertigo Vertigo as outlined.  Has had extensive w/up.  epley maneuvers.  Doing better.  Follow.    Skin abrasion Recent injury as outlined.  She was unable to send picture.  No surrounding erythema - per pt.  Hold on hydrogen peroxide.  bactroban as directed.  Leave open.  Follow. Notify me if persistent problems.      I  discussed the assessment and treatment plan with the patient. The patient was provided an opportunity to ask questions and all were answered. The patient agreed with the plan and demonstrated an understanding of the instructions.   The patient was advised to call back or seek an in-person evaluation if the symptoms worsen or if the condition fails to improve as anticipated.  I provided 24 minutes of non-face-to-face time during this encounter.   Einar Pheasant, MD

## 2019-04-22 NOTE — Telephone Encounter (Signed)
No photo attached. LMTCB

## 2019-04-25 DIAGNOSIS — T148XXA Other injury of unspecified body region, initial encounter: Secondary | ICD-10-CM | POA: Insufficient documentation

## 2019-04-25 MED ORDER — MUPIROCIN 2 % EX OINT: TOPICAL_OINTMENT | CUTANEOUS | 0 refills | Status: DC

## 2019-04-25 NOTE — Assessment & Plan Note (Signed)
On thyroid replacement.  Follow tsh.  

## 2019-04-25 NOTE — Assessment & Plan Note (Signed)
Vertigo as outlined.  Has had extensive w/up.  epley maneuvers.  Doing better.  Follow.

## 2019-04-25 NOTE — Assessment & Plan Note (Signed)
Controlled.  

## 2019-04-25 NOTE — Assessment & Plan Note (Signed)
On lipitor.  Follow.

## 2019-04-25 NOTE — Assessment & Plan Note (Signed)
On lipitor.  Low cholesterol diet and exercise.  Follow lipid panel and liver function tests.   

## 2019-04-25 NOTE — Assessment & Plan Note (Signed)
Recent injury as outlined.  She was unable to send picture.  No surrounding erythema - per pt.  Hold on hydrogen peroxide.  bactroban as directed.  Leave open.  Follow. Notify me if persistent problems.

## 2019-05-12 ENCOUNTER — Encounter: Payer: Self-pay | Admitting: Internal Medicine

## 2019-05-20 ENCOUNTER — Other Ambulatory Visit: Payer: Self-pay | Admitting: Internal Medicine

## 2019-06-03 ENCOUNTER — Other Ambulatory Visit: Payer: Medicare Other

## 2019-06-08 ENCOUNTER — Other Ambulatory Visit (INDEPENDENT_AMBULATORY_CARE_PROVIDER_SITE_OTHER): Payer: Medicare Other

## 2019-06-08 ENCOUNTER — Other Ambulatory Visit: Payer: Self-pay

## 2019-06-08 DIAGNOSIS — E039 Hypothyroidism, unspecified: Secondary | ICD-10-CM

## 2019-06-08 DIAGNOSIS — E7800 Pure hypercholesterolemia, unspecified: Secondary | ICD-10-CM

## 2019-06-08 LAB — HEPATIC FUNCTION PANEL
ALT: 16 U/L (ref 0–35)
AST: 18 U/L (ref 0–37)
Albumin: 4.4 g/dL (ref 3.5–5.2)
Alkaline Phosphatase: 59 U/L (ref 39–117)
Bilirubin, Direct: 0.1 mg/dL (ref 0.0–0.3)
Total Bilirubin: 0.5 mg/dL (ref 0.2–1.2)
Total Protein: 6.5 g/dL (ref 6.0–8.3)

## 2019-06-08 LAB — CBC WITH DIFFERENTIAL/PLATELET
Basophils Absolute: 0.1 10*3/uL (ref 0.0–0.1)
Basophils Relative: 0.6 % (ref 0.0–3.0)
Eosinophils Absolute: 0.1 10*3/uL (ref 0.0–0.7)
Eosinophils Relative: 1.4 % (ref 0.0–5.0)
HCT: 39.7 % (ref 36.0–46.0)
Hemoglobin: 13.3 g/dL (ref 12.0–15.0)
Lymphocytes Relative: 26.8 % (ref 12.0–46.0)
Lymphs Abs: 2.8 10*3/uL (ref 0.7–4.0)
MCHC: 33.6 g/dL (ref 30.0–36.0)
MCV: 99.4 fl (ref 78.0–100.0)
Monocytes Absolute: 0.7 10*3/uL (ref 0.1–1.0)
Monocytes Relative: 6.4 % (ref 3.0–12.0)
Neutro Abs: 6.7 10*3/uL (ref 1.4–7.7)
Neutrophils Relative %: 64.8 % (ref 43.0–77.0)
Platelets: 281 10*3/uL (ref 150.0–400.0)
RBC: 4 Mil/uL (ref 3.87–5.11)
RDW: 13.4 % (ref 11.5–15.5)
WBC: 10.4 10*3/uL (ref 4.0–10.5)

## 2019-06-08 LAB — BASIC METABOLIC PANEL
BUN: 10 mg/dL (ref 6–23)
CO2: 29 mEq/L (ref 19–32)
Calcium: 9.6 mg/dL (ref 8.4–10.5)
Chloride: 104 mEq/L (ref 96–112)
Creatinine, Ser: 0.66 mg/dL (ref 0.40–1.20)
GFR: 89.07 mL/min (ref >60.00)
Glucose, Bld: 84 mg/dL (ref 70–99)
Potassium: 4.8 mEq/L (ref 3.5–5.1)
Sodium: 140 mEq/L (ref 135–145)

## 2019-06-08 LAB — LIPID PANEL
Cholesterol: 186 mg/dL (ref 0–200)
HDL: 68.7 mg/dL (ref >39.00)
LDL Cholesterol: 96 mg/dL (ref 0–99)
NonHDL: 117.44
Total CHOL/HDL Ratio: 3
Triglycerides: 108 mg/dL (ref 0.0–149.0)
VLDL: 21.6 mg/dL (ref 0.0–40.0)

## 2019-06-08 LAB — TSH: TSH: 1.23 u[IU]/mL (ref 0.35–4.50)

## 2019-06-09 ENCOUNTER — Encounter: Payer: Self-pay | Admitting: Internal Medicine

## 2019-07-27 DIAGNOSIS — Z23 Encounter for immunization: Secondary | ICD-10-CM | POA: Diagnosis not present

## 2019-08-12 ENCOUNTER — Other Ambulatory Visit: Payer: Self-pay

## 2019-08-16 ENCOUNTER — Encounter: Payer: Self-pay | Admitting: Internal Medicine

## 2019-08-16 ENCOUNTER — Ambulatory Visit (INDEPENDENT_AMBULATORY_CARE_PROVIDER_SITE_OTHER): Payer: Medicare Other | Admitting: Internal Medicine

## 2019-08-16 ENCOUNTER — Other Ambulatory Visit: Payer: Self-pay

## 2019-08-16 VITALS — BP 136/72 | HR 70 | Temp 96.9°F | Resp 16 | Ht 60.0 in | Wt 117.0 lb

## 2019-08-16 DIAGNOSIS — E7800 Pure hypercholesterolemia, unspecified: Secondary | ICD-10-CM

## 2019-08-16 DIAGNOSIS — I700 Atherosclerosis of aorta: Secondary | ICD-10-CM

## 2019-08-16 DIAGNOSIS — Z23 Encounter for immunization: Secondary | ICD-10-CM | POA: Diagnosis not present

## 2019-08-16 DIAGNOSIS — F419 Anxiety disorder, unspecified: Secondary | ICD-10-CM | POA: Diagnosis not present

## 2019-08-16 DIAGNOSIS — E2839 Other primary ovarian failure: Secondary | ICD-10-CM | POA: Diagnosis not present

## 2019-08-16 DIAGNOSIS — F439 Reaction to severe stress, unspecified: Secondary | ICD-10-CM

## 2019-08-16 DIAGNOSIS — Z0000 Encounter for general adult medical examination without abnormal findings: Secondary | ICD-10-CM

## 2019-08-16 DIAGNOSIS — M79671 Pain in right foot: Secondary | ICD-10-CM | POA: Diagnosis not present

## 2019-08-16 DIAGNOSIS — S92213A Displaced fracture of cuboid bone of unspecified foot, initial encounter for closed fracture: Secondary | ICD-10-CM | POA: Diagnosis not present

## 2019-08-16 DIAGNOSIS — R42 Dizziness and giddiness: Secondary | ICD-10-CM | POA: Diagnosis not present

## 2019-08-16 DIAGNOSIS — E039 Hypothyroidism, unspecified: Secondary | ICD-10-CM

## 2019-08-16 NOTE — Assessment & Plan Note (Addendum)
Physical today 08/16/19.  Mammogram 12/24/18 - Birads I.  Discussed colonoscopy.  Need copy of outside report.  She will call back with name of GI physician she prefers to see.

## 2019-08-16 NOTE — Progress Notes (Signed)
Patient ID: Leah Benitez, female   DOB: 05/18/1951, 68 y.o.   MRN: [id number]   Subjective:    Patient ID: Leah Benitez, female    DOB: Mar 03, 1951, 68 y.o.   MRN: [id number]  HPI  Patient with past history of hypercholesterolemia and hypothyroidism who comes in today to follow up on these issues as well as for a complete physical exam.  She reports she is doing relatively well.  Stays active.  No chest pain.  No sob.  No acid reflux.  No abdominal pain.  Bowels moving.  Having problems with her right foot. Seeing podiatry today.  Discussed colon screening.  Need to obtain records. She will call back with which MD she prefers to see.  Discussed bone density and pneumovax.  No significant issues with vertigo now.  Had extensive w/up and evaluation.     Past Medical History:  Diagnosis Date  . Allergy   . Hyperlipidemia   . Thyroid disease    Past Surgical History:  Procedure Laterality Date  . ABDOMINAL HYSTERECTOMY     previous abnormal pap smear  . BREAST BIOPSY    . TONSILLECTOMY     Family History  Problem Relation Age of Onset  . Arthritis Mother   . Stroke Mother   . Hypertension Mother   . Arthritis Father   . Heart disease Father   . Heart disease Brother   . Cancer Maternal Aunt        breast   Social History   Socioeconomic History  . Marital status: Married    Spouse name: Not on file  . Number of children: Not on file  . Years of education: Not on file  . Highest education level: Not on file  Occupational History  . Not on file  Social Needs  . Financial resource strain: Not hard at all  . Food insecurity    Worry: Never true    Inability: Never true  . Transportation needs    Medical: No    Non-medical: No  Tobacco Use  . Smoking status: Current Every Day Smoker    Packs/day: 1.00    Years: 47.00    Pack years: 47.00    Types: Cigarettes  . Smokeless tobacco: Never Used  Substance and Sexual Activity  . Alcohol use: Yes    Alcohol/week: 7.0 standard  drinks    Types: 7 Glasses of wine per week    Comment: 1 glass of wine per night  . Drug use: Not on file  . Sexual activity: Not on file  Lifestyle  . Physical activity    Days per week: Not on file    Minutes per session: Not on file  . Stress: Not at all  Relationships  . Social Herbalist on phone: Not on file    Gets together: Not on file    Attends religious service: Not on file    Active member of club or organization: Not on file    Attends meetings of clubs or organizations: Not on file    Relationship status: Not on file  Other Topics Concern  . Not on file  Social History Narrative  . Not on file    Outpatient Encounter Medications as of 08/16/2019  Medication Sig  . atorvastatin (LIPITOR) 20 MG tablet TAKE 1 TABLET BY MOUTH AT BEDTIME FOR CHOLESTEROL  . cholecalciferol (VITAMIN D) 1000 units tablet Take 2,000 Units by mouth daily.  . Esomeprazole Magnesium (NEXIUM PO) Take  by mouth.  . fluticasone (FLONASE) 50 MCG/ACT nasal spray Place 2 sprays into both nostrils daily.  Marland Kitchen levothyroxine (SYNTHROID) 88 MCG tablet TAKE 1 TABLET IN THE MORNING FOR THYROID  . [DISCONTINUED] mupirocin ointment (BACTROBAN) 2 % Apply to affected area bid.   No facility-administered encounter medications on file as of 08/16/2019.    Review of Systems  Constitutional: Negative for appetite change and unexpected weight change.  HENT: Negative for congestion and sinus pressure.   Eyes: Negative for pain and visual disturbance.  Respiratory: Negative for cough, chest tightness and shortness of breath.   Cardiovascular: Negative for chest pain, palpitations and leg swelling.  Gastrointestinal: Negative for abdominal pain, diarrhea, nausea and vomiting.  Genitourinary: Negative for difficulty urinating and dysuria.  Musculoskeletal: Negative for joint swelling and myalgias.       Right foot pain.   Skin: Negative for color change and rash.  Neurological: Negative for dizziness,  light-headedness and headaches.  Hematological: Negative for adenopathy. Does not bruise/bleed easily.  Psychiatric/Behavioral: Negative for agitation and dysphoric mood.       Objective:    Physical Exam Constitutional:      General: She is not in acute distress.    Appearance: Normal appearance. She is well-developed.  HENT:     Head: Normocephalic and atraumatic.     Right Ear: External ear normal.     Left Ear: External ear normal.  Eyes:     General: No scleral icterus.       Right eye: No discharge.        Left eye: No discharge.     Conjunctiva/sclera: Conjunctivae normal.  Neck:     Musculoskeletal: Neck supple. No muscular tenderness.     Thyroid: No thyromegaly.  Cardiovascular:     Rate and Rhythm: Normal rate and regular rhythm.  Pulmonary:     Effort: No tachypnea, accessory muscle usage or respiratory distress.     Breath sounds: Normal breath sounds. No decreased breath sounds or wheezing.  Chest:     Breasts:        Right: No inverted nipple, mass, nipple discharge or tenderness (no axillary adenopathy).        Left: No inverted nipple, mass, nipple discharge or tenderness (no axilarry adenopathy).  Abdominal:     General: Bowel sounds are normal.     Palpations: Abdomen is soft.     Tenderness: There is no abdominal tenderness.  Musculoskeletal:        General: No swelling or tenderness.  Lymphadenopathy:     Cervical: No cervical adenopathy.  Skin:    Findings: No erythema or rash.  Neurological:     Mental Status: She is alert and oriented to person, place, and time.  Psychiatric:        Mood and Affect: Mood normal.        Behavior: Behavior normal.     BP 136/72   Pulse 70   Temp (!) 96.9 F (36.1 C)   Resp 16   Wt 117 lb (53.1 kg)   SpO2 97%   BMI 22.85 kg/m  Wt Readings from Last 3 Encounters:  08/16/19 117 lb (53.1 kg)  12/10/18 112 lb (50.8 kg)  12/10/18 112 lb 9.6 oz (51.1 kg)     Lab Results  Component Value Date   WBC  10.4 06/08/2019   HGB 13.3 06/08/2019   HCT 39.7 06/08/2019   PLT 281.0 06/08/2019   GLUCOSE 84 06/08/2019   CHOL 186 06/08/2019  TRIG 108.0 06/08/2019   HDL 68.70 06/08/2019   LDLCALC 96 06/08/2019   ALT 16 06/08/2019   AST 18 06/08/2019   NA 140 06/08/2019   K 4.8 06/08/2019   CL 104 06/08/2019   CREATININE 0.66 06/08/2019   BUN 10 06/08/2019   CO2 29 06/08/2019   TSH 1.23 06/08/2019    Ct Chest Lung Ca Screen Low Dose W/o Cm  Result Date: 12/13/2018 CLINICAL DATA:  68 year old female current smoker, with 47 pack-year history of smoking, for follow-up lung cancer screening EXAM: CT CHEST WITHOUT CONTRAST LOW-DOSE FOR LUNG CANCER SCREENING TECHNIQUE: Multidetector CT imaging of the chest was performed following the standard protocol without IV contrast. COMPARISON:  Low-dose lung cancer screening CT chest dated 11/13/2017 FINDINGS: Cardiovascular: The heart is normal in size. No pericardial effusion. No evidence of thoracic aortic aneurysm. Atherosclerotic calcifications of the aortic arch. Mild coronary atherosclerosis of the LAD. Mediastinum/Nodes: No suspicious mediastinal lymphadenopathy. Visualized thyroid is unremarkable. Lungs/Pleura: Biapical pleural-parenchymal scarring. Mild centrilobular emphysematous changes, upper lobe predominant. No focal consolidation. Scattered small bilateral pulmonary nodules, measuring up to 3.7 mm, unchanged. No pleural effusion or pneumothorax. Upper Abdomen: Visualized upper abdomen is grossly unremarkable. Musculoskeletal: Visualized osseous structures are within normal limits. IMPRESSION: Lung-RADS 2, benign appearance or behavior. Continue annual screening with low-dose chest CT without contrast in 12 months. Aortic Atherosclerosis (ICD10-I70.0) and Emphysema (ICD10-J43.9). Electronically Signed   By: Julian Hy M.D.   On: 12/13/2018 08:12       Assessment & Plan:   Problem List Items Addressed This Visit    Anxiety    Overall doing  well.  Follow.        Aortic atherosclerosis (HCC)    On lipitor.        Healthcare maintenance    Physical today 08/16/19.  Mammogram 12/24/18 - Birads I.  Discussed colonoscopy.  Need copy of outside report.  She will call back with name of GI physician she prefers to see.       Hypercholesterolemia    On lipitor.  Low cholesterol diet and exercise.  Follow lipid panel and liver function tests.        Relevant Orders   Hepatic function panel   Lipid panel   Basic metabolic panel (future)   Hypothyroidism    On thyroid replacement.  Follow tsh.        Stress    Discussed with her today.  Overall she feels she is handling things relatively well.  Follow.        Vertigo    Had extensive w/up and evaluation.  Stable.  Follow.        Other Visit Diagnoses    Estrogen deficiency    -  Primary   Relevant Orders   DG Bone Density   Need for 23-polyvalent pneumococcal polysaccharide vaccine       Relevant Orders   Pneumococcal polysaccharide vaccine 23-valent greater than or equal to 2yo subcutaneous/IM (Completed)       Einar Pheasant, MD

## 2019-08-19 ENCOUNTER — Other Ambulatory Visit: Payer: Self-pay | Admitting: Internal Medicine

## 2019-08-21 ENCOUNTER — Encounter: Payer: Self-pay | Admitting: Internal Medicine

## 2019-08-21 NOTE — Assessment & Plan Note (Signed)
On lipitor

## 2019-08-21 NOTE — Assessment & Plan Note (Signed)
On thyroid replacement.  Follow tsh.  

## 2019-08-21 NOTE — Assessment & Plan Note (Signed)
On lipitor.  Low cholesterol diet and exercise.  Follow lipid panel and liver function tests.   

## 2019-08-21 NOTE — Assessment & Plan Note (Signed)
Overall doing well.  Follow.   

## 2019-08-21 NOTE — Assessment & Plan Note (Signed)
Discussed with her today.  Overall she feels she is handling things relatively well.  Follow.   

## 2019-08-21 NOTE — Assessment & Plan Note (Signed)
Had extensive w/up and evaluation.  Stable.  Follow.

## 2019-08-22 ENCOUNTER — Encounter: Payer: Self-pay | Admitting: Internal Medicine

## 2019-09-06 DIAGNOSIS — M79671 Pain in right foot: Secondary | ICD-10-CM | POA: Diagnosis not present

## 2019-09-06 DIAGNOSIS — S92214D Nondisplaced fracture of cuboid bone of right foot, subsequent encounter for fracture with routine healing: Secondary | ICD-10-CM | POA: Diagnosis not present

## 2019-10-19 DIAGNOSIS — R05 Cough: Secondary | ICD-10-CM | POA: Diagnosis not present

## 2019-10-19 DIAGNOSIS — Z20828 Contact with and (suspected) exposure to other viral communicable diseases: Secondary | ICD-10-CM | POA: Diagnosis not present

## 2019-12-05 ENCOUNTER — Telehealth: Payer: Self-pay | Admitting: *Deleted

## 2019-12-05 NOTE — Telephone Encounter (Signed)
Contacted about scheduling lung screening scan. Patient reports she is caring for sick husband currently and requests to consider screening scan next month.

## 2019-12-16 ENCOUNTER — Other Ambulatory Visit: Payer: Self-pay

## 2019-12-16 ENCOUNTER — Other Ambulatory Visit (INDEPENDENT_AMBULATORY_CARE_PROVIDER_SITE_OTHER): Payer: Medicare Other

## 2019-12-16 DIAGNOSIS — E7800 Pure hypercholesterolemia, unspecified: Secondary | ICD-10-CM | POA: Diagnosis not present

## 2019-12-16 LAB — BASIC METABOLIC PANEL
BUN: 8 mg/dL (ref 6–23)
CO2: 27 mEq/L (ref 19–32)
Calcium: 9.6 mg/dL (ref 8.4–10.5)
Chloride: 105 mEq/L (ref 96–112)
Creatinine, Ser: 0.71 mg/dL (ref 0.40–1.20)
GFR: 81.74 mL/min (ref >60.00)
Glucose, Bld: 77 mg/dL (ref 70–99)
Potassium: 4.1 mEq/L (ref 3.5–5.1)
Sodium: 140 mEq/L (ref 135–145)

## 2019-12-16 LAB — LIPID PANEL
Cholesterol: 159 mg/dL (ref 0–200)
HDL: 62.8 mg/dL (ref >39.00)
LDL Cholesterol: 83 mg/dL (ref 0–99)
NonHDL: 96.61
Total CHOL/HDL Ratio: 3
Triglycerides: 68 mg/dL (ref 0.0–149.0)
VLDL: 13.6 mg/dL (ref 0.0–40.0)

## 2019-12-16 LAB — HEPATIC FUNCTION PANEL
ALT: 13 U/L (ref 0–35)
AST: 15 U/L (ref 0–37)
Albumin: 4 g/dL (ref 3.5–5.2)
Alkaline Phosphatase: 60 U/L (ref 39–117)
Bilirubin, Direct: 0.1 mg/dL (ref 0.0–0.3)
Total Bilirubin: 0.6 mg/dL (ref 0.2–1.2)
Total Protein: 6.3 g/dL (ref 6.0–8.3)

## 2019-12-20 ENCOUNTER — Other Ambulatory Visit: Payer: Medicare Other

## 2019-12-22 ENCOUNTER — Encounter: Payer: Self-pay | Admitting: Internal Medicine

## 2019-12-22 ENCOUNTER — Ambulatory Visit (INDEPENDENT_AMBULATORY_CARE_PROVIDER_SITE_OTHER): Payer: Medicare Other | Admitting: Internal Medicine

## 2019-12-22 ENCOUNTER — Other Ambulatory Visit: Payer: Self-pay

## 2019-12-22 DIAGNOSIS — G479 Sleep disorder, unspecified: Secondary | ICD-10-CM

## 2019-12-22 DIAGNOSIS — F419 Anxiety disorder, unspecified: Secondary | ICD-10-CM

## 2019-12-22 DIAGNOSIS — F439 Reaction to severe stress, unspecified: Secondary | ICD-10-CM | POA: Diagnosis not present

## 2019-12-22 DIAGNOSIS — I700 Atherosclerosis of aorta: Secondary | ICD-10-CM | POA: Diagnosis not present

## 2019-12-22 DIAGNOSIS — E039 Hypothyroidism, unspecified: Secondary | ICD-10-CM | POA: Diagnosis not present

## 2019-12-22 DIAGNOSIS — E7800 Pure hypercholesterolemia, unspecified: Secondary | ICD-10-CM | POA: Diagnosis not present

## 2019-12-22 DIAGNOSIS — I499 Cardiac arrhythmia, unspecified: Secondary | ICD-10-CM | POA: Diagnosis not present

## 2019-12-22 MED ORDER — SERTRALINE HCL 25 MG PO TABS: 25.0000 mg | ORAL_TABLET | Freq: Every day | ORAL | 1 refills | Status: DC

## 2019-12-22 NOTE — Progress Notes (Addendum)
Patient ID: Leah Benitez, female   DOB: 1951/04/22, 69 y.o.   MRN: [id number]   Subjective:    Patient ID: Leah Benitez, female    DOB: 1951-03-05, 69 y.o.   MRN: [id number]  HPI This visit occurred during the SARS-CoV-2 public health emergency.  Safety protocols were in place, including screening questions prior to the visit, additional usage of staff PPE, and extensive cleaning of exam room while observing appropriate contact time as indicated for disinfecting solutions.  Patient here for scheduled follow up.  Increased stress - caring for her sick husband.  Had pneumonia.  In hospital for 15 days.  On oxygen.  Strength gradually improving.  With the increased stress, she is not sleeping well.  Feels needs something to help level things out.  Discussed treatment.  No chest pain.  She is physically doing better.  Appetite improved.  No abdominal pain.  Bowels moving.     Past Medical History:  Diagnosis Date  . Allergy   . Hyperlipidemia   . Thyroid disease    Past Surgical History:  Procedure Laterality Date  . ABDOMINAL HYSTERECTOMY     previous abnormal pap smear  . BREAST BIOPSY    . TONSILLECTOMY     Family History  Problem Relation Age of Onset  . Arthritis Mother   . Stroke Mother   . Hypertension Mother   . Arthritis Father   . Heart disease Father   . Heart disease Brother   . Cancer Maternal Aunt        breast   Social History   Socioeconomic History  . Marital status: Married    Spouse name: Not on file  . Number of children: Not on file  . Years of education: Not on file  . Highest education level: Not on file  Occupational History  . Not on file  Tobacco Use  . Smoking status: Current Every Day Smoker    Packs/day: 1.00    Years: 47.00    Pack years: 47.00    Types: Cigarettes  . Smokeless tobacco: Never Used  Substance and Sexual Activity  . Alcohol use: Yes    Alcohol/week: 7.0 standard drinks    Types: 7 Glasses of wine per week    Comment: 1 glass  of wine per night  . Drug use: Not on file  . Sexual activity: Not on file  Other Topics Concern  . Not on file  Social History Narrative  . Not on file   Social Determinants of Health   Financial Resource Strain: Low Risk   . Difficulty of Paying Living Expenses: Not hard at all  Food Insecurity: No Food Insecurity  . Worried About Charity fundraiser in the Last Year: Never true  . Ran Out of Food in the Last Year: Never true  Transportation Needs: No Transportation Needs  . Lack of Transportation (Medical): No  . Lack of Transportation (Non-Medical): No  Physical Activity:   . Days of Exercise per Week:   . Minutes of Exercise per Session:   Stress: No Stress Concern Present  . Feeling of Stress : Not at all  Social Connections:   . Frequency of Communication with Friends and Family:   . Frequency of Social Gatherings with Friends and Family:   . Attends Religious Services:   . Active Member of Clubs or Organizations:   . Attends Archivist Meetings:   Marland Kitchen Marital Status:     Outpatient Encounter Medications as of  12/22/2019  Medication Sig  . atorvastatin (LIPITOR) 20 MG tablet TAKE 1 TABLET BY MOUTH AT BEDTIME FOR CHOLESTEROL  . cholecalciferol (VITAMIN D) 1000 units tablet Take 2,000 Units by mouth daily.  . Esomeprazole Magnesium (NEXIUM PO) Take by mouth.  . fluticasone (FLONASE) 50 MCG/ACT nasal spray Place 2 sprays into both nostrils daily.  Marland Kitchen levothyroxine (SYNTHROID) 88 MCG tablet TAKE 1 TABLET IN THE MORNING FOR THYROID  . sertraline (ZOLOFT) 25 MG tablet Take 1 tablet (25 mg total) by mouth daily.   No facility-administered encounter medications on file as of 12/22/2019.   Review of Systems  Constitutional: Negative for unexpected weight change.       Appetite has improved.    HENT: Negative for congestion and sinus pressure.   Respiratory: Negative for cough, chest tightness and shortness of breath.   Cardiovascular: Negative for chest pain,  palpitations and leg swelling.  Gastrointestinal: Negative for abdominal pain, diarrhea, nausea and vomiting.  Genitourinary: Negative for difficulty urinating and dysuria.  Musculoskeletal: Negative for joint swelling and myalgias.  Skin: Negative for color change and rash.  Neurological: Negative for dizziness, light-headedness and headaches.  Psychiatric/Behavioral: Positive for sleep disturbance. Negative for agitation and dysphoric mood.       Increased stress as outlined.         Objective:    Physical Exam Constitutional:      General: She is not in acute distress.    Appearance: Normal appearance.  HENT:     Head: Normocephalic and atraumatic.     Right Ear: External ear normal.     Left Ear: External ear normal.  Eyes:     General: No scleral icterus.       Right eye: No discharge.        Left eye: No discharge.     Conjunctiva/sclera: Conjunctivae normal.  Neck:     Thyroid: No thyromegaly.  Cardiovascular:     Rate and Rhythm: Normal rate.     Comments: Regular with what appears to be premature beats.  Pulmonary:     Effort: No respiratory distress.     Breath sounds: Normal breath sounds. No wheezing.  Abdominal:     General: Bowel sounds are normal.     Palpations: Abdomen is soft.     Tenderness: There is no abdominal tenderness.  Musculoskeletal:        General: No swelling or tenderness.     Cervical back: Neck supple. No tenderness.  Lymphadenopathy:     Cervical: No cervical adenopathy.  Skin:    Findings: No erythema or rash.  Neurological:     Mental Status: She is alert and oriented to person, place, and time.  Psychiatric:        Mood and Affect: Mood normal.        Behavior: Behavior normal.     BP 124/72   Pulse 71   Temp (!) 96 F (35.6 C)   Resp 16   Ht 5' (1.524 m)   Wt 112 lb (50.8 kg)   SpO2 99%   BMI 21.87 kg/m  Wt Readings from Last 3 Encounters:  12/22/19 112 lb (50.8 kg)  08/16/19 117 lb (53.1 kg)  12/10/18 112 lb  (50.8 kg)     Lab Results  Component Value Date   WBC 10.4 06/08/2019   HGB 13.3 06/08/2019   HCT 39.7 06/08/2019   PLT 281.0 06/08/2019   GLUCOSE 77 12/16/2019   CHOL 159 12/16/2019   TRIG  68.0 12/16/2019   HDL 62.80 12/16/2019   LDLCALC 83 12/16/2019   ALT 13 12/16/2019   AST 15 12/16/2019   NA 140 12/16/2019   K 4.1 12/16/2019   CL 105 12/16/2019   CREATININE 0.71 12/16/2019   BUN 8 12/16/2019   CO2 27 12/16/2019   TSH 1.23 06/08/2019    CT CHEST LUNG CA SCREEN LOW DOSE W/O CM  Result Date: 12/13/2018 CLINICAL DATA:  69 year old female current smoker, with 47 pack-year history of smoking, for follow-up lung cancer screening EXAM: CT CHEST WITHOUT CONTRAST LOW-DOSE FOR LUNG CANCER SCREENING TECHNIQUE: Multidetector CT imaging of the chest was performed following the standard protocol without IV contrast. COMPARISON:  Low-dose lung cancer screening CT chest dated 11/13/2017 FINDINGS: Cardiovascular: The heart is normal in size. No pericardial effusion. No evidence of thoracic aortic aneurysm. Atherosclerotic calcifications of the aortic arch. Mild coronary atherosclerosis of the LAD. Mediastinum/Nodes: No suspicious mediastinal lymphadenopathy. Visualized thyroid is unremarkable. Lungs/Pleura: Biapical pleural-parenchymal scarring. Mild centrilobular emphysematous changes, upper lobe predominant. No focal consolidation. Scattered small bilateral pulmonary nodules, measuring up to 3.7 mm, unchanged. No pleural effusion or pneumothorax. Upper Abdomen: Visualized upper abdomen is grossly unremarkable. Musculoskeletal: Visualized osseous structures are within normal limits. IMPRESSION: Lung-RADS 2, benign appearance or behavior. Continue annual screening with low-dose chest CT without contrast in 12 months. Aortic Atherosclerosis (ICD10-I70.0) and Emphysema (ICD10-J43.9). Electronically Signed   By: Julian Hy M.D.   On: 12/13/2018 08:12       Assessment & Plan:   Problem List  Items Addressed This Visit    Anxiety    Increased stress as outlined.  Discussed with her today.  Start zoloft as directed.  Follow closely.        Relevant Medications   sertraline (ZOLOFT) 25 MG tablet   Aortic atherosclerosis (HCC)    Continue lipitor.        Difficulty sleeping    Discussed increased stress, etc.  Discussed treatment options.  Start zoloft as outlined.  Follow.        Hypercholesterolemia    On lipitor.  Low cholesterol diet and exercise.  Follow lipid panel and liver function tests.        Hypothyroidism    On thyroid replacement.  Follow tsh.       Irregular heart rhythm    EKG - SR with PACs.  Discussed with her today.  Follow.        Relevant Orders   EKG 12-Lead   Stress    Increased stress as outlined.  Discussed with her today.  Discussed treatment.  Start zoloft as directed.  Follow.            Einar Pheasant, MD

## 2019-12-31 ENCOUNTER — Encounter: Payer: Self-pay | Admitting: Internal Medicine

## 2019-12-31 NOTE — Assessment & Plan Note (Signed)
Increased stress as outlined.  Discussed with her today.  Start zoloft as directed.  Follow closely.

## 2019-12-31 NOTE — Assessment & Plan Note (Signed)
On lipitor.  Low cholesterol diet and exercise.  Follow lipid panel and liver function tests.   

## 2019-12-31 NOTE — Assessment & Plan Note (Signed)
Discussed increased stress, etc.  Discussed treatment options.  Start zoloft as outlined.  Follow.

## 2019-12-31 NOTE — Assessment & Plan Note (Signed)
Continue lipitor  ?

## 2019-12-31 NOTE — Assessment & Plan Note (Signed)
EKG - SR with PACs.  Discussed with her today.  Follow.

## 2019-12-31 NOTE — Assessment & Plan Note (Signed)
Increased stress as outlined.  Discussed with her today.  Discussed treatment.  Start zoloft as directed.  Follow.

## 2019-12-31 NOTE — Assessment & Plan Note (Signed)
On thyroid replacement.  Follow tsh.  

## 2020-01-14 ENCOUNTER — Telehealth: Payer: Self-pay

## 2020-01-14 DIAGNOSIS — Z87891 Personal history of nicotine dependence: Secondary | ICD-10-CM

## 2020-01-14 NOTE — Telephone Encounter (Signed)
Message left notifying patient that it is time to schedule the low dose lung cancer screening CT scan.  Instructed patient to return call to Shawn Perkins at [phone] to verify information prior to CT scan being scheduled.    

## 2020-01-16 NOTE — Telephone Encounter (Signed)
Patient has been notified that annual lung cancer screening low dose CT scan is due currently or will be in near future. Confirmed that patient is within the age range of 55-77, and asymptomatic, (no signs or symptoms of lung cancer). Patient denies illness that would prevent curative treatment for lung cancer if found. Verified smoking history, (current, 47.5 pack year). The shared decision making visit was done 11/11/16. Patient is agreeable for CT scan being scheduled.

## 2020-01-16 NOTE — Addendum Note (Signed)
Addended by: Lieutenant Diego on: 01/16/2020 04:16 PM   Modules accepted: Orders

## 2020-01-20 ENCOUNTER — Other Ambulatory Visit: Payer: Self-pay | Admitting: Internal Medicine

## 2020-01-20 NOTE — Telephone Encounter (Signed)
Patient informed of low dose lung cancer screening CT scan appointment on 01/24/2020 @ 3:15.

## 2020-01-24 ENCOUNTER — Other Ambulatory Visit: Payer: Self-pay

## 2020-01-24 ENCOUNTER — Ambulatory Visit
Admission: RE | Admit: 2020-01-24 | Discharge: 2020-01-24 | Disposition: A | Discharge status: Home / Self Care | Payer: Medicare Other | Source: Ambulatory Visit | Attending: Oncology | Admitting: Oncology

## 2020-01-24 ENCOUNTER — Ambulatory Visit (INDEPENDENT_AMBULATORY_CARE_PROVIDER_SITE_OTHER): Payer: Medicare Other | Admitting: Internal Medicine

## 2020-01-24 DIAGNOSIS — E039 Hypothyroidism, unspecified: Secondary | ICD-10-CM | POA: Diagnosis not present

## 2020-01-24 DIAGNOSIS — F439 Reaction to severe stress, unspecified: Secondary | ICD-10-CM

## 2020-01-24 DIAGNOSIS — Z87891 Personal history of nicotine dependence: Secondary | ICD-10-CM | POA: Diagnosis not present

## 2020-01-24 DIAGNOSIS — I700 Atherosclerosis of aorta: Secondary | ICD-10-CM | POA: Diagnosis not present

## 2020-01-24 DIAGNOSIS — Z9109 Other allergy status, other than to drugs and biological substances: Secondary | ICD-10-CM

## 2020-01-24 DIAGNOSIS — E7800 Pure hypercholesterolemia, unspecified: Secondary | ICD-10-CM

## 2020-01-24 DIAGNOSIS — F419 Anxiety disorder, unspecified: Secondary | ICD-10-CM

## 2020-01-24 DIAGNOSIS — F17210 Nicotine dependence, cigarettes, uncomplicated: Secondary | ICD-10-CM | POA: Diagnosis not present

## 2020-01-24 MED ORDER — SERTRALINE HCL 25 MG PO TABS: ORAL_TABLET | ORAL | 2 refills | Status: DC

## 2020-01-24 NOTE — Patient Instructions (Signed)
Saline nasal spray - flush nose at least 2x/day  flonase nasal spray - 2 sprays each nostril one time per day.  Do this in the evening.    mucinex daily.

## 2020-01-24 NOTE — Progress Notes (Signed)
Patient ID: Leah Benitez, female   DOB: 1951/02/28, 69 y.o.   MRN: [id number]   Subjective:    Patient ID: Leah Benitez, female    DOB: 1951/09/27, 69 y.o.   MRN: [id number]  HPI This visit occurred during the SARS-CoV-2 public health emergency.  Safety protocols were in place, including screening questions prior to the visit, additional usage of staff PPE, and extensive cleaning of exam room while observing appropriate contact time as indicated for disinfecting solutions.  Patient here for a scheduled follow up.  She reports she is doing relatively well.  Still with increased stress - related to her husband's health issues.  Last visit, started her on zoloft.  She does feel this is helping.  No chest pain or increased sob reported. Does report increased sinus congestion at times.  Takes zyrtec and mucinex.  Helps.  Left ear fullness - two days.  Some drainage.  No fever.  No sore throat.  No abdominal pain.  Bowels moving.    Past Medical History:  Diagnosis Date  . Allergy   . Hyperlipidemia   . Thyroid disease    Past Surgical History:  Procedure Laterality Date  . ABDOMINAL HYSTERECTOMY     previous abnormal pap smear  . BREAST BIOPSY    . TONSILLECTOMY     Family History  Problem Relation Age of Onset  . Arthritis Mother   . Stroke Mother   . Hypertension Mother   . Arthritis Father   . Heart disease Father   . Heart disease Brother   . Cancer Maternal Aunt        breast   Social History   Socioeconomic History  . Marital status: Married    Spouse name: Not on file  . Number of children: Not on file  . Years of education: Not on file  . Highest education level: Not on file  Occupational History  . Not on file  Tobacco Use  . Smoking status: Current Every Day Smoker    Packs/day: 1.00    Years: 47.00    Pack years: 47.00    Types: Cigarettes  . Smokeless tobacco: Never Used  Substance and Sexual Activity  . Alcohol use: Yes    Alcohol/week: 7.0 standard drinks   Types: 7 Glasses of wine per week    Comment: 1 glass of wine per night  . Drug use: Not on file  . Sexual activity: Not on file  Other Topics Concern  . Not on file  Social History Narrative  . Not on file   Social Determinants of Health   Financial Resource Strain: Low Risk   . Difficulty of Paying Living Expenses: Not hard at all  Food Insecurity: No Food Insecurity  . Worried About Charity fundraiser in the Last Year: Never true  . Ran Out of Food in the Last Year: Never true  Transportation Needs: No Transportation Needs  . Lack of Transportation (Medical): No  . Lack of Transportation (Non-Medical): No  Physical Activity:   . Days of Exercise per Week:   . Minutes of Exercise per Session:   Stress: No Stress Concern Present  . Feeling of Stress : Not at all  Social Connections:   . Frequency of Communication with Friends and Family:   . Frequency of Social Gatherings with Friends and Family:   . Attends Religious Services:   . Active Member of Clubs or Organizations:   . Attends Archivist Meetings:   .  Marital Status:     Outpatient Encounter Medications as of 01/24/2020  Medication Sig  . atorvastatin (LIPITOR) 20 MG tablet TAKE 1 TABLET BY MOUTH AT BEDTIME FOR CHOLESTEROL  . cholecalciferol (VITAMIN D) 1000 units tablet Take 2,000 Units by mouth daily.  . Esomeprazole Magnesium (NEXIUM PO) Take by mouth.  . fluticasone (FLONASE) 50 MCG/ACT nasal spray Place 2 sprays into both nostrils daily.  Marland Kitchen levothyroxine (SYNTHROID) 88 MCG tablet TAKE 1 TABLET IN THE MORNING FOR THYROID  . sertraline (ZOLOFT) 25 MG tablet 1-2 tablets q day  . [DISCONTINUED] sertraline (ZOLOFT) 25 MG tablet TAKE 1 TABLET BY MOUTH ONCE DAILY.   No facility-administered encounter medications on file as of 01/24/2020.    Review of Systems  Constitutional: Negative for appetite change and fever.  HENT: Positive for congestion, postnasal drip and sinus pressure.   Respiratory: Negative  for cough, chest tightness and shortness of breath.   Cardiovascular: Negative for chest pain, palpitations and leg swelling.  Gastrointestinal: Negative for abdominal pain, diarrhea, nausea and vomiting.  Genitourinary: Negative for difficulty urinating and dysuria.  Musculoskeletal: Negative for joint swelling and myalgias.  Skin: Negative for color change and rash.  Neurological: Negative for dizziness, light-headedness and headaches.  Psychiatric/Behavioral: Negative for agitation and dysphoric mood.       Objective:    Physical Exam Constitutional:      General: She is not in acute distress.    Appearance: Normal appearance.  HENT:     Head: Normocephalic and atraumatic.     Right Ear: Tympanic membrane and external ear normal. There is no impacted cerumen.     Left Ear: Tympanic membrane and external ear normal. There is no impacted cerumen.  Eyes:     General: No scleral icterus.       Right eye: No discharge.        Left eye: No discharge.     Conjunctiva/sclera: Conjunctivae normal.  Neck:     Thyroid: No thyromegaly.  Cardiovascular:     Rate and Rhythm: Normal rate and regular rhythm.  Pulmonary:     Effort: No respiratory distress.     Breath sounds: Normal breath sounds. No wheezing.  Abdominal:     General: Bowel sounds are normal.     Palpations: Abdomen is soft.     Tenderness: There is no abdominal tenderness.  Musculoskeletal:        General: No swelling or tenderness.     Cervical back: Neck supple. No tenderness.  Lymphadenopathy:     Cervical: No cervical adenopathy.  Skin:    Findings: No erythema or rash.  Neurological:     Mental Status: She is alert.  Psychiatric:        Mood and Affect: Mood normal.        Behavior: Behavior normal.     BP 140/72   Pulse 74   Temp (!) 97.4 F (36.3 C)   Resp 16   Ht 5' (1.524 m)   Wt 114 lb 3.2 oz (51.8 kg)   SpO2 98%   BMI 22.30 kg/m  Wt Readings from Last 3 Encounters:  01/24/20 112 lb (50.8  kg)  01/24/20 114 lb 3.2 oz (51.8 kg)  12/22/19 112 lb (50.8 kg)     Lab Results  Component Value Date   WBC 10.4 06/08/2019   HGB 13.3 06/08/2019   HCT 39.7 06/08/2019   PLT 281.0 06/08/2019   GLUCOSE 77 12/16/2019   CHOL 159 12/16/2019  TRIG 68.0 12/16/2019   HDL 62.80 12/16/2019   LDLCALC 83 12/16/2019   ALT 13 12/16/2019   AST 15 12/16/2019   NA 140 12/16/2019   K 4.1 12/16/2019   CL 105 12/16/2019   CREATININE 0.71 12/16/2019   BUN 8 12/16/2019   CO2 27 12/16/2019   TSH 1.23 06/08/2019       Assessment & Plan:   Problem List Items Addressed This Visit    Anxiety    Increased stress.  On zoloft.  Doing better.  Follow.        Relevant Medications   sertraline (ZOLOFT) 25 MG tablet   Aortic atherosclerosis ([facility])    On lipitor.       Environmental allergies    Symptoms as outlined.  Takes zyrtec and mucinex.  With increased sinus pressure, will hold zyrtic.  Saline nasal spray and steroid nasal spray as directed.  Continue mucinex.  Follow.  Notify me if symptoms worsen or do not resolve.        Hypercholesterolemia    On lipitor.  Low cholesterol diet and exercise.  Follow lipid panel and liver function tests.        Hypothyroidism    On thyroid replacement.  Follow tsh.       Stress    Increased stress as outlined.  Discussed with her today.  She is doing better.  On zoloft.  Continue.  Follow.            Einar Pheasant, MD

## 2020-01-26 ENCOUNTER — Encounter: Payer: Self-pay | Admitting: *Deleted

## 2020-01-26 DIAGNOSIS — H1132 Conjunctival hemorrhage, left eye: Secondary | ICD-10-CM | POA: Diagnosis not present

## 2020-01-29 ENCOUNTER — Encounter: Payer: Self-pay | Admitting: Internal Medicine

## 2020-01-29 NOTE — Assessment & Plan Note (Signed)
On thyroid replacement.  Follow tsh.  

## 2020-01-29 NOTE — Assessment & Plan Note (Signed)
On lipitor.  Low cholesterol diet and exercise.  Follow lipid panel and liver function tests.   

## 2020-01-29 NOTE — Assessment & Plan Note (Signed)
Increased stress.  On zoloft.  Doing better.  Follow.

## 2020-01-29 NOTE — Assessment & Plan Note (Addendum)
Symptoms as outlined.  Takes zyrtec and mucinex.  With increased sinus pressure, will hold zyrtic.  Saline nasal spray and steroid nasal spray as directed.  Continue mucinex.  Follow.  Notify me if symptoms worsen or do not resolve.

## 2020-01-29 NOTE — Assessment & Plan Note (Signed)
Increased stress as outlined.  Discussed with her today.  She is doing better.  On zoloft.  Continue.  Follow.

## 2020-01-29 NOTE — Assessment & Plan Note (Signed)
On lipitor

## 2020-01-30 ENCOUNTER — Other Ambulatory Visit: Payer: Self-pay | Admitting: Internal Medicine

## 2020-01-30 DIAGNOSIS — N289 Disorder of kidney and ureter, unspecified: Secondary | ICD-10-CM

## 2020-01-30 DIAGNOSIS — R9389 Abnormal findings on diagnostic imaging of other specified body structures: Secondary | ICD-10-CM

## 2020-01-30 NOTE — Progress Notes (Signed)
Order placed for renal ultrasound.  Kidney lesion noted on chest CT. Discussed with pt.  Agree to f/u renal ultrasound.

## 2020-02-01 ENCOUNTER — Ambulatory Visit
Admission: RE | Admit: 2020-02-01 | Discharge: 2020-02-01 | Disposition: A | Discharge status: Home / Self Care | Payer: Medicare Other | Source: Ambulatory Visit | Attending: Internal Medicine | Admitting: Internal Medicine

## 2020-02-01 ENCOUNTER — Other Ambulatory Visit: Payer: Self-pay

## 2020-02-01 DIAGNOSIS — R9389 Abnormal findings on diagnostic imaging of other specified body structures: Secondary | ICD-10-CM | POA: Diagnosis not present

## 2020-02-01 DIAGNOSIS — N289 Disorder of kidney and ureter, unspecified: Secondary | ICD-10-CM | POA: Insufficient documentation

## 2020-02-01 DIAGNOSIS — N2889 Other specified disorders of kidney and ureter: Secondary | ICD-10-CM | POA: Diagnosis not present

## 2020-02-03 ENCOUNTER — Ambulatory Visit: Payer: Medicare Other

## 2020-02-03 DIAGNOSIS — Z23 Encounter for immunization: Secondary | ICD-10-CM | POA: Diagnosis not present

## 2020-02-07 ENCOUNTER — Other Ambulatory Visit: Payer: Self-pay | Admitting: Internal Medicine

## 2020-02-07 DIAGNOSIS — N281 Cyst of kidney, acquired: Secondary | ICD-10-CM

## 2020-02-07 NOTE — Progress Notes (Signed)
Order placed for urology referral.  

## 2020-02-11 DIAGNOSIS — G44009 Cluster headache syndrome, unspecified, not intractable: Secondary | ICD-10-CM | POA: Insufficient documentation

## 2020-02-20 DIAGNOSIS — Z1231 Encounter for screening mammogram for malignant neoplasm of breast: Secondary | ICD-10-CM | POA: Diagnosis not present

## 2020-02-20 LAB — HM MAMMOGRAPHY

## 2020-02-23 ENCOUNTER — Encounter: Payer: Self-pay | Admitting: Internal Medicine

## 2020-02-23 NOTE — Progress Notes (Signed)
02/24/20 12:57 PM   Leah Benitez Apr 06, 1951 [id number]  Referring provider: Einar Pheasant, [id number] [place],  [place] [id number] Chief Complaint  Patient presents with  . RENAL CYST    HPI: Leah Benitez is a 69 y.o. F who presents today for the evaluation and management of a renal cyst.   She reports of having an annual Chest CT where she was informed about a potential renal cyst.   Her f/u RUS on 02/01/20 revealed small bilateral renal masses. Larger on the right, medial midpole, larger on the left, also midpole, are consistent with cysts. Small mass noted from the upper pole the right kidney corresponding to the CT lesion is consistent with a mildly complicated cyst. Smaller lesion from the lower pole the left kidney most likely a cyst, but not fully characterized.  Denies flank pain or gross hematuria.   She is a smoker.   PMH: Past Medical History:  Diagnosis Date  . Allergy   . Hyperlipidemia   . Thyroid disease     Surgical History: Past Surgical History:  Procedure Laterality Date  . ABDOMINAL HYSTERECTOMY     previous abnormal pap smear  . BREAST BIOPSY    . TONSILLECTOMY      Home Medications:  Allergies as of 02/24/2020      Reactions   Actonel [risedronate Sodium] Nausea And Vomiting   Penicillins Swelling      Medication List       Accurate as of Feb 24, 2020 11:59 PM. If you have any questions, ask your nurse or doctor.        atorvastatin 20 MG tablet Commonly known as: LIPITOR TAKE 1 TABLET BY MOUTH AT BEDTIME FOR CHOLESTEROL   cholecalciferol 1000 units tablet Commonly known as: VITAMIN D Take 2,000 Units by mouth daily.   fluticasone 50 MCG/ACT nasal spray Commonly known as: FLONASE Place 2 sprays into both nostrils daily.   levothyroxine 88 MCG tablet Commonly known as: SYNTHROID TAKE 1 TABLET IN THE MORNING FOR THYROID   NEXIUM PO Take by mouth.   sertraline 25 MG tablet Commonly known as:  ZOLOFT 1-2 tablets q day       Allergies:  Allergies  Allergen Reactions  . Actonel [Risedronate Sodium] Nausea And Vomiting  . Penicillins Swelling    Family History: Family History  Problem Relation Age of Onset  . Arthritis Mother   . Stroke Mother   . Hypertension Mother   . Arthritis Father   . Heart disease Father   . Heart disease Brother   . Cancer Maternal Aunt        breast    Social History:  reports that she has been smoking cigarettes. She has a 47.00 pack-year smoking history. She has never used smokeless tobacco. She reports current alcohol use of about 7.0 standard drinks of alcohol per week. No history on file for drug.   Physical Exam: BP 125/74   Pulse 76   Ht 5' (1.524 m)   Wt 115 lb (52.2 kg)   BMI 22.46 kg/m   Constitutional:  Alert and oriented, No acute distress. HEENT: [place] AT, moist mucus membranes.  Trachea midline, no masses. Cardiovascular: No clubbing, cyanosis, or edema. Respiratory: Normal respiratory effort, no increased work of breathing. Skin: No rashes, bruises or suspicious lesions. Neurologic: Grossly intact, no focal deficits, moving all 4 extremities. Psychiatric: Normal mood and affect.  Laboratory Data:  Lab Results  Component Value Date   CREATININE 0.71  12/16/2019   Pertinent Imaging: Results for orders placed during the hospital encounter of 02/01/20  US Renal   Narrative CLINICAL DATA:  Follow-up for lesion noted on lung cancer screening chest CT.  EXAM: RENAL / URINARY TRACT ULTRASOUND COMPLETE  COMPARISON:  Chest CTs, 01/24/2020 and 12/10/2018.  FINDINGS: Right Kidney:  Renal measurements: 8.6 x 3.5 x 4.9 cm = volume: 77 mL. 10 x 7 x 5 mm upper pole right renal mass, exophytic, hypoechoic consistent with a cyst but not fully characterized. However, based on its hyperattenuation on CT, and stability from 12/02/2018, this is most likely a cyst complicated by previous hemorrhage or proteinaceous content,  Bosniak category 2. Medial midpole cyst measuring 1.2 x 0.9 x 0.8 cm. No other renal masses, no stones and no hydronephrosis.  Left Kidney:  Renal measurements: 10.2 x 4.2 x 4.8 cm = volume: 107 mL. Small midpole cyst, 10 x 8 x 11 mm. Smaller hypoechoic anechoic mass from the lower pole, 8 x 6 x 7 mm, not fully characterize, but also most likely a cyst. No other left renal masses, no stones and no hydronephrosis.  Bladder:  Appears normal for degree of bladder distention.  Other:  None.  IMPRESSION: 1. Small bilateral renal masses as detailed. Larger on the right, medial midpole, larger on the left, also midpole, are consistent with cysts. Small mass noted from the upper pole the right kidney corresponding to the CT lesion is consistent with a mildly complicated cyst. Smaller lesion from the lower pole the left kidney most likely a cyst, but not fully characterized. Consider follow-up imaging to document stability, repeat ultrasound 6-12 months, versus further characterization renal MRI without and with contrast.   Electronically Signed   By: Lajean Manes M.D.   On: 02/01/2020 16:56    I have personally reviewed the images and agree with radiologist interpretation.   Assessment & Plan:    1. Renal cyst  RUS on 02/01/20 revealed small bilateral renal masses consistent w/ previous CT scan  A solid renal mass raises the suspicion of primary renal malignancy.  We discussed this in detail and in regards to the spectrum of renal masses which includes cysts (pure cysts are considered benign), solid masses and everything in between. The risk of metastasis increases as the size of solid renal mass increases. In general, it is believed that the risk of metastasis for renal masses less than 3-4 cm is small (up to approximately 5%) based mainly on large retrospective studies. For cystic renal masses, we reviewed the Bosniak classification and discussed that Bosniak 3 lesions harbor a  50% chance of malignancy whereas Bosniak 4 cysts have a solid and 90-95% are malignant in nature.  Discussed active surveillance including options of MRI vs RUS q 6 months w/ associated risks and benefits  F/u in 6 months for [facility] [address] [place], [place] [address] [phone]  I, Lucas Mallow, am acting as a scribe for Dr. Hollice Espy,  I have reviewed the above documentation for accuracy and completeness, and I agree with the above.   Hollice Espy, MD

## 2020-02-24 ENCOUNTER — Other Ambulatory Visit: Payer: Self-pay

## 2020-02-24 ENCOUNTER — Encounter: Payer: Self-pay | Admitting: Urology

## 2020-02-24 ENCOUNTER — Ambulatory Visit (INDEPENDENT_AMBULATORY_CARE_PROVIDER_SITE_OTHER): Payer: Medicare Other | Admitting: Urology

## 2020-02-24 VITALS — BP 125/74 | HR 76 | Ht 60.0 in | Wt 115.0 lb

## 2020-02-24 DIAGNOSIS — N281 Cyst of kidney, acquired: Secondary | ICD-10-CM | POA: Diagnosis not present

## 2020-03-01 ENCOUNTER — Telehealth: Payer: Self-pay | Admitting: Internal Medicine

## 2020-03-01 NOTE — Telephone Encounter (Signed)
Left message for patient to call back and schedule Medicare Annual Wellness Visit (AWV) either virtually or audio only.  Last AWV 02/02/19; please schedule at anytime with Denisa O'Brien-Blaney at [facility].

## 2020-03-02 DIAGNOSIS — Z23 Encounter for immunization: Secondary | ICD-10-CM | POA: Diagnosis not present

## 2020-03-22 DIAGNOSIS — H524 Presbyopia: Secondary | ICD-10-CM | POA: Diagnosis not present

## 2020-03-22 DIAGNOSIS — H2513 Age-related nuclear cataract, bilateral: Secondary | ICD-10-CM | POA: Diagnosis not present

## 2020-04-05 ENCOUNTER — Other Ambulatory Visit: Payer: Self-pay

## 2020-04-05 ENCOUNTER — Ambulatory Visit (INDEPENDENT_AMBULATORY_CARE_PROVIDER_SITE_OTHER): Payer: Medicare Other | Admitting: Internal Medicine

## 2020-04-05 DIAGNOSIS — F439 Reaction to severe stress, unspecified: Secondary | ICD-10-CM | POA: Diagnosis not present

## 2020-04-05 DIAGNOSIS — E7800 Pure hypercholesterolemia, unspecified: Secondary | ICD-10-CM

## 2020-04-05 DIAGNOSIS — E039 Hypothyroidism, unspecified: Secondary | ICD-10-CM | POA: Diagnosis not present

## 2020-04-05 DIAGNOSIS — I700 Atherosclerosis of aorta: Secondary | ICD-10-CM

## 2020-04-05 DIAGNOSIS — N281 Cyst of kidney, acquired: Secondary | ICD-10-CM

## 2020-04-05 DIAGNOSIS — Z9109 Other allergy status, other than to drugs and biological substances: Secondary | ICD-10-CM

## 2020-04-05 MED ORDER — DOXYCYCLINE HYCLATE 100 MG PO TABS: 100.0000 mg | ORAL_TABLET | Freq: Two times a day (BID) | ORAL | 0 refills | Status: DC

## 2020-04-05 NOTE — Patient Instructions (Signed)
Examples of probiotics:  Align, culturelle or florastor.    Take a probiotic daily while you are on the antibiotics and for two weeks after completing the antibiotics.

## 2020-04-05 NOTE — Progress Notes (Signed)
Patient ID: Leah Benitez, female   DOB: Mikaiah 29, 1952, 70 y.o.   MRN: [id number]   Subjective:    Patient ID: Leah Benitez, female    DOB: November 07, 1950, 70 y.o.   MRN: [id number]  HPI This visit occurred during the SARS-CoV-2 public health emergency.  Safety protocols were in place, including screening questions prior to the visit, additional usage of staff PPE, and extensive cleaning of exam room while observing appropriate contact time as indicated for disinfecting solutions.  Patient here for a scheduled follow up.  Seeing urology for f/u renal cyst.  Evaluated 02/24/20.  Elected to monitor with f/u ultrasound in 6 months.  Handling stress relatively well.  Husband is doing better.  On zoloft.  Feels this dose is working for her.  Desires no further intervention at this time.  Tries to stay active. No chest pain or sob.  No acid reflux or abdominal pain reported.  She does report persistent congestion.  Has now developed a sore throat and "knots" - neck.  Also with cough - productive of white/yellow mucus.  Symptoms have worsened over the last two weeks.  Has taken zyrtec, mucinex.  Using saline and flonase.  Despite this, symptoms have worsened over the last couple of weeks.  No chest tightness or sob.  No nausea or vomiting reported.  No documented fever.   Past Medical History:  Diagnosis Date  . Allergy   . Hyperlipidemia   . Thyroid disease    Past Surgical History:  Procedure Laterality Date  . ABDOMINAL HYSTERECTOMY     previous abnormal pap smear  . BREAST BIOPSY    . TONSILLECTOMY     Family History  Problem Relation Age of Onset  . Arthritis Mother   . Stroke Mother   . Hypertension Mother   . Arthritis Father   . Heart disease Father   . Heart disease Brother   . Cancer Maternal Aunt        breast   Social History   Socioeconomic History  . Marital status: Married    Spouse name: Not on file  . Number of children: Not on file  . Years of education: Not on file  . Highest  education level: Not on file  Occupational History  . Not on file  Tobacco Use  . Smoking status: Current Every Day Smoker    Packs/day: 1.00    Years: 47.00    Pack years: 47.00    Types: Cigarettes  . Smokeless tobacco: Never Used  Substance and Sexual Activity  . Alcohol use: Yes    Alcohol/week: 7.0 standard drinks    Types: 7 Glasses of wine per week    Comment: 1 glass of wine per night  . Drug use: Not on file  . Sexual activity: Not on file  Other Topics Concern  . Not on file  Social History Narrative  . Not on file   Social Determinants of Health   Financial Resource Strain:   . Difficulty of Paying Living Expenses:   Food Insecurity:   . Worried About Charity fundraiser in the Last Year:   . Arboriculturist in the Last Year:   Transportation Needs:   . Film/video editor (Medical):   Marland Kitchen Lack of Transportation (Non-Medical):   Physical Activity:   . Days of Exercise per Week:   . Minutes of Exercise per Session:   Stress:   . Feeling of Stress :   Social Connections:   .  Frequency of Communication with Friends and Family:   . Frequency of Social Gatherings with Friends and Family:   . Attends Religious Services:   . Active Member of Clubs or Organizations:   . Attends Archivist Meetings:   Marland Kitchen Marital Status:     Outpatient Encounter Medications as of 04/05/2020  Medication Sig  . atorvastatin (LIPITOR) 20 MG tablet TAKE 1 TABLET BY MOUTH AT BEDTIME FOR CHOLESTEROL  . cholecalciferol (VITAMIN D) 1000 units tablet Take 2,000 Units by mouth daily.  . Esomeprazole Magnesium (NEXIUM PO) Take by mouth.  . fluticasone (FLONASE) 50 MCG/ACT nasal spray Place 2 sprays into both nostrils daily.  Marland Kitchen levothyroxine (SYNTHROID) 88 MCG tablet TAKE 1 TABLET IN THE MORNING FOR THYROID  . sertraline (ZOLOFT) 25 MG tablet 1-2 tablets q day  . doxycycline (VIBRA-TABS) 100 MG tablet Take 1 tablet (100 mg total) by mouth 2 (two) times daily.   No  facility-administered encounter medications on file as of 04/05/2020.    Review of Systems  Constitutional: Negative for appetite change, fever and unexpected weight change.  HENT: Positive for congestion, postnasal drip, sinus pressure and sore throat.   Respiratory: Positive for cough. Negative for chest tightness and shortness of breath.   Cardiovascular: Negative for chest pain, palpitations and leg swelling.  Gastrointestinal: Negative for abdominal pain, diarrhea, nausea and vomiting.  Genitourinary: Negative for difficulty urinating and dysuria.  Musculoskeletal: Negative for joint swelling and myalgias.  Skin: Negative for color change and rash.  Neurological: Negative for dizziness, light-headedness and headaches.  Psychiatric/Behavioral: Negative for agitation and dysphoric mood.       Objective:    Physical Exam Vitals reviewed.  Constitutional:      General: She is not in acute distress.    Appearance: Normal appearance.  HENT:     Head: Normocephalic and atraumatic.     Right Ear: External ear normal.     Left Ear: External ear normal.  Eyes:     General: No scleral icterus.       Right eye: No discharge.        Left eye: No discharge.     Conjunctiva/sclera: Conjunctivae normal.  Neck:     Thyroid: No thyromegaly.  Cardiovascular:     Rate and Rhythm: Normal rate and regular rhythm.  Pulmonary:     Effort: No respiratory distress.     Breath sounds: Normal breath sounds. No wheezing.  Abdominal:     General: Bowel sounds are normal.     Palpations: Abdomen is soft.     Tenderness: There is no abdominal tenderness.  Musculoskeletal:        General: No swelling or tenderness.     Cervical back: Neck supple. No tenderness.  Lymphadenopathy:     Cervical: No cervical adenopathy.  Skin:    Findings: No erythema or rash.  Neurological:     Mental Status: She is alert.  Psychiatric:        Mood and Affect: Mood normal.        Behavior: Behavior normal.      BP 132/72   Pulse 86   Temp (!) 96.6 F (35.9 C)   Resp 16   Ht 5' (1.524 m)   Wt 115 lb (52.2 kg)   SpO2 97%   BMI 22.46 kg/m  Wt Readings from Last 3 Encounters:  04/05/20 115 lb (52.2 kg)  02/24/20 115 lb (52.2 kg)  01/24/20 112 lb (50.8 kg)  Lab Results  Component Value Date   WBC 10.4 06/08/2019   HGB 13.3 06/08/2019   HCT 39.7 06/08/2019   PLT 281.0 06/08/2019   GLUCOSE 77 12/16/2019   CHOL 159 12/16/2019   TRIG 68.0 12/16/2019   HDL 62.80 12/16/2019   LDLCALC 83 12/16/2019   ALT 13 12/16/2019   AST 15 12/16/2019   NA 140 12/16/2019   K 4.1 12/16/2019   CL 105 12/16/2019   CREATININE 0.71 12/16/2019   BUN 8 12/16/2019   CO2 27 12/16/2019   TSH 1.23 06/08/2019    US Renal  Result Date: 02/01/2020 CLINICAL DATA:  Follow-up for lesion noted on lung cancer screening chest CT. EXAM: RENAL / URINARY TRACT ULTRASOUND COMPLETE COMPARISON:  Chest CTs, 01/24/2020 and 12/10/2018. FINDINGS: Right Kidney: Renal measurements: 8.6 x 3.5 x 4.9 cm = volume: 77 mL. 10 x 7 x 5 mm upper pole right renal mass, exophytic, hypoechoic consistent with a cyst but not fully characterized. However, based on its hyperattenuation on CT, and stability from 12/02/2018, this is most likely a cyst complicated by previous hemorrhage or proteinaceous content, Bosniak category 2. Medial midpole cyst measuring 1.2 x 0.9 x 0.8 cm. No other renal masses, no stones and no hydronephrosis. Left Kidney: Renal measurements: 10.2 x 4.2 x 4.8 cm = volume: 107 mL. Small midpole cyst, 10 x 8 x 11 mm. Smaller hypoechoic anechoic mass from the lower pole, 8 x 6 x 7 mm, not fully characterize, but also most likely a cyst. No other left renal masses, no stones and no hydronephrosis. Bladder: Appears normal for degree of bladder distention. Other: None. IMPRESSION: 1. Small bilateral renal masses as detailed. Larger on the right, medial midpole, larger on the left, also midpole, are consistent with cysts. Small  mass noted from the upper pole the right kidney corresponding to the CT lesion is consistent with a mildly complicated cyst. Smaller lesion from the lower pole the left kidney most likely a cyst, but not fully characterized. Consider follow-up imaging to document stability, repeat ultrasound 6-12 months, versus further characterization renal MRI without and with contrast. Electronically Signed   By: Lajean Manes M.D.   On: 02/01/2020 16:56       Assessment & Plan:   Problem List Items Addressed This Visit    Aortic atherosclerosis ([place])    On lipitor.        Environmental allergies    Was being treated for allergies.  Now with increased sinus congestion, sore throat, cough and congestion - productive.  Continue saline nasal spray and flonase.  Continue mucinex.  Doxycycline as directed.  Probiotic as directed.  Advised of increased sun sensitivity with doxycycline.  Call with update.        Hypercholesterolemia    On lipitor.  Low cholesterol diet and exercise.  Follow lipid panel and liver function tests.        Relevant Orders   CBC with Differential/Platelet   Hepatic function panel   Lipid panel   Basic metabolic panel   Hypothyroidism    On thyroid replacement.  Follow tsh.        Relevant Orders   TSH   Renal cyst    Renal cyst being followed by urology.  Last evaluated 02/2020,  Recommended f/u ultrasound in 6 months.        Stress    Husband is doing better.  On zoloft.  Handling stress.  Does not feel needs any further intervention.  Follow.  Nahal Wanless, MD 

## 2020-04-08 ENCOUNTER — Encounter: Payer: Self-pay | Admitting: Internal Medicine

## 2020-04-08 DIAGNOSIS — N281 Cyst of kidney, acquired: Secondary | ICD-10-CM | POA: Insufficient documentation

## 2020-04-08 NOTE — Assessment & Plan Note (Signed)
Renal cyst being followed by urology.  Last evaluated 02/2020,  Recommended f/u ultrasound in 6 months.

## 2020-04-08 NOTE — Assessment & Plan Note (Signed)
On thyroid replacement.  Follow tsh.  

## 2020-04-08 NOTE — Assessment & Plan Note (Signed)
Husband is doing better.  On zoloft.  Handling stress.  Does not feel needs any further intervention.  Follow.

## 2020-04-08 NOTE — Assessment & Plan Note (Signed)
Was being treated for allergies.  Now with increased sinus congestion, sore throat, cough and congestion - productive.  Continue saline nasal spray and flonase.  Continue mucinex.  Doxycycline as directed.  Probiotic as directed.  Advised of increased sun sensitivity with doxycycline.  Call with update.

## 2020-04-08 NOTE — Assessment & Plan Note (Signed)
On lipitor

## 2020-04-08 NOTE — Assessment & Plan Note (Signed)
On lipitor.  Low cholesterol diet and exercise.  Follow lipid panel and liver function tests.   

## 2020-04-19 ENCOUNTER — Other Ambulatory Visit: Payer: Self-pay | Admitting: Internal Medicine

## 2020-04-26 ENCOUNTER — Other Ambulatory Visit: Payer: Self-pay

## 2020-04-26 ENCOUNTER — Other Ambulatory Visit (INDEPENDENT_AMBULATORY_CARE_PROVIDER_SITE_OTHER): Payer: Medicare Other

## 2020-04-26 DIAGNOSIS — E7800 Pure hypercholesterolemia, unspecified: Secondary | ICD-10-CM

## 2020-04-26 DIAGNOSIS — E039 Hypothyroidism, unspecified: Secondary | ICD-10-CM

## 2020-04-26 LAB — HEPATIC FUNCTION PANEL
ALT: 18 U/L (ref 0–35)
AST: 17 U/L (ref 0–37)
Albumin: 4.5 g/dL (ref 3.5–5.2)
Alkaline Phosphatase: 74 U/L (ref 39–117)
Bilirubin, Direct: 0.1 mg/dL (ref 0.0–0.3)
Total Bilirubin: 0.5 mg/dL (ref 0.2–1.2)
Total Protein: 6.5 g/dL (ref 6.0–8.3)

## 2020-04-26 LAB — BASIC METABOLIC PANEL
BUN: 9 mg/dL (ref 6–23)
CO2: 30 mEq/L (ref 19–32)
Calcium: 9.6 mg/dL (ref 8.4–10.5)
Chloride: 102 mEq/L (ref 96–112)
Creatinine, Ser: 0.73 mg/dL (ref 0.40–1.20)
GFR: 79.08 mL/min (ref >60.00)
Glucose, Bld: 88 mg/dL (ref 70–99)
Potassium: 4.1 mEq/L (ref 3.5–5.1)
Sodium: 140 mEq/L (ref 135–145)

## 2020-04-26 LAB — CBC WITH DIFFERENTIAL/PLATELET
Basophils Absolute: 0.1 10*3/uL (ref 0.0–0.1)
Basophils Relative: 0.9 % (ref 0.0–3.0)
Eosinophils Absolute: 0.2 10*3/uL (ref 0.0–0.7)
Eosinophils Relative: 1.5 % (ref 0.0–5.0)
HCT: 40.7 % (ref 36.0–46.0)
Hemoglobin: 13.7 g/dL (ref 12.0–15.0)
Lymphocytes Relative: 31 % (ref 12.0–46.0)
Lymphs Abs: 3.1 10*3/uL (ref 0.7–4.0)
MCHC: 33.7 g/dL (ref 30.0–36.0)
MCV: 97.9 fl (ref 78.0–100.0)
Monocytes Absolute: 0.7 10*3/uL (ref 0.1–1.0)
Monocytes Relative: 7 % (ref 3.0–12.0)
Neutro Abs: 5.9 10*3/uL (ref 1.4–7.7)
Neutrophils Relative %: 59.6 % (ref 43.0–77.0)
Platelets: 266 10*3/uL (ref 150.0–400.0)
RBC: 4.15 Mil/uL (ref 3.87–5.11)
RDW: 13.5 % (ref 11.5–15.5)
WBC: 9.9 10*3/uL (ref 4.0–10.5)

## 2020-04-26 LAB — LIPID PANEL
Cholesterol: 180 mg/dL (ref 0–200)
HDL: 75.2 mg/dL (ref >39.00)
LDL Cholesterol: 84 mg/dL (ref 0–99)
NonHDL: 105
Total CHOL/HDL Ratio: 2
Triglycerides: 105 mg/dL (ref 0.0–149.0)
VLDL: 21 mg/dL (ref 0.0–40.0)

## 2020-04-26 LAB — TSH: TSH: 2.3 u[IU]/mL (ref 0.35–4.50)

## 2020-05-17 ENCOUNTER — Other Ambulatory Visit: Payer: Self-pay | Admitting: Internal Medicine

## 2020-05-22 DIAGNOSIS — J0100 Acute maxillary sinusitis, unspecified: Secondary | ICD-10-CM | POA: Diagnosis not present

## 2020-05-22 DIAGNOSIS — R519 Headache, unspecified: Secondary | ICD-10-CM | POA: Diagnosis not present

## 2020-05-22 DIAGNOSIS — M26609 Unspecified temporomandibular joint disorder, unspecified side: Secondary | ICD-10-CM | POA: Diagnosis not present

## 2020-05-23 DIAGNOSIS — J329 Chronic sinusitis, unspecified: Secondary | ICD-10-CM | POA: Diagnosis not present

## 2020-06-20 ENCOUNTER — Other Ambulatory Visit: Payer: Self-pay | Admitting: Internal Medicine

## 2020-07-06 ENCOUNTER — Other Ambulatory Visit: Payer: Self-pay

## 2020-07-06 ENCOUNTER — Encounter: Payer: Self-pay | Admitting: Internal Medicine

## 2020-07-06 ENCOUNTER — Ambulatory Visit (INDEPENDENT_AMBULATORY_CARE_PROVIDER_SITE_OTHER): Payer: Medicare Other | Admitting: Internal Medicine

## 2020-07-06 DIAGNOSIS — I700 Atherosclerosis of aorta: Secondary | ICD-10-CM | POA: Diagnosis not present

## 2020-07-06 DIAGNOSIS — E7800 Pure hypercholesterolemia, unspecified: Secondary | ICD-10-CM | POA: Diagnosis not present

## 2020-07-06 DIAGNOSIS — M26629 Arthralgia of temporomandibular joint, unspecified side: Secondary | ICD-10-CM | POA: Diagnosis not present

## 2020-07-06 DIAGNOSIS — F439 Reaction to severe stress, unspecified: Secondary | ICD-10-CM | POA: Diagnosis not present

## 2020-07-06 DIAGNOSIS — F419 Anxiety disorder, unspecified: Secondary | ICD-10-CM

## 2020-07-06 DIAGNOSIS — E039 Hypothyroidism, unspecified: Secondary | ICD-10-CM

## 2020-07-06 NOTE — Progress Notes (Signed)
Patient ID: Leah Benitez, female   DOB: [birth year], 69 y.o.   MRN: [id number]   Subjective:    Patient ID: Leah Benitez, female    DOB: Dec 24, 1950, 69 y.o.   MRN: [id number]  HPI This visit occurred during the SARS-CoV-2 public health emergency.  Safety protocols were in place, including screening questions prior to the visit, additional usage of staff PPE, and extensive cleaning of exam room while observing appropriate contact time as indicated for disinfecting solutions.  Patient here for a scheduled follow up.  She has been seeing her dentist - TMJ.  Has seen ophthalmology and ENT.  Has had robaxin and two rounds of prednisone.  Some better with prednisone.  Using warm compresses.  Helps.  Sleeping with night guard.  Plans to f/u with her dentist.  Is some better.  No chest pain or sob reported.  No abdominal pain or bowel change reported.  Handling stress.  Does not feel needs any further intervention.    Past Medical History:  Diagnosis Date  . Allergy   . Hyperlipidemia   . Thyroid disease    Past Surgical History:  Procedure Laterality Date  . ABDOMINAL HYSTERECTOMY     previous abnormal pap smear  . BREAST BIOPSY    . TONSILLECTOMY     Family History  Problem Relation Age of Onset  . Arthritis Mother   . Stroke Mother   . Hypertension Mother   . Arthritis Father   . Heart disease Father   . Heart disease Brother   . Cancer Maternal Aunt        breast   Social History   Socioeconomic History  . Marital status: Married    Spouse name: Not on file  . Number of children: Not on file  . Years of education: Not on file  . Highest education level: Not on file  Occupational History  . Not on file  Tobacco Use  . Smoking status: Current Every Day Smoker    Packs/day: 1.00    Years: 47.00    Pack years: 47.00    Types: Cigarettes  . Smokeless tobacco: Never Used  Substance and Sexual Activity  . Alcohol use: Yes    Alcohol/week: 7.0 standard drinks    Types: 7 Glasses of  wine per week    Comment: 1 glass of wine per night  . Drug use: Not on file  . Sexual activity: Not on file  Other Topics Concern  . Not on file  Social History Narrative  . Not on file   Social Determinants of Health   Financial Resource Strain:   . Difficulty of Paying Living Expenses: Not on file  Food Insecurity:   . Worried About Charity fundraiser in the Last Year: Not on file  . Ran Out of Food in the Last Year: Not on file  Transportation Needs:   . Lack of Transportation (Medical): Not on file  . Lack of Transportation (Non-Medical): Not on file  Physical Activity:   . Days of Exercise per Week: Not on file  . Minutes of Exercise per Session: Not on file  Stress:   . Feeling of Stress : Not on file  Social Connections:   . Frequency of Communication with Friends and Family: Not on file  . Frequency of Social Gatherings with Friends and Family: Not on file  . Attends Religious Services: Not on file  . Active Member of Clubs or Organizations: Not on file  . Attends  Club or Organization Meetings: Not on file  . Marital Status: Not on file    Outpatient Encounter Medications as of 07/06/2020  Medication Sig  . atorvastatin (LIPITOR) 20 MG tablet TAKE 1 TABLET BY MOUTH AT BEDTIME FOR CHOLESTEROL  . cholecalciferol (VITAMIN D) 1000 units tablet Take 2,000 Units by mouth daily.  Marland Kitchen doxycycline (VIBRA-TABS) 100 MG tablet Take 1 tablet (100 mg total) by mouth 2 (two) times daily.  . Esomeprazole Magnesium (NEXIUM PO) Take by mouth.  . fluticasone (FLONASE) 50 MCG/ACT nasal spray Place 2 sprays into both nostrils daily.  Marland Kitchen levothyroxine (SYNTHROID) 88 MCG tablet TAKE 1 TABLET IN THE MORNING FOR THYROID  . methocarbamol (ROBAXIN) 750 MG tablet Take 1-2 tablets by mouth 4 (four) times daily.  . sertraline (ZOLOFT) 25 MG tablet TAKE (1) OR (2) TABLETS BY MOUTH DAILY.   No facility-administered encounter medications on file as of 07/06/2020.    Review of Systems    Constitutional: Negative for fever and unexpected weight change.  HENT: Negative for congestion and sinus pressure.        TMJ issues as outlined.    Respiratory: Negative for cough, chest tightness and shortness of breath.   Cardiovascular: Negative for chest pain, palpitations and leg swelling.  Gastrointestinal: Negative for abdominal pain, diarrhea, nausea and vomiting.  Genitourinary: Negative for difficulty urinating and dysuria.  Musculoskeletal: Negative for joint swelling and myalgias.  Skin: Negative for color change and rash.  Neurological: Negative for dizziness, light-headedness and headaches.  Psychiatric/Behavioral: Negative for agitation and dysphoric mood.       Objective:    Physical Exam Vitals reviewed.  Constitutional:      General: She is not in acute distress.    Appearance: Normal appearance.  HENT:     Head: Normocephalic and atraumatic.     Right Ear: External ear normal.     Left Ear: External ear normal.  Eyes:     General: No scleral icterus.       Right eye: No discharge.        Left eye: No discharge.     Conjunctiva/sclera: Conjunctivae normal.  Neck:     Thyroid: No thyromegaly.  Cardiovascular:     Rate and Rhythm: Normal rate and regular rhythm.  Pulmonary:     Effort: No respiratory distress.     Breath sounds: Normal breath sounds. No wheezing.  Abdominal:     General: Bowel sounds are normal.     Palpations: Abdomen is soft.     Tenderness: There is no abdominal tenderness.  Musculoskeletal:        General: No swelling or tenderness.     Cervical back: Neck supple. No tenderness.  Lymphadenopathy:     Cervical: No cervical adenopathy.  Skin:    Findings: No erythema or rash.  Neurological:     Mental Status: She is alert.  Psychiatric:        Mood and Affect: Mood normal.        Behavior: Behavior normal.     BP 109/62   Pulse 92   Temp 98.3 F (36.8 C) (Oral)   Resp 16   Ht 5' (1.524 m)   Wt 113 lb 12.8 oz (51.6  kg)   SpO2 97%   BMI 22.23 kg/m  Wt Readings from Last 3 Encounters:  07/06/20 113 lb 12.8 oz (51.6 kg)  04/05/20 115 lb (52.2 kg)  02/24/20 115 lb (52.2 kg)     Lab Results  Component  Value Date   WBC 9.9 04/26/2020   HGB 13.7 04/26/2020   HCT 40.7 04/26/2020   PLT 266.0 04/26/2020   GLUCOSE 88 04/26/2020   CHOL 180 04/26/2020   TRIG 105.0 04/26/2020   HDL 75.20 04/26/2020   LDLCALC 84 04/26/2020   ALT 18 04/26/2020   AST 17 04/26/2020   NA 140 04/26/2020   K 4.1 04/26/2020   CL 102 04/26/2020   CREATININE 0.73 04/26/2020   BUN 9 04/26/2020   CO2 30 04/26/2020   TSH 2.30 04/26/2020    US Renal  Result Date: 02/01/2020 CLINICAL DATA:  Follow-up for lesion noted on lung cancer screening chest CT. EXAM: RENAL / URINARY TRACT ULTRASOUND COMPLETE COMPARISON:  Chest CTs, 01/24/2020 and 12/10/2018. FINDINGS: Right Kidney: Renal measurements: 8.6 x 3.5 x 4.9 cm = volume: 77 mL. 10 x 7 x 5 mm upper pole right renal mass, exophytic, hypoechoic consistent with a cyst but not fully characterized. However, based on its hyperattenuation on CT, and stability from 12/02/2018, this is most likely a cyst complicated by previous hemorrhage or proteinaceous content, Bosniak category 2. Medial midpole cyst measuring 1.2 x 0.9 x 0.8 cm. No other renal masses, no stones and no hydronephrosis. Left Kidney: Renal measurements: 10.2 x 4.2 x 4.8 cm = volume: 107 mL. Small midpole cyst, 10 x 8 x 11 mm. Smaller hypoechoic anechoic mass from the lower pole, 8 x 6 x 7 mm, not fully characterize, but also most likely a cyst. No other left renal masses, no stones and no hydronephrosis. Bladder: Appears normal for degree of bladder distention. Other: None. IMPRESSION: 1. Small bilateral renal masses as detailed. Larger on the right, medial midpole, larger on the left, also midpole, are consistent with cysts. Small mass noted from the upper pole the right kidney corresponding to the CT lesion is consistent with a  mildly complicated cyst. Smaller lesion from the lower pole the left kidney most likely a cyst, but not fully characterized. Consider follow-up imaging to document stability, repeat ultrasound 6-12 months, versus further characterization renal MRI without and with contrast. Electronically Signed   By: Lajean Manes M.D.   On: 02/01/2020 16:56       Assessment & Plan:   Problem List Items Addressed This Visit    TMJ arthralgia    Is seeing her dentist.  Has seen ophthalmology and ENT.  Has had prednisone and muscle relaxer.  Wearing a night guard. Applying heat.  Some better.  Continue f/u with her dentist.        Stress    On zoloft.  Handling stress.  Consider increasing to 50mg  q day.  Follow.        Hypothyroidism    On thyroid replacement.  Follow tsh.        Hypercholesterolemia    On lipitor.  Low cholesterol diet and exercise.  Follow lipid panel and liver function tests.        Relevant Orders   Hepatic function panel   Lipid panel   Basic metabolic panel   Aortic atherosclerosis (HCC)    On lipitor.        Anxiety    On zoloft.  Overall handling stress.  Follow.            Einar Pheasant, MD

## 2020-07-11 ENCOUNTER — Encounter: Payer: Self-pay | Admitting: Internal Medicine

## 2020-07-15 ENCOUNTER — Encounter: Payer: Self-pay | Admitting: Internal Medicine

## 2020-07-15 DIAGNOSIS — M26629 Arthralgia of temporomandibular joint, unspecified side: Secondary | ICD-10-CM | POA: Insufficient documentation

## 2020-07-15 NOTE — Assessment & Plan Note (Signed)
On zoloft.  Overall handling stress.  Follow.

## 2020-07-15 NOTE — Assessment & Plan Note (Signed)
On thyroid replacement.  Follow tsh.  

## 2020-07-15 NOTE — Assessment & Plan Note (Signed)
On lipitor.  Low cholesterol diet and exercise.  Follow lipid panel and liver function tests.   

## 2020-07-15 NOTE — Assessment & Plan Note (Signed)
Is seeing her dentist.  Has seen ophthalmology and ENT.  Has had prednisone and muscle relaxer.  Wearing a night guard. Applying heat.  Some better.  Continue f/u with her dentist.

## 2020-07-15 NOTE — Assessment & Plan Note (Signed)
On zoloft.  Handling stress.  Consider increasing to 50mg  q day.  Follow.

## 2020-07-15 NOTE — Assessment & Plan Note (Signed)
On lipitor

## 2020-07-23 DIAGNOSIS — Z5181 Encounter for therapeutic drug level monitoring: Secondary | ICD-10-CM | POA: Diagnosis not present

## 2020-07-23 DIAGNOSIS — E538 Deficiency of other specified B group vitamins: Secondary | ICD-10-CM | POA: Diagnosis not present

## 2020-07-23 DIAGNOSIS — E559 Vitamin D deficiency, unspecified: Secondary | ICD-10-CM | POA: Diagnosis not present

## 2020-07-23 DIAGNOSIS — G44011 Episodic cluster headache, intractable: Secondary | ICD-10-CM | POA: Diagnosis not present

## 2020-07-24 DIAGNOSIS — Z23 Encounter for immunization: Secondary | ICD-10-CM | POA: Diagnosis not present

## 2020-07-30 ENCOUNTER — Encounter: Payer: Self-pay | Admitting: Internal Medicine

## 2020-08-01 DIAGNOSIS — E538 Deficiency of other specified B group vitamins: Secondary | ICD-10-CM | POA: Diagnosis not present

## 2020-08-06 DIAGNOSIS — G44011 Episodic cluster headache, intractable: Secondary | ICD-10-CM | POA: Insufficient documentation

## 2020-08-09 DIAGNOSIS — G44011 Episodic cluster headache, intractable: Secondary | ICD-10-CM | POA: Diagnosis not present

## 2020-08-10 DIAGNOSIS — E538 Deficiency of other specified B group vitamins: Secondary | ICD-10-CM | POA: Diagnosis not present

## 2020-08-17 ENCOUNTER — Ambulatory Visit
Admission: RE | Admit: 2020-08-17 | Discharge: 2020-08-17 | Disposition: A | Discharge status: Home / Self Care | Payer: Medicare Other | Source: Ambulatory Visit | Attending: Urology | Admitting: Urology

## 2020-08-17 ENCOUNTER — Other Ambulatory Visit: Payer: Self-pay

## 2020-08-17 DIAGNOSIS — N281 Cyst of kidney, acquired: Secondary | ICD-10-CM | POA: Insufficient documentation

## 2020-08-17 DIAGNOSIS — E538 Deficiency of other specified B group vitamins: Secondary | ICD-10-CM | POA: Diagnosis not present

## 2020-08-24 ENCOUNTER — Other Ambulatory Visit
Admission: RE | Admit: 2020-08-24 | Discharge: 2020-08-24 | Disposition: A | Discharge status: Home / Self Care | Payer: Medicare Other | Attending: Urology | Admitting: Urology

## 2020-08-24 ENCOUNTER — Other Ambulatory Visit: Payer: Self-pay

## 2020-08-24 ENCOUNTER — Ambulatory Visit (INDEPENDENT_AMBULATORY_CARE_PROVIDER_SITE_OTHER): Payer: Medicare Other | Admitting: Urology

## 2020-08-24 ENCOUNTER — Encounter: Payer: Self-pay | Admitting: Urology

## 2020-08-24 VITALS — BP 151/71 | HR 87 | Ht 60.0 in | Wt 114.0 lb

## 2020-08-24 DIAGNOSIS — N3281 Overactive bladder: Secondary | ICD-10-CM

## 2020-08-24 DIAGNOSIS — N281 Cyst of kidney, acquired: Secondary | ICD-10-CM

## 2020-08-24 DIAGNOSIS — E538 Deficiency of other specified B group vitamins: Secondary | ICD-10-CM | POA: Diagnosis not present

## 2020-08-24 LAB — URINALYSIS, COMPLETE (UACMP) WITH MICROSCOPIC
Bilirubin Urine: NEGATIVE
Glucose, UA: NEGATIVE mg/dL
Ketones, ur: NEGATIVE mg/dL
Nitrite: NEGATIVE
Protein, ur: NEGATIVE mg/dL
Specific Gravity, Urine: <1.005 — ABNORMAL LOW (ref 1.005–1.030)
Squamous Epithelial / HPF: NONE SEEN (ref 0–5)
pH: 6 (ref 5.0–8.0)

## 2020-08-24 NOTE — Progress Notes (Signed)
08/24/2020 3:01 PM   Arwyn Mcdonald 1951-08-08 [id number]  Referring provider: Einar Pheasant, [address] [place],  [place] [address]  Chief Complaint  Patient presents with  . Follow-up    75mo w/Renal u/s    HPI: 69 year old female who returns today to the office for surveillance of renal cyst.  She reports of having an annual Chest CT where she was informed about a potential renal cyst.   Her f/u [facility] on 02/01/20 revealed small bilateral renal masses. Larger on the right, medial midpole, larger on the left, also midpole, are consistent with cysts. Small mass noted from the upper pole the right kidney corresponding to the CT lesion is consistent with a mildly complicated cyst. Smaller lesion from the lower pole the left kidney most likely a cyst, but not fully characterized.  She elected to follow-up this lesion with serial ultrasound.  She returns with a renal ultrasound showing a stable mildly complex renal cyst of the right upper pole, likely Bosniak 2 without interval growth.  This complex cyst measures 0.9 x 0.8 x 1.80 cm.  She also mentions today that she has been having increasing urinary symptoms including frequency urgency.  When the urge with her, she is to get to the bathroom on time where she'll have an accident.  Accidents are few and far between.  She doesn't wear any incontinence pads.  She does drink a cup of coffee in the morning as well as tea during the day.  Minimal stress urinary incontinence.   PMH: Past Medical History:  Diagnosis Date  . Allergy   . Hyperlipidemia   . Thyroid disease     Surgical History: Past Surgical History:  Procedure Laterality Date  . ABDOMINAL HYSTERECTOMY     previous abnormal pap smear  . BREAST BIOPSY    . TONSILLECTOMY      Home Medications:  Allergies as of 08/24/2020      Reactions   Actonel [risedronate Sodium] Nausea And Vomiting   Penicillins Swelling      Medication List        Accurate as of August 24, 2020  3:01 PM. If you have any questions, ask your nurse or doctor.        STOP taking these medications   doxycycline 100 MG tablet Commonly known as: VIBRA-TABS Stopped by: Hollice Espy, MD   methocarbamol 750 MG tablet Commonly known as: ROBAXIN Stopped by: Hollice Espy, MD     TAKE these medications   atorvastatin 20 MG tablet Commonly known as: LIPITOR TAKE 1 TABLET BY MOUTH AT BEDTIME FOR CHOLESTEROL   cholecalciferol 1000 units tablet Commonly known as: VITAMIN D Take 2,000 Units by mouth daily.   fluticasone 50 MCG/ACT nasal spray Commonly known as: FLONASE Place 2 sprays into both nostrils daily.   levothyroxine 88 MCG tablet Commonly known as: SYNTHROID TAKE 1 TABLET IN THE MORNING FOR THYROID   NEXIUM PO Take by mouth.   sertraline 25 MG tablet Commonly known as: ZOLOFT TAKE (1) OR (2) TABLETS BY MOUTH DAILY.   verapamil 120 MG CR tablet Commonly known as: CALAN-SR Take 120 mg by mouth daily.   Vitamin B12 1000 MCG Tbcr       Allergies:  Allergies  Allergen Reactions  . Actonel [Risedronate Sodium] Nausea And Vomiting  . Penicillins Swelling    Family History: Family History  Problem Relation Age of Onset  . Arthritis Mother   . Stroke Mother   . Hypertension Mother   .  Arthritis Father   . Heart disease Father   . Heart disease Brother   . Cancer Maternal Aunt        breast    Social History:  reports that she has been smoking cigarettes. She has a 47.00 pack-year smoking history. She has never used smokeless tobacco. She reports current alcohol use of about 7.0 standard drinks of alcohol per week. No history on file for drug use.   Physical Exam: BP (!) 151/71   Pulse 87   Ht 5' (1.524 m)   Wt 114 lb (51.7 kg)   BMI 22.26 kg/m   Constitutional:  Alert and oriented, No acute distress. HEENT: [place] AT, moist mucus membranes.  Trachea midline, no masses. Cardiovascular: No clubbing, cyanosis, or  edema. Respiratory: Normal respiratory effort, no increased work of breathing. Skin: No rashes, bruises or suspicious lesions. Neurologic: Grossly intact, no focal deficits, moving all 4 extremities. Psychiatric: Normal mood and affect.  Laboratory Data: Lab Results  Component Value Date   WBC 9.9 04/26/2020   HGB 13.7 04/26/2020   HCT 40.7 04/26/2020   MCV 97.9 04/26/2020   PLT 266.0 04/26/2020    Lab Results  Component Value Date   CREATININE 0.73 04/26/2020   Component     Latest Ref Rng & Units 08/24/2020  Color, Urine     YELLOW STRAW (A)  Appearance     CLEAR CLEAR  Specific Gravity, Urine     1.005 - 1.030 <1.005 (L)  pH     5.0 - 8.0 6.0  Glucose, UA     NEGATIVE mg/dL NEGATIVE  Hgb urine dipstick     NEGATIVE TRACE (A)  Bilirubin Urine     NEGATIVE NEGATIVE  Ketones, ur     NEGATIVE mg/dL NEGATIVE  Protein     NEGATIVE mg/dL NEGATIVE  Nitrite     NEGATIVE NEGATIVE  Leukocytes,Ua     NEGATIVE SMALL (A)  Squamous Epithelial / LPF     0 - 5 NONE SEEN  WBC, UA     0 - 5 WBC/hpf 6-10  RBC / HPF     0 - 5 RBC/hpf 0-5  Bacteria, UA     NONE SEEN FEW (A)     Pertinent Imaging: Narrative CLINICAL DATA:  Renal cysts  EXAM: RENAL / URINARY TRACT ULTRASOUND COMPLETE  COMPARISON:  February 01, 2020  FINDINGS: Right Kidney:  Renal measurements 8.4 x 4.1 x 5.3 cm = volume: 95.5 mL. Echogenicity and renal cortical thickness are within normal limits. No perinephric fluid or hydronephrosis visualized. There is a mildly complex cyst in the upper pole right kidney region measuring 0.9 x 0.8 x 1.0 cm. A cyst noted in the mid to lower right kidney measuring 0.7 x 0.8 x 0.9 cm. No sonographically demonstrable calculus or ureterectasis.  Left Kidney:  Renal measurements: 9.8 x 4.4 x 5.3 cm = volume: 120 mL. Echogenicity and renal cortical thickness are within normal limits. No perinephric fluid or hydronephrosis visualized. There is a cyst in the lower  pole left kidney measuring 0.9 x 0.7 x 1.2 cm. No sonographically demonstrable calculus or ureterectasis.  Bladder:  Appears normal for degree of bladder distention.  Other:  None.  IMPRESSION: Small cysts on each side with a mildly complex cyst in the upper pole the right kidney, felt to represent a Bosniak II cystic area. No new renal mass is evident. No hydronephrosis on either side.  Right kidney is rather small which potentially could indicate a  degree of renal artery stenosis on the right. In this regard, question whether patient is hypertensive.  Study otherwise unremarkable.   Electronically Signed By: Lowella Grip III M.D. On: 08/20/2020 11:42  Renal ultrasound was personally reviewed today.  Agree with radiologic interpretation.  Assessment & Plan:    1. Renal cyst Stable, minimally complex right renal cyst, probable Bosniak 2.  Given that its been incompletely characterized and mildly complex, will continue to follow with serial imaging.  We'll plan for renal ultrasound in 1 year.  If there is been significant interval growth, she'll need to MRI for for further/full characterization.  She is agreeable this plan. - US RENAL; Future  2. OAB (overactive bladder) Gradual onset of increased urinary urgency frequency and occasional incontinence episodes consistent with OAB.  Urinalysis today is unremarkable without concern for infection is contributing factor.  We discussed behavioral modification at length.  She is going to cut back on coffee and tea.  We discussed the alternative including anticholinergic or beta 3 agonist is first-line pharmacal intervention.  We discussed the risk and benefits.  She like to hold off for the time being and will work on behavioral modification first.  If her symptoms worsen, she'll return sooner. - Urinalysis, Complete w Microscopic; Future   F/u 1 year with [facility]  Hollice Espy, MD  [facility] [address] [place], [place] [address] [phone]

## 2020-09-11 DIAGNOSIS — Z23 Encounter for immunization: Secondary | ICD-10-CM | POA: Diagnosis not present

## 2020-09-14 ENCOUNTER — Other Ambulatory Visit: Payer: Self-pay | Admitting: Internal Medicine

## 2020-09-18 DIAGNOSIS — E538 Deficiency of other specified B group vitamins: Secondary | ICD-10-CM | POA: Diagnosis not present

## 2020-10-04 ENCOUNTER — Other Ambulatory Visit: Payer: Medicare Other

## 2020-10-09 ENCOUNTER — Ambulatory Visit: Payer: Medicare Other | Admitting: Internal Medicine

## 2020-10-16 ENCOUNTER — Ambulatory Visit (INDEPENDENT_AMBULATORY_CARE_PROVIDER_SITE_OTHER): Payer: Medicare Other

## 2020-10-16 VITALS — Ht 60.0 in | Wt 114.0 lb

## 2020-10-16 DIAGNOSIS — Z1159 Encounter for screening for other viral diseases: Secondary | ICD-10-CM

## 2020-10-16 DIAGNOSIS — Z0000 Encounter for general adult medical examination without abnormal findings: Secondary | ICD-10-CM | POA: Diagnosis not present

## 2020-10-16 NOTE — Progress Notes (Signed)
Subjective:   Sanskriti Pross is a 70 y.o. female who presents for Medicare Annual (Subsequent) preventive examination.  Review of Systems    No ROS.  Medicare Wellness Virtual Visit.   Cardiac Risk Factors include: advanced age (>7men, >40 women)     Objective:    Today's Vitals   10/16/20 1112  Weight: 114 lb (51.7 kg)  Height: 5' (1.524 m)   Body mass index is 22.26 kg/m.  Advanced Directives 10/16/2020 02/02/2019  Does Patient Have a Medical Advance Directive? No No  Would patient like information on creating a medical advance directive? No - Patient declined Yes (MAU/Ambulatory/Procedural Areas - Information given)    Current Medications (verified) Outpatient Encounter Medications as of 10/16/2020  Medication Sig  . atorvastatin (LIPITOR) 20 MG tablet TAKE 1 TABLET BY MOUTH AT BEDTIME FOR CHOLESTEROL  . cholecalciferol (VITAMIN D) 1000 units tablet Take 2,000 Units by mouth daily.  . Cyanocobalamin (VITAMIN B12) 1000 MCG TBCR   . Esomeprazole Magnesium (NEXIUM PO) Take by mouth.  . fluticasone (FLONASE) 50 MCG/ACT nasal spray Place 2 sprays into both nostrils daily.  Marland Kitchen levothyroxine (SYNTHROID) 88 MCG tablet TAKE 1 TABLET IN THE MORNING FOR THYROID  . sertraline (ZOLOFT) 25 MG tablet TAKE (1) OR (2) TABLETS BY MOUTH DAILY.  . verapamil (CALAN-SR) 120 MG CR tablet Take 120 mg by mouth daily.   No facility-administered encounter medications on file as of 10/16/2020.    Allergies (verified) Actonel [risedronate sodium] and Penicillins   History: Past Medical History:  Diagnosis Date  . Allergy   . Hyperlipidemia   . Thyroid disease    Past Surgical History:  Procedure Laterality Date  . ABDOMINAL HYSTERECTOMY     previous abnormal pap smear  . BREAST BIOPSY    . TONSILLECTOMY     Family History  Problem Relation Age of Onset  . Arthritis Mother   . Stroke Mother   . Hypertension Mother   . Arthritis Father   . Heart disease Father   . Heart disease Brother    . Cancer Maternal Aunt        breast   Social History   Socioeconomic History  . Marital status: Married    Spouse name: Not on file  . Number of children: Not on file  . Years of education: Not on file  . Highest education level: Not on file  Occupational History  . Not on file  Tobacco Use  . Smoking status: Current Every Day Smoker    Packs/day: 1.00    Years: 47.00    Pack years: 47.00    Types: Cigarettes  . Smokeless tobacco: Never Used  Substance and Sexual Activity  . Alcohol use: Yes    Alcohol/week: 7.0 standard drinks    Types: 7 Glasses of wine per week    Comment: 1 glass of wine per night  . Drug use: Not on file  . Sexual activity: Not on file  Other Topics Concern  . Not on file  Social History Narrative  . Not on file   Social Determinants of Health   Financial Resource Strain: Low Risk   . Difficulty of Paying Living Expenses: Not hard at all  Food Insecurity: No Food Insecurity  . Worried About Charity fundraiser in the Last Year: Never true  . Ran Out of Food in the Last Year: Never true  Transportation Needs: No Transportation Needs  . Lack of Transportation (Medical): No  . Lack of  Transportation (Non-Medical): No  Physical Activity: Not on file  Stress: No Stress Concern Present  . Feeling of Stress : Not at all  Social Connections: Unknown  . Frequency of Communication with Friends and Family: More than three times a week  . Frequency of Social Gatherings with Friends and Family: More than three times a week  . Attends Religious Services: Not on file  . Active Member of Clubs or Organizations: Not on file  . Attends Banker Meetings: Not on file  . Marital Status: Married    Tobacco Counseling Ready to quit: Not Answered Counseling given: Not Answered   Clinical Intake:  Pre-visit preparation completed: Yes        Diabetes: No  How often do you need to have someone help you when you read instructions,  pamphlets, or other written materials from your doctor or pharmacy?: 1 - Never  Interpreter Needed?: No      Activities of Daily Living In your present state of health, do you have any difficulty performing the following activities: 10/16/2020  Hearing? N  Vision? N  Difficulty concentrating or making decisions? N  Walking or climbing stairs? N  Dressing or bathing? N  Doing errands, shopping? N  Preparing Food and eating ? N  Using the Toilet? N  In the past six months, have you accidently leaked urine? N  Do you have problems with loss of bowel control? N  Managing your Medications? N  Managing your Finances? N  Housekeeping or managing your Housekeeping? N  Some recent data might be hidden    Patient Care Team: Dale Conception, MD as PCP - General (Internal Medicine)  Indicate any recent Medical Services you may have received from other than [facility] providers in the past year (date may be approximate).     Assessment:   This is a routine wellness examination for [employer].  I connected with Robena today by telephone and verified that I am speaking with the correct person using two identifiers. Location patient: home Location provider: work Persons participating in the virtual visit: patient, Engineer, civil (consulting).    I discussed the limitations, risks, security and privacy concerns of performing an evaluation and management service by telephone and the availability of in person appointments. The patient expressed understanding and verbally consented to this telephonic visit.    Interactive audio and video telecommunications were attempted between this provider and patient, however failed, due to patient having technical difficulties OR patient did not have access to video capability.  We continued and completed visit with audio only.  Some vital signs may be absent or patient reported.   Hearing/Vision screen  Hearing Screening   125Hz  250Hz  500Hz  1000Hz  2000Hz  3000Hz  4000Hz  6000Hz  8000Hz    Right ear:           Left ear:           Comments: Patient is able to hear conversational tones without difficulty.  No issues reported.   Vision Screening Comments: Followed by Vision  Wears corrective lenses  Annual visits  No screening, virtual visit. She has regular follow up with her ophthalmologist  Dietary issues and exercise activities discussed: Current Exercise Habits: The patient does not participate in regular exercise at present  Regular diet Good water intake  Goals      Patient Stated   .  Increase physical activity (pt-stated)      Walk for exercise      Depression Screen [facility] 2/9 Scores 10/16/2020 04/05/2020 02/02/2019  08/28/2017 08/21/2016  PHQ - 2 Score 0 0 0 0 0  PHQ- 9 Score - - - 0 -    Fall Risk Fall Risk  10/16/2020 04/05/2020 02/02/2019 08/28/2017 08/21/2016  Falls in the past year? 0 0 0 Yes No  Number falls in past yr: 0 - - 1 -  Injury with Fall? 0 - - Yes -  Risk for fall due to : - - - Impaired balance/gait -  Follow up Falls evaluation completed Falls evaluation completed - Falls prevention discussed -    FALL RISK PREVENTION PERTAINING TO THE HOME: Handrails in use when climbing stairs? Yes Home free of loose throw rugs in walkways, pet beds, electrical cords, etc? Yes  Adequate lighting in your home to reduce risk of falls? Yes   ASSISTIVE DEVICES UTILIZED TO PREVENT FALLS: Life alert? No  Use of a cane, walker or w/c? No   TIMED UP AND GO: Was the test performed? No . Virtual visit.  Cognitive Function:     6CIT Screen 10/16/2020 02/02/2019  What Year? 0 points 0 points  What month? 0 points 0 points  What time? - 0 points  Count back from 20 - 0 points  Months in reverse 0 points 0 points  Repeat phrase 0 points 0 points  Total Score - 0    Immunizations Immunization History  Administered Date(s) Administered  . Influenza,inj,quad, With Preservative 07/27/2019  . Influenza-Unspecified 07/30/2018  . Moderna Sars-Covid-2  Vaccination 02/21/2020, 03/23/2020, 09/11/2020  . Pneumococcal Conjugate-13 10/31/2016  . Pneumococcal Polysaccharide-23 08/16/2019  . Tdap 01/19/2017   Health Maintenance Health Maintenance  Topic Date Due  . Hepatitis C Screening  Never done  . COLONOSCOPY (Pts 45-15yrs Insurance coverage will need to be confirmed)  Never done  . MAMMOGRAM  02/19/2022  . TETANUS/TDAP  01/20/2027  . INFLUENZA VACCINE  Completed  . DEXA SCAN  Completed  . COVID-19 Vaccine  Completed  . PNA vac Low Risk Adult  Completed   Colonoscopy- 2002. Plans to discuss with pcp and schedule.   Mammogram status: Completed 02/20/20. Repeat every year    Bone Density- 02/26/17. Osteopenia.  cholecalciferol (VITAMIN D) 1000 units tablet.  Lung Cancer Screening: (Low Dose CT Chest recommended if Age 51-80 years, 30 pack-year currently smoking OR have quit w/in 15years.) does qualify.  Last visit 01/24/20.  Hepatitis C Screening: does qualify; consent given to order.   Vision Screening: Recommended annual ophthalmology exams for early detection of glaucoma and other disorders of the eye. Is the patient up to date with their annual eye exam?  Yes  Who is the provider or what is the name of the office in which the patient attends annual eye exams? [facility]. Visit every 12 months.   Dental Screening: Recommended annual dental exams for proper oral hygiene.  Community Resource Referral / Chronic Care Management: CRR required this visit?  No   CCM required this visit?  No      Plan:   Keep all routine maintenance appointments.   Next scheduled lab 10/24/20 @ 9:15  Cpe 10/26/20 @ 2:00  I have personally reviewed and noted the following in the patient's chart:   . Medical and social history . Use of alcohol, tobacco or illicit drugs  . Current medications and supplements . Functional ability and status . Nutritional status . Physical activity . Advanced directives . List of other  physicians . Hospitalizations, surgeries, and ER visits in previous 12 months . Vitals .  Screenings to include cognitive, depression, and falls . Referrals and appointments  In addition, I have reviewed and discussed with patient certain preventive protocols, quality metrics, and best practice recommendations. A written personalized care plan for preventive services as well as general preventive health recommendations were provided to patient via mychart.     Varney Biles, LPN   [id number]

## 2020-10-16 NOTE — Patient Instructions (Addendum)
Leah Benitez , Thank you for taking time to come for your Medicare Wellness Visit. I appreciate your ongoing commitment to your health goals. Please review the following plan we discussed and let me know if I can assist you in the future.   These are the goals we discussed: Goals      Patient Stated   .  Increase physical activity (pt-stated)      Walk for exercise       This is a list of the screening recommended for you and due dates:  Health Maintenance  Topic Date Due  .  Hepatitis C: One time screening is recommended by Center for Disease Control  (CDC) for  adults born from [birth year] through 1965.   Never done  . Colon Cancer Screening  Never done  . Mammogram  02/19/2022  . Tetanus Vaccine  01/20/2027  . Flu Shot  Completed  . DEXA scan (bone density measurement)  Completed  . COVID-19 Vaccine  Completed  . Pneumonia vaccines  Completed    Immunizations Immunization History  Administered Date(s) Administered  . Influenza,inj,quad, With Preservative 07/27/2019  . Influenza-Unspecified 07/30/2018  . Pneumococcal Conjugate-13 10/31/2016  . Pneumococcal Polysaccharide-23 08/16/2019  . Tdap 01/19/2017   Keep all routine maintenance appointments.   Next scheduled lab 10/24/20 @ 9:15  Cpe 10/26/20 @ 2:00  Advanced directives: not yet completed.  Conditions/risks identified: none new  Follow up in one year for your annual wellness visit    Preventive Care 65 Years and Older, Female Preventive care refers to lifestyle choices and visits with your health care provider that can promote health and wellness. What does preventive care include?  A yearly physical exam. This is also called an annual well check.  Dental exams once or twice a year.  Routine eye exams. Ask your health care provider how often you should have your eyes checked.  Personal lifestyle choices, including:  Daily care of your teeth and gums.  Regular physical activity.  Eating a healthy  diet.  Avoiding tobacco and drug use.  Limiting alcohol use.  Practicing safe sex.  Taking low-dose aspirin every day.  Taking vitamin and mineral supplements as recommended by your health care provider. What happens during an annual well check? The services and screenings done by your health care provider during your annual well check will depend on your age, overall health, lifestyle risk factors, and family history of disease. Counseling  Your health care provider may ask you questions about your:  Alcohol use.  Tobacco use.  Drug use.  Emotional well-being.  Home and relationship well-being.  Sexual activity.  Eating habits.  History of falls.  Memory and ability to understand (cognition).  Work and work Statistician.  Reproductive health. Screening  You may have the following tests or measurements:  Height, weight, and BMI.  Blood pressure.  Lipid and cholesterol levels. These may be checked every 5 years, or more frequently if you are over 8 years old.  Skin check.  Lung cancer screening. You may have this screening every year starting at age 82 if you have a 30-pack-year history of smoking and currently smoke or have quit within the past 15 years.  Fecal occult blood test (FOBT) of the stool. You may have this test every year starting at age 98.  Flexible sigmoidoscopy or colonoscopy. You may have a sigmoidoscopy every 5 years or a colonoscopy every 10 years starting at age 20.  Hepatitis C blood test.  Hepatitis B blood  test.  Sexually transmitted disease (STD) testing.  Diabetes screening. This is done by checking your blood sugar (glucose) after you have not eaten for a while (fasting). You may have this done every 1-3 years.  Bone density scan. This is done to screen for osteoporosis. You may have this done starting at age 70.  Mammogram. This may be done every 1-2 years. Talk to your health care provider about how often you should have  regular mammograms. Talk with your health care provider about your test results, treatment options, and if necessary, the need for more tests. Vaccines  Your health care provider may recommend certain vaccines, such as:  Influenza vaccine. This is recommended every year.  Tetanus, diphtheria, and acellular pertussis (Tdap, Td) vaccine. You may need a Td booster every 10 years.  Zoster vaccine. You may need this after age 77.  Pneumococcal 13-valent conjugate (PCV13) vaccine. One dose is recommended after age 62.  Pneumococcal polysaccharide (PPSV23) vaccine. One dose is recommended after age 9. Talk to your health care provider about which screenings and vaccines you need and how often you need them. This information is not intended to replace advice given to you by your health care provider. Make sure you discuss any questions you have with your health care provider. Document Released: 10/26/2015 Document Revised: 06/18/2016 Document Reviewed: 07/31/2015 Elsevier Interactive Patient Education  2017 [place] Prevention in the Home Falls can cause injuries. They can happen to people of all ages. There are many things you can do to make your home safe and to help prevent falls. What can I do on the outside of my home?  Regularly fix the edges of walkways and driveways and fix any cracks.  Remove anything that might make you trip as you walk through a door, such as a raised step or threshold.  Trim any bushes or trees on the path to your home.  Use bright outdoor lighting.  Clear any walking paths of anything that might make someone trip, such as rocks or tools.  Regularly check to see if handrails are loose or broken. Make sure that both sides of any steps have handrails.  Any raised decks and porches should have guardrails on the edges.  Have any leaves, snow, or ice cleared regularly.  Use sand or salt on walking paths during winter.  Clean up any spills in your  garage right away. This includes oil or grease spills. What can I do in the bathroom?  Use night lights.  Install grab bars by the toilet and in the tub and shower. Do not use towel bars as grab bars.  Use non-skid mats or decals in the tub or shower.  If you need to sit down in the shower, use a plastic, non-slip stool.  Keep the floor dry. Clean up any water that spills on the floor as soon as it happens.  Remove soap buildup in the tub or shower regularly.  Attach bath mats securely with double-sided non-slip rug tape.  Do not have throw rugs and other things on the floor that can make you trip. What can I do in the bedroom?  Use night lights.  Make sure that you have a light by your bed that is easy to reach.  Do not use any sheets or blankets that are too big for your bed. They should not hang down onto the floor.  Have a firm chair that has side arms. You can use this for support while  you get dressed.  Do not have throw rugs and other things on the floor that can make you trip. What can I do in the kitchen?  Clean up any spills right away.  Avoid walking on wet floors.  Keep items that you use a lot in easy-to-reach places.  If you need to reach something above you, use a strong step stool that has a grab bar.  Keep electrical cords out of the way.  Do not use floor polish or wax that makes floors slippery. If you must use wax, use non-skid floor wax.  Do not have throw rugs and other things on the floor that can make you trip. What can I do with my stairs?  Do not leave any items on the stairs.  Make sure that there are handrails on both sides of the stairs and use them. Fix handrails that are broken or loose. Make sure that handrails are as long as the stairways.  Check any carpeting to make sure that it is firmly attached to the stairs. Fix any carpet that is loose or worn.  Avoid having throw rugs at the top or bottom of the stairs. If you do have throw  rugs, attach them to the floor with carpet tape.  Make sure that you have a light switch at the top of the stairs and the bottom of the stairs. If you do not have them, ask someone to add them for you. What else can I do to help prevent falls?  Wear shoes that:  Do not have high heels.  Have rubber bottoms.  Are comfortable and fit you well.  Are closed at the toe. Do not wear sandals.  If you use a stepladder:  Make sure that it is fully opened. Do not climb a closed stepladder.  Make sure that both sides of the stepladder are locked into place.  Ask someone to hold it for you, if possible.  Clearly mark and make sure that you can see:  Any grab bars or handrails.  First and last steps.  Where the edge of each step is.  Use tools that help you move around (mobility aids) if they are needed. These include:  Canes.  Walkers.  Scooters.  Crutches.  Turn on the lights when you go into a dark area. Replace any light bulbs as soon as they burn out.  Set up your furniture so you have a clear path. Avoid moving your furniture around.  If any of your floors are uneven, fix them.  If there are any pets around you, be aware of where they are.  Review your medicines with your doctor. Some medicines can make you feel dizzy. This can increase your chance of falling. Ask your doctor what other things that you can do to help prevent falls. This information is not intended to replace advice given to you by your health care provider. Make sure you discuss any questions you have with your health care provider. Document Released: 07/26/2009 Document Revised: 03/06/2016 Document Reviewed: 11/03/2014 Elsevier Interactive Patient Education  2017 [employer].

## 2020-10-19 DIAGNOSIS — E538 Deficiency of other specified B group vitamins: Secondary | ICD-10-CM | POA: Diagnosis not present

## 2020-10-24 ENCOUNTER — Other Ambulatory Visit: Payer: Self-pay

## 2020-10-24 ENCOUNTER — Other Ambulatory Visit (INDEPENDENT_AMBULATORY_CARE_PROVIDER_SITE_OTHER): Payer: Medicare Other

## 2020-10-24 DIAGNOSIS — E7800 Pure hypercholesterolemia, unspecified: Secondary | ICD-10-CM | POA: Diagnosis not present

## 2020-10-24 DIAGNOSIS — Z1159 Encounter for screening for other viral diseases: Secondary | ICD-10-CM | POA: Diagnosis not present

## 2020-10-24 LAB — LIPID PANEL
Cholesterol: 190 mg/dL (ref 0–200)
HDL: 78.7 mg/dL (ref >39.00)
LDL Cholesterol: 95 mg/dL (ref 0–99)
NonHDL: 110.96
Total CHOL/HDL Ratio: 2
Triglycerides: 78 mg/dL (ref 0.0–149.0)
VLDL: 15.6 mg/dL (ref 0.0–40.0)

## 2020-10-24 LAB — HEPATIC FUNCTION PANEL
ALT: 30 U/L (ref 0–35)
AST: 29 U/L (ref 0–37)
Albumin: 4.7 g/dL (ref 3.5–5.2)
Alkaline Phosphatase: 68 U/L (ref 39–117)
Bilirubin, Direct: 0.1 mg/dL (ref 0.0–0.3)
Total Bilirubin: 0.5 mg/dL (ref 0.2–1.2)
Total Protein: 7 g/dL (ref 6.0–8.3)

## 2020-10-24 LAB — BASIC METABOLIC PANEL
BUN: 10 mg/dL (ref 6–23)
CO2: 31 mEq/L (ref 19–32)
Calcium: 9.9 mg/dL (ref 8.4–10.5)
Chloride: 103 mEq/L (ref 96–112)
Creatinine, Ser: 0.7 mg/dL (ref 0.40–1.20)
GFR: 88.28 mL/min (ref >60.00)
Glucose, Bld: 88 mg/dL (ref 70–99)
Potassium: 4.8 mEq/L (ref 3.5–5.1)
Sodium: 140 mEq/L (ref 135–145)

## 2020-10-25 LAB — HEPATITIS C ANTIBODY
Hepatitis C Ab: NONREACTIVE
SIGNAL TO CUT-OFF: 0.01 (ref <1.00)

## 2020-10-26 ENCOUNTER — Ambulatory Visit (INDEPENDENT_AMBULATORY_CARE_PROVIDER_SITE_OTHER): Payer: Medicare Other | Admitting: Internal Medicine

## 2020-10-26 ENCOUNTER — Other Ambulatory Visit: Payer: Self-pay

## 2020-10-26 DIAGNOSIS — E039 Hypothyroidism, unspecified: Secondary | ICD-10-CM

## 2020-10-26 DIAGNOSIS — N281 Cyst of kidney, acquired: Secondary | ICD-10-CM | POA: Diagnosis not present

## 2020-10-26 DIAGNOSIS — I700 Atherosclerosis of aorta: Secondary | ICD-10-CM

## 2020-10-26 DIAGNOSIS — R519 Headache, unspecified: Secondary | ICD-10-CM

## 2020-10-26 DIAGNOSIS — Z0000 Encounter for general adult medical examination without abnormal findings: Secondary | ICD-10-CM

## 2020-10-26 DIAGNOSIS — E7800 Pure hypercholesterolemia, unspecified: Secondary | ICD-10-CM | POA: Diagnosis not present

## 2020-10-26 DIAGNOSIS — F419 Anxiety disorder, unspecified: Secondary | ICD-10-CM

## 2020-10-26 DIAGNOSIS — F439 Reaction to severe stress, unspecified: Secondary | ICD-10-CM

## 2020-10-26 NOTE — Progress Notes (Signed)
Patient ID: Leah Benitez, female   DOB: 04-08-1951, 70 y.o.   MRN: [id number]   Subjective:    Patient ID: Leah Benitez, female    DOB: August 17, 1951, 69 y.o.   MRN: [id number]  HPI This visit occurred during the SARS-CoV-2 public health emergency.  Safety protocols were in place, including screening questions prior to the visit, additional usage of staff PPE, and extensive cleaning of exam room while observing appropriate contact time as indicated for disinfecting solutions.  Patient with past history of hypercholesterolemia and hypothyroidism.  She comes in today to follow up on these issues as well as for a complete physical exam.  She reports she is doing better.  Taking zoloft for increased stress.  Doing better.  Feels zoloft dose working well.  Trying to stay active.  No chest pain or sob.  No acid reflux.  No abdominal pain.  Bowels moving.  Headaches much improved.  Taking verapamil.  Much better on this medication.  Blood pressure ok.   Past Medical History:  Diagnosis Date  . Allergy   . Hyperlipidemia   . Thyroid disease    Past Surgical History:  Procedure Laterality Date  . ABDOMINAL HYSTERECTOMY     previous abnormal pap smear  . BREAST BIOPSY    . TONSILLECTOMY     Family History  Problem Relation Age of Onset  . Arthritis Mother   . Stroke Mother   . Hypertension Mother   . Arthritis Father   . Heart disease Father   . Heart disease Brother   . Cancer Maternal Aunt        breast   Social History   Socioeconomic History  . Marital status: Married    Spouse name: Not on file  . Number of children: Not on file  . Years of education: Not on file  . Highest education level: Not on file  Occupational History  . Not on file  Tobacco Use  . Smoking status: Current Every Day Smoker    Packs/day: 1.00    Years: 47.00    Pack years: 47.00    Types: Cigarettes  . Smokeless tobacco: Never Used  Substance and Sexual Activity  . Alcohol use: Yes    Alcohol/week: 7.0  standard drinks    Types: 7 Glasses of wine per week    Comment: 1 glass of wine per night  . Drug use: Not on file  . Sexual activity: Not on file  Other Topics Concern  . Not on file  Social History Narrative  . Not on file   Social Determinants of Health   Financial Resource Strain: Low Risk   . Difficulty of Paying Living Expenses: Not hard at all  Food Insecurity: No Food Insecurity  . Worried About Charity fundraiser in the Last Year: Never true  . Ran Out of Food in the Last Year: Never true  Transportation Needs: No Transportation Needs  . Lack of Transportation (Medical): No  . Lack of Transportation (Non-Medical): No  Physical Activity: Not on file  Stress: No Stress Concern Present  . Feeling of Stress : Not at all  Social Connections: Unknown  . Frequency of Communication with Friends and Family: More than three times a week  . Frequency of Social Gatherings with Friends and Family: More than three times a week  . Attends Religious Services: Not on file  . Active Member of Clubs or Organizations: Not on file  . Attends Archivist Meetings: Not  on file  . Marital Status: Married    Outpatient Encounter Medications as of 10/26/2020  Medication Sig  . atorvastatin (LIPITOR) 20 MG tablet TAKE 1 TABLET BY MOUTH AT BEDTIME FOR CHOLESTEROL  . cholecalciferol (VITAMIN D) 1000 units tablet Take 2,000 Units by mouth daily.  . Cyanocobalamin (VITAMIN B12) 1000 MCG TBCR   . Esomeprazole Magnesium (NEXIUM PO) Take by mouth.  . fluticasone (FLONASE) 50 MCG/ACT nasal spray Place 2 sprays into both nostrils daily.  Marland Kitchen levothyroxine (SYNTHROID) 88 MCG tablet TAKE 1 TABLET IN THE MORNING FOR THYROID  . sertraline (ZOLOFT) 25 MG tablet TAKE (1) OR (2) TABLETS BY MOUTH DAILY.  . verapamil (CALAN-SR) 120 MG CR tablet Take 120 mg by mouth daily.   No facility-administered encounter medications on file as of 10/26/2020.    Review of Systems  Constitutional: Negative for  appetite change and unexpected weight change.  HENT: Negative for congestion, sinus pressure and sore throat.   Eyes: Negative for pain and visual disturbance.  Respiratory: Negative for cough, chest tightness and shortness of breath.   Cardiovascular: Negative for chest pain, palpitations and leg swelling.  Gastrointestinal: Negative for abdominal pain, diarrhea, nausea and vomiting.  Genitourinary: Negative for difficulty urinating and dysuria.  Musculoskeletal: Negative for joint swelling and myalgias.  Skin: Negative for color change and rash.  Neurological: Negative for dizziness and light-headedness.       Headaches improved.   Hematological: Negative for adenopathy. Does not bruise/bleed easily.  Psychiatric/Behavioral: Negative for agitation and dysphoric mood.       Objective:    Physical Exam Vitals reviewed.  Constitutional:      General: She is not in acute distress.    Appearance: Normal appearance. She is well-developed and well-nourished.  HENT:     Head: Normocephalic and atraumatic.     Right Ear: External ear normal.     Left Ear: External ear normal.     Mouth/Throat:     Mouth: Oropharynx is clear and moist.  Eyes:     General: No scleral icterus.       Right eye: No discharge.        Left eye: No discharge.     Conjunctiva/sclera: Conjunctivae normal.  Neck:     Thyroid: No thyromegaly.  Cardiovascular:     Rate and Rhythm: Normal rate and regular rhythm.  Pulmonary:     Effort: No tachypnea, accessory muscle usage or respiratory distress.     Breath sounds: Normal breath sounds. No decreased breath sounds or wheezing.  Chest:  Breasts:     Right: No inverted nipple, mass, nipple discharge or tenderness (no axillary adenopathy).     Left: No inverted nipple, mass, nipple discharge or tenderness (no axilarry adenopathy).    Abdominal:     General: Bowel sounds are normal.     Palpations: Abdomen is soft.     Tenderness: There is no abdominal  tenderness.  Musculoskeletal:        General: No swelling, tenderness or edema.     Cervical back: Neck supple. No tenderness.  Lymphadenopathy:     Cervical: No cervical adenopathy.  Skin:    Findings: No erythema or rash.  Neurological:     Mental Status: She is alert and oriented to person, place, and time.  Psychiatric:        Mood and Affect: Mood and affect and mood normal.        Behavior: Behavior normal.     BP 122/70  Pulse 71   Temp 98.2 F (36.8 C) (Oral)   Resp 16   Ht 5' (1.524 m)   Wt 115 lb (52.2 kg)   SpO2 97%   BMI 22.46 kg/m  Wt Readings from Last 3 Encounters:  10/26/20 115 lb (52.2 kg)  10/16/20 114 lb (51.7 kg)  08/24/20 114 lb (51.7 kg)     Lab Results  Component Value Date   WBC 9.9 04/26/2020   HGB 13.7 04/26/2020   HCT 40.7 04/26/2020   PLT 266.0 04/26/2020   GLUCOSE 88 10/24/2020   CHOL 190 10/24/2020   TRIG 78.0 10/24/2020   HDL 78.70 10/24/2020   LDLCALC 95 10/24/2020   ALT 30 10/24/2020   AST 29 10/24/2020   NA 140 10/24/2020   K 4.8 10/24/2020   CL 103 10/24/2020   CREATININE 0.70 10/24/2020   BUN 10 10/24/2020   CO2 31 10/24/2020   TSH 2.30 04/26/2020       Assessment & Plan:   Problem List Items Addressed This Visit    Anxiety    Doing well on zoloft.  Follow.       Aortic atherosclerosis (HCC)    Continue lipitor.        Headache    Headaches better.  On verapamil.  Much improved.  Follow.        Healthcare maintenance    Physical today 10/26/20.  Mammogram 02/20/20 - Birads I.  Discussed colonoscopy.  She will notify me when agreeable for referral.        Hypercholesterolemia    On lipitor.  Low cholesterol diet and exercise.  Follow lipid panel and liver function tests.        Relevant Orders   Hepatic function panel   Lipid panel   Basic metabolic panel   Hypothyroidism    On thyroid replacement.  Follow tsh.       Renal cyst    Followed by urology.  Recommended f/u ultrasound in one year.        Stress    Discussed. On zoloft.  Doing well on current dose.  Follow.  Continue zoloft.           Einar Pheasant, MD

## 2020-10-27 ENCOUNTER — Encounter: Payer: Self-pay | Admitting: Internal Medicine

## 2020-10-27 DIAGNOSIS — R519 Headache, unspecified: Secondary | ICD-10-CM | POA: Insufficient documentation

## 2020-10-27 NOTE — Assessment & Plan Note (Signed)
Followed by urology.  Recommended f/u ultrasound in one year.

## 2020-10-27 NOTE — Assessment & Plan Note (Signed)
On lipitor.  Low cholesterol diet and exercise.  Follow lipid panel and liver function tests.   

## 2020-10-27 NOTE — Assessment & Plan Note (Signed)
On thyroid replacement.  Follow tsh.  

## 2020-10-27 NOTE — Assessment & Plan Note (Signed)
Continue lipitor  ?

## 2020-10-27 NOTE — Assessment & Plan Note (Signed)
Discussed. On zoloft.  Doing well on current dose.  Follow.  Continue zoloft.

## 2020-10-27 NOTE — Assessment & Plan Note (Signed)
Physical today 10/26/20.  Mammogram 02/20/20 - Birads I.  Discussed colonoscopy.  She will notify me when agreeable for referral.

## 2020-10-27 NOTE — Assessment & Plan Note (Signed)
Doing well on zoloft.  Follow.   

## 2020-10-27 NOTE — Assessment & Plan Note (Signed)
Headaches better.  On verapamil.  Much improved.  Follow.

## 2020-11-13 ENCOUNTER — Other Ambulatory Visit: Payer: Self-pay | Admitting: Internal Medicine

## 2020-11-30 DIAGNOSIS — E538 Deficiency of other specified B group vitamins: Secondary | ICD-10-CM | POA: Diagnosis not present

## 2020-12-14 DIAGNOSIS — E538 Deficiency of other specified B group vitamins: Secondary | ICD-10-CM | POA: Diagnosis not present

## 2020-12-28 ENCOUNTER — Telehealth: Payer: Self-pay

## 2021-01-08 ENCOUNTER — Telehealth: Payer: Self-pay | Admitting: *Deleted

## 2021-01-08 NOTE — Telephone Encounter (Signed)
Attempted to contact patient to schedule lung screening. Left message to call Shawn at [phone]. 

## 2021-01-09 ENCOUNTER — Other Ambulatory Visit: Payer: Self-pay | Admitting: *Deleted

## 2021-01-09 DIAGNOSIS — Z122 Encounter for screening for malignant neoplasm of respiratory organs: Secondary | ICD-10-CM

## 2021-01-09 DIAGNOSIS — F17200 Nicotine dependence, unspecified, uncomplicated: Secondary | ICD-10-CM

## 2021-01-09 DIAGNOSIS — Z87891 Personal history of nicotine dependence: Secondary | ICD-10-CM

## 2021-01-09 NOTE — Progress Notes (Signed)
Contacted and scheduled for annual lung screening scan. Patient is a current smoker with a 48 pack year history

## 2021-01-10 DIAGNOSIS — E538 Deficiency of other specified B group vitamins: Secondary | ICD-10-CM | POA: Diagnosis not present

## 2021-01-10 DIAGNOSIS — Z79899 Other long term (current) drug therapy: Secondary | ICD-10-CM | POA: Diagnosis not present

## 2021-01-25 ENCOUNTER — Other Ambulatory Visit: Payer: Self-pay

## 2021-01-25 ENCOUNTER — Ambulatory Visit
Admission: RE | Admit: 2021-01-25 | Discharge: 2021-01-25 | Disposition: A | Discharge status: Home / Self Care | Payer: Medicare Other | Source: Ambulatory Visit | Attending: Oncology | Admitting: Oncology

## 2021-01-25 DIAGNOSIS — F17200 Nicotine dependence, unspecified, uncomplicated: Secondary | ICD-10-CM | POA: Diagnosis not present

## 2021-01-25 DIAGNOSIS — Z87891 Personal history of nicotine dependence: Secondary | ICD-10-CM | POA: Insufficient documentation

## 2021-01-25 DIAGNOSIS — F17210 Nicotine dependence, cigarettes, uncomplicated: Secondary | ICD-10-CM | POA: Diagnosis not present

## 2021-01-25 DIAGNOSIS — Z122 Encounter for screening for malignant neoplasm of respiratory organs: Secondary | ICD-10-CM | POA: Insufficient documentation

## 2021-01-31 ENCOUNTER — Encounter: Payer: Self-pay | Admitting: *Deleted

## 2021-01-31 ENCOUNTER — Telehealth: Payer: Self-pay | Admitting: Internal Medicine

## 2021-01-31 NOTE — Telephone Encounter (Signed)
Please call and notify pt that I received her CT scan report.  Regarding her lungs - they felt everything ok.  Recommended continuing yearly CT scan.  They did mention some calcium deposits in arteries and calcifications on aortic valve.  Please schedule appt to discuss further evaluation.

## 2021-02-01 NOTE — Telephone Encounter (Signed)
LMTCB

## 2021-02-04 NOTE — Telephone Encounter (Signed)
PT called to return the phone call on 4/22. Please contact her on her mobile.

## 2021-02-04 NOTE — Telephone Encounter (Signed)
Noted on schedule °

## 2021-02-04 NOTE — Telephone Encounter (Signed)
If she is doing ok, ok to discuss at appt. Please make note on schedule.

## 2021-02-04 NOTE — Telephone Encounter (Signed)
Patient stated she has appt on 5/20 and was wondering if she could discuss with you then?

## 2021-02-08 ENCOUNTER — Other Ambulatory Visit: Payer: Self-pay | Admitting: Internal Medicine

## 2021-02-22 ENCOUNTER — Other Ambulatory Visit: Payer: Self-pay

## 2021-02-22 ENCOUNTER — Other Ambulatory Visit (INDEPENDENT_AMBULATORY_CARE_PROVIDER_SITE_OTHER): Payer: Medicare Other

## 2021-02-22 DIAGNOSIS — E7800 Pure hypercholesterolemia, unspecified: Secondary | ICD-10-CM

## 2021-02-22 LAB — HEPATIC FUNCTION PANEL
ALT: 14 U/L (ref 0–35)
AST: 17 U/L (ref 0–37)
Albumin: 4.5 g/dL (ref 3.5–5.2)
Alkaline Phosphatase: 59 U/L (ref 39–117)
Bilirubin, Direct: 0.1 mg/dL (ref 0.0–0.3)
Total Bilirubin: 0.5 mg/dL (ref 0.2–1.2)
Total Protein: 6.6 g/dL (ref 6.0–8.3)

## 2021-02-22 LAB — BASIC METABOLIC PANEL
BUN: 11 mg/dL (ref 6–23)
CO2: 29 mEq/L (ref 19–32)
Calcium: 9.4 mg/dL (ref 8.4–10.5)
Chloride: 101 mEq/L (ref 96–112)
Creatinine, Ser: 0.75 mg/dL (ref 0.40–1.20)
GFR: 81.08 mL/min (ref >60.00)
Glucose, Bld: 76 mg/dL (ref 70–99)
Potassium: 4 mEq/L (ref 3.5–5.1)
Sodium: 139 mEq/L (ref 135–145)

## 2021-02-22 LAB — LIPID PANEL
Cholesterol: 169 mg/dL (ref 0–200)
HDL: 66 mg/dL (ref >39.00)
LDL Cholesterol: 85 mg/dL (ref 0–99)
NonHDL: 103.27
Total CHOL/HDL Ratio: 3
Triglycerides: 89 mg/dL (ref 0.0–149.0)
VLDL: 17.8 mg/dL (ref 0.0–40.0)

## 2021-02-27 ENCOUNTER — Other Ambulatory Visit: Payer: Medicare Other

## 2021-03-01 ENCOUNTER — Other Ambulatory Visit: Payer: Self-pay

## 2021-03-01 ENCOUNTER — Ambulatory Visit (INDEPENDENT_AMBULATORY_CARE_PROVIDER_SITE_OTHER): Payer: Medicare Other | Admitting: Internal Medicine

## 2021-03-01 DIAGNOSIS — J432 Centrilobular emphysema: Secondary | ICD-10-CM | POA: Diagnosis not present

## 2021-03-01 DIAGNOSIS — I2510 Atherosclerotic heart disease of native coronary artery without angina pectoris: Secondary | ICD-10-CM

## 2021-03-01 DIAGNOSIS — I700 Atherosclerosis of aorta: Secondary | ICD-10-CM

## 2021-03-01 DIAGNOSIS — Z720 Tobacco use: Secondary | ICD-10-CM

## 2021-03-01 DIAGNOSIS — N281 Cyst of kidney, acquired: Secondary | ICD-10-CM

## 2021-03-01 DIAGNOSIS — E7800 Pure hypercholesterolemia, unspecified: Secondary | ICD-10-CM

## 2021-03-01 DIAGNOSIS — E039 Hypothyroidism, unspecified: Secondary | ICD-10-CM

## 2021-03-01 DIAGNOSIS — E538 Deficiency of other specified B group vitamins: Secondary | ICD-10-CM | POA: Diagnosis not present

## 2021-03-01 DIAGNOSIS — F439 Reaction to severe stress, unspecified: Secondary | ICD-10-CM | POA: Diagnosis not present

## 2021-03-01 NOTE — Progress Notes (Signed)
Patient ID: Leah Benitez, female   DOB: 1951/04/10, 70 y.o.   MRN: [id number]   Subjective:    Patient ID: Leah Benitez, female    DOB: 24-Dec-1950, 70 y.o.   MRN: [id number]  HPI This visit occurred during the SARS-CoV-2 public health emergency.  Safety protocols were in place, including screening questions prior to the visit, additional usage of staff PPE, and extensive cleaning of exam room while observing appropriate contact time as indicated for disinfecting solutions.  Patient here for scheduled follow up.  Here to follow up regarding her cholesterol, increased stress and to discuss recent CT scan.  Increased stress with family medical issues.  Daughter-n-law - cancer.  Aunt in [facility].  Discussed. On zoloft.  Has good support.  Does not feel needs any further intervention.  Seeing neurology for her headaches.  Has imitrex if needed.  Has not needed.  Has oxygen.  On verapamil.  Stays active.  No chest pain.  Breathing stable.  Recent CT chest - calcification in aorta and Left main and 3 vessel disease.  Discussed.  Given these findings and family history, agreeable for cardiology referral.  Some prn neck pain.  Certain movements aggravate. Takes prn advil. Desires no further intervention.  Bunion on foot.  Wants to hold on referral.  Breathing stable.  No increased cough or congestion.  Discussed the need to quit smoking.  No abdominal pain.  Bowels moving.    Past Medical History:  Diagnosis Date  . Allergy   . Hyperlipidemia   . Thyroid disease    Past Surgical History:  Procedure Laterality Date  . ABDOMINAL HYSTERECTOMY     previous abnormal pap smear  . BREAST BIOPSY    . TONSILLECTOMY     Family History  Problem Relation Age of Onset  . Arthritis Mother   . Stroke Mother   . Hypertension Mother   . Arthritis Father   . Heart disease Father   . Heart disease Brother   . Cancer Maternal Aunt        breast   Social History   Socioeconomic History  . Marital status: Married     Spouse name: Not on file  . Number of children: Not on file  . Years of education: Not on file  . Highest education level: Not on file  Occupational History  . Not on file  Tobacco Use  . Smoking status: Current Every Day Smoker    Packs/day: 1.00    Years: 47.00    Pack years: 47.00    Types: Cigarettes  . Smokeless tobacco: Never Used  Substance and Sexual Activity  . Alcohol use: Yes    Alcohol/week: 7.0 standard drinks    Types: 7 Glasses of wine per week    Comment: 1 glass of wine per night  . Drug use: Not on file  . Sexual activity: Not on file  Other Topics Concern  . Not on file  Social History Narrative  . Not on file   Social Determinants of Health   Financial Resource Strain: Low Risk   . Difficulty of Paying Living Expenses: Not hard at all  Food Insecurity: No Food Insecurity  . Worried About Charity fundraiser in the Last Year: Never true  . Ran Out of Food in the Last Year: Never true  Transportation Needs: No Transportation Needs  . Lack of Transportation (Medical): No  . Lack of Transportation (Non-Medical): No  Physical Activity: Not on file  Stress:  No Stress Concern Present  . Feeling of Stress : Not at all  Social Connections: Unknown  . Frequency of Communication with Friends and Family: More than three times a week  . Frequency of Social Gatherings with Friends and Family: More than three times a week  . Attends Religious Services: Not on file  . Active Member of Clubs or Organizations: Not on file  . Attends Archivist Meetings: Not on file  . Marital Status: Married    Outpatient Encounter Medications as of 03/01/2021  Medication Sig  . aspirin EC 81 MG tablet Take 1 tablet (81 mg total) by mouth daily. Swallow whole.  Marland Kitchen atorvastatin (LIPITOR) 20 MG tablet TAKE 1 TABLET BY MOUTH AT BEDTIME FOR CHOLESTEROL  . cholecalciferol (VITAMIN D) 1000 units tablet Take 2,000 Units by mouth daily.  . Cyanocobalamin (VITAMIN B12) 1000  MCG TBCR   . Esomeprazole Magnesium (NEXIUM PO) Take by mouth.  . fluticasone (FLONASE) 50 MCG/ACT nasal spray Place 2 sprays into both nostrils daily.  Marland Kitchen levothyroxine (SYNTHROID) 88 MCG tablet TAKE 1 TABLET IN THE MORNING FOR THYROID  . sertraline (ZOLOFT) 25 MG tablet TAKE (1) OR (2) TABLETS BY MOUTH DAILY.  . verapamil (CALAN-SR) 120 MG CR tablet Take 120 mg by mouth daily.   No facility-administered encounter medications on file as of 03/01/2021.    Review of Systems  Constitutional: Negative for appetite change and unexpected weight change.  HENT: Negative for congestion and sinus pressure.   Respiratory: Negative for cough, chest tightness and shortness of breath.   Cardiovascular: Negative for chest pain, palpitations and leg swelling.  Gastrointestinal: Negative for abdominal pain, diarrhea, nausea and vomiting.  Genitourinary: Negative for difficulty urinating and dysuria.  Musculoskeletal: Negative for joint swelling and myalgias.       Bunion foot. Intermittent neck pain.   Skin: Negative for color change and rash.  Neurological: Negative for dizziness, light-headedness and headaches.  Psychiatric/Behavioral: Negative for agitation and dysphoric mood.       Objective:    Physical Exam Vitals reviewed.  Constitutional:      General: She is not in acute distress.    Appearance: Normal appearance.  HENT:     Head: Normocephalic and atraumatic.     Right Ear: External ear normal.     Left Ear: External ear normal.  Eyes:     General: No scleral icterus.       Right eye: No discharge.        Left eye: No discharge.     Conjunctiva/sclera: Conjunctivae normal.  Neck:     Thyroid: No thyromegaly.  Cardiovascular:     Rate and Rhythm: Normal rate and regular rhythm.  Pulmonary:     Effort: No respiratory distress.     Breath sounds: Normal breath sounds. No wheezing.  Abdominal:     General: Bowel sounds are normal.     Palpations: Abdomen is soft.      Tenderness: There is no abdominal tenderness.  Musculoskeletal:        General: No swelling or tenderness.     Cervical back: Neck supple. No tenderness.     Comments: Bunion - foot.    Lymphadenopathy:     Cervical: No cervical adenopathy.  Skin:    Findings: No erythema or rash.  Neurological:     Mental Status: She is alert.  Psychiatric:        Mood and Affect: Mood normal.  Behavior: Behavior normal.     BP 132/72   Pulse 73   Temp 98 F (36.7 C) (Oral)   Resp 16   Ht 5' (1.524 m)   Wt 115 lb 9.6 oz (52.4 kg)   SpO2 96%   BMI 22.58 kg/m  Wt Readings from Last 3 Encounters:  03/01/21 115 lb 9.6 oz (52.4 kg)  01/25/21 117 lb (53.1 kg)  10/26/20 115 lb (52.2 kg)     Lab Results  Component Value Date   WBC 9.9 04/26/2020   HGB 13.7 04/26/2020   HCT 40.7 04/26/2020   PLT 266.0 04/26/2020   GLUCOSE 76 02/22/2021   CHOL 169 02/22/2021   TRIG 89.0 02/22/2021   HDL 66.00 02/22/2021   LDLCALC 85 02/22/2021   ALT 14 02/22/2021   AST 17 02/22/2021   NA 139 02/22/2021   K 4.0 02/22/2021   CL 101 02/22/2021   CREATININE 0.75 02/22/2021   BUN 11 02/22/2021   CO2 29 02/22/2021   TSH 2.30 04/26/2020    CT CHEST LUNG CANCER SCREENING LOW DOSE WO CONTRAST  Result Date: 01/28/2021 CLINICAL DATA:  70 year old female current smoker with 48 pack-year history of smoking. Lung cancer screening examination. EXAM: CT CHEST WITHOUT CONTRAST LOW-DOSE FOR LUNG CANCER SCREENING TECHNIQUE: Multidetector CT imaging of the chest was performed following the standard protocol without IV contrast. COMPARISON:  Low-dose lung cancer screening chest CT 01/24/2020. FINDINGS: Cardiovascular: Heart size is normal. There is no significant pericardial fluid, thickening or pericardial calcification. There is aortic atherosclerosis, as well as atherosclerosis of the great vessels of the mediastinum and the coronary arteries, including calcified atherosclerotic plaque in the left main, left  anterior descending, left circumflex and right coronary arteries. Mild calcifications of the aortic valve. Mediastinum/Nodes: No pathologically enlarged mediastinal or hilar lymph nodes. Please note that accurate exclusion of hilar adenopathy is limited on noncontrast CT scans. Esophagus is unremarkable in appearance. No axillary lymphadenopathy. Lungs/Pleura: Multiple small pulmonary nodules are again noted throughout the lungs bilaterally, largest of which is in the medial aspect of the right upper lobe (axial image 45 of series 3), with a volume derived mean diameter of 4.2 mm. No other larger more suspicious appearing pulmonary nodules or masses are noted. No acute consolidative airspace disease. No pleural effusions. Diffuse bronchial wall thickening with mild centrilobular and paraseptal emphysema. Upper Abdomen: Aortic atherosclerosis. Musculoskeletal: There are no aggressive appearing lytic or blastic lesions noted in the visualized portions of the skeleton. IMPRESSION: 1. Lung-RADS 2S, benign appearance or behavior. Continue annual screening with low-dose chest CT without contrast in 12 months. 2. The "S" modifier above refers to potentially clinically significant non lung cancer related findings. Specifically, there is aortic atherosclerosis, in addition to left main and 3 vessel coronary artery disease. Please note that although the presence of coronary artery calcium documents the presence of coronary artery disease, the severity of this disease and any potential stenosis cannot be assessed on this non-gated CT examination. Assessment for potential risk factor modification, dietary therapy or pharmacologic therapy may be warranted, if clinically indicated. 3. Mild diffuse bronchial wall thickening with mild centrilobular and paraseptal emphysema; imaging findings suggestive of underlying COPD. 4. There are calcifications of the aortic valve. Echocardiographic correlation for evaluation of potential  valvular dysfunction may be warranted if clinically indicated. Aortic Atherosclerosis (ICD10-I70.0) and Emphysema (ICD10-J43.9). Electronically Signed   By: Vinnie Langton M.D.   On: 01/28/2021 08:21       Assessment & Plan:  Problem List Items Addressed This Visit    Aortic atherosclerosis ([place])    Recent CT chest - calcification in aorta and Left main and 3 vessel disease.  Discussed.  Given these findings and family history, agreeable for cardiology referral.  Continue lipitor. Hold on increasing dose.        Relevant Medications   aspirin EC 81 MG tablet   B12 deficiency    Recheck b12 level with next labs.       Relevant Orders   Vitamin B12   CAD (coronary artery disease)    Recent CT chest - calcification in aorta and Left main and 3 vessel disease.  Discussed.   Discussed risk factor modification. Discussed the need to quit smoking.  Family history.  Given risk factors, family history and finding on CT scan, she is agreeable for cardiology referral.       Relevant Medications   aspirin EC 81 MG tablet   Other Relevant Orders   Ambulatory referral to Cardiology   Emphysema lung [facility])    Noted on recent CT scan.  Breathing stable.  Discussed the need to quit smoking.  Follow.       Hypercholesterolemia    Continue lipitor.  Low cholesterol diet and exercise.  Follow lipid panel and liver function tests.        Relevant Medications   aspirin EC 81 MG tablet   Other Relevant Orders   CBC with Differential/Platelet   Lipid panel   Hepatic function panel   Basic metabolic panel   Hypothyroidism    On thyroid replacement.  Follow tsh.       Relevant Orders   TSH   Renal cyst    Followed by urology.  Recommended f/u ultrasound in one year.        Stress    Increased stress as outlined.  Discussed.  On zoloft.  Has good support. Does not feel needs any further intervention.  Follow.       Tobacco abuse    Discussed the need to quit smoking.  She declines to  quit. Has cut down.  Follow.           Einar Pheasant, MD

## 2021-03-02 ENCOUNTER — Encounter: Payer: Self-pay | Admitting: Internal Medicine

## 2021-03-02 DIAGNOSIS — I2510 Atherosclerotic heart disease of native coronary artery without angina pectoris: Secondary | ICD-10-CM | POA: Insufficient documentation

## 2021-03-02 DIAGNOSIS — E538 Deficiency of other specified B group vitamins: Secondary | ICD-10-CM | POA: Insufficient documentation

## 2021-03-02 DIAGNOSIS — J439 Emphysema, unspecified: Secondary | ICD-10-CM | POA: Insufficient documentation

## 2021-03-02 MED ORDER — ASPIRIN EC 81 MG PO TBEC: 81.0000 mg | DELAYED_RELEASE_TABLET | Freq: Every day | ORAL | 0 refills | Status: AC

## 2021-03-02 NOTE — Assessment & Plan Note (Signed)
Followed by urology.  Recommended f/u ultrasound in one year.  

## 2021-03-02 NOTE — Assessment & Plan Note (Signed)
Recheck b12 level with next labs.   °

## 2021-03-02 NOTE — Assessment & Plan Note (Signed)
Recent CT chest - calcification in aorta and Left main and 3 vessel disease.  Discussed.  Given these findings and family history, agreeable for cardiology referral.  Continue lipitor. Hold on increasing dose.

## 2021-03-02 NOTE — Assessment & Plan Note (Signed)
Noted on recent CT scan.  Breathing stable.  Discussed the need to quit smoking.  Follow.

## 2021-03-02 NOTE — Assessment & Plan Note (Signed)
Discussed the need to quit smoking.  She declines to quit. Has cut down.  Follow.

## 2021-03-02 NOTE — Assessment & Plan Note (Signed)
Continue lipitor.  Low cholesterol diet and exercise.  Follow lipid panel and liver function tests.   

## 2021-03-02 NOTE — Assessment & Plan Note (Signed)
Recent CT chest - calcification in aorta and Left main and 3 vessel disease.  Discussed.   Discussed risk factor modification. Discussed the need to quit smoking.  Family history.  Given risk factors, family history and finding on CT scan, she is agreeable for cardiology referral.

## 2021-03-02 NOTE — Assessment & Plan Note (Signed)
Increased stress as outlined.  Discussed.  On zoloft.  Has good support. Does not feel needs any further intervention.  Follow.

## 2021-03-02 NOTE — Assessment & Plan Note (Signed)
On thyroid replacement.  Follow tsh.  

## 2021-04-12 DIAGNOSIS — Z1231 Encounter for screening mammogram for malignant neoplasm of breast: Secondary | ICD-10-CM | POA: Diagnosis not present

## 2021-04-12 LAB — HM MAMMOGRAPHY

## 2021-05-15 DIAGNOSIS — Z23 Encounter for immunization: Secondary | ICD-10-CM | POA: Diagnosis not present

## 2021-07-01 DIAGNOSIS — Z7689 Persons encountering health services in other specified circumstances: Secondary | ICD-10-CM | POA: Diagnosis not present

## 2021-07-01 DIAGNOSIS — R001 Bradycardia, unspecified: Secondary | ICD-10-CM | POA: Diagnosis not present

## 2021-07-01 DIAGNOSIS — F17200 Nicotine dependence, unspecified, uncomplicated: Secondary | ICD-10-CM | POA: Diagnosis not present

## 2021-07-01 DIAGNOSIS — R011 Cardiac murmur, unspecified: Secondary | ICD-10-CM | POA: Diagnosis not present

## 2021-07-01 DIAGNOSIS — I2510 Atherosclerotic heart disease of native coronary artery without angina pectoris: Secondary | ICD-10-CM | POA: Diagnosis not present

## 2021-07-01 DIAGNOSIS — I208 Other forms of angina pectoris: Secondary | ICD-10-CM | POA: Diagnosis not present

## 2021-07-01 DIAGNOSIS — K219 Gastro-esophageal reflux disease without esophagitis: Secondary | ICD-10-CM | POA: Diagnosis not present

## 2021-07-02 ENCOUNTER — Other Ambulatory Visit (INDEPENDENT_AMBULATORY_CARE_PROVIDER_SITE_OTHER): Payer: Medicare Other

## 2021-07-02 ENCOUNTER — Other Ambulatory Visit: Payer: Self-pay

## 2021-07-02 DIAGNOSIS — E7800 Pure hypercholesterolemia, unspecified: Secondary | ICD-10-CM

## 2021-07-02 DIAGNOSIS — E039 Hypothyroidism, unspecified: Secondary | ICD-10-CM | POA: Diagnosis not present

## 2021-07-02 DIAGNOSIS — E538 Deficiency of other specified B group vitamins: Secondary | ICD-10-CM | POA: Diagnosis not present

## 2021-07-02 LAB — CBC WITH DIFFERENTIAL/PLATELET
Basophils Absolute: 0.1 10*3/uL (ref 0.0–0.1)
Basophils Relative: 0.7 % (ref 0.0–3.0)
Eosinophils Absolute: 0.1 10*3/uL (ref 0.0–0.7)
Eosinophils Relative: 1.4 % (ref 0.0–5.0)
HCT: 38.8 % (ref 36.0–46.0)
Hemoglobin: 13 g/dL (ref 12.0–15.0)
Lymphocytes Relative: 26.6 % (ref 12.0–46.0)
Lymphs Abs: 2.6 10*3/uL (ref 0.7–4.0)
MCHC: 33.6 g/dL (ref 30.0–36.0)
MCV: 99.9 fl (ref 78.0–100.0)
Monocytes Absolute: 0.7 10*3/uL (ref 0.1–1.0)
Monocytes Relative: 7.2 % (ref 3.0–12.0)
Neutro Abs: 6.3 10*3/uL (ref 1.4–7.7)
Neutrophils Relative %: 64.1 % (ref 43.0–77.0)
Platelets: 253 10*3/uL (ref 150.0–400.0)
RBC: 3.88 Mil/uL (ref 3.87–5.11)
RDW: 13.1 % (ref 11.5–15.5)
WBC: 9.9 10*3/uL (ref 4.0–10.5)

## 2021-07-02 LAB — LIPID PANEL
Cholesterol: 172 mg/dL (ref 0–200)
HDL: 74 mg/dL (ref >39.00)
LDL Cholesterol: 83 mg/dL (ref 0–99)
NonHDL: 98.2
Total CHOL/HDL Ratio: 2
Triglycerides: 75 mg/dL (ref 0.0–149.0)
VLDL: 15 mg/dL (ref 0.0–40.0)

## 2021-07-02 LAB — BASIC METABOLIC PANEL
BUN: 9 mg/dL (ref 6–23)
CO2: 30 mEq/L (ref 19–32)
Calcium: 9.3 mg/dL (ref 8.4–10.5)
Chloride: 103 mEq/L (ref 96–112)
Creatinine, Ser: 0.76 mg/dL (ref 0.40–1.20)
GFR: 79.6 mL/min (ref >60.00)
Glucose, Bld: 86 mg/dL (ref 70–99)
Potassium: 4.8 mEq/L (ref 3.5–5.1)
Sodium: 138 mEq/L (ref 135–145)

## 2021-07-02 LAB — HEPATIC FUNCTION PANEL
ALT: 24 U/L (ref 0–35)
AST: 24 U/L (ref 0–37)
Albumin: 4.3 g/dL (ref 3.5–5.2)
Alkaline Phosphatase: 52 U/L (ref 39–117)
Bilirubin, Direct: 0.1 mg/dL (ref 0.0–0.3)
Total Bilirubin: 0.5 mg/dL (ref 0.2–1.2)
Total Protein: 6.5 g/dL (ref 6.0–8.3)

## 2021-07-02 LAB — VITAMIN B12: Vitamin B-12: 886 pg/mL (ref 211–911)

## 2021-07-02 LAB — TSH: TSH: 12.27 u[IU]/mL — ABNORMAL HIGH (ref 0.35–5.50)

## 2021-07-04 ENCOUNTER — Ambulatory Visit (INDEPENDENT_AMBULATORY_CARE_PROVIDER_SITE_OTHER): Payer: Medicare Other | Admitting: Internal Medicine

## 2021-07-04 ENCOUNTER — Other Ambulatory Visit: Payer: Self-pay

## 2021-07-04 VITALS — BP 136/60 | HR 67 | Temp 97.8°F | Ht 60.0 in | Wt 117.0 lb

## 2021-07-04 DIAGNOSIS — N281 Cyst of kidney, acquired: Secondary | ICD-10-CM | POA: Diagnosis not present

## 2021-07-04 DIAGNOSIS — F439 Reaction to severe stress, unspecified: Secondary | ICD-10-CM

## 2021-07-04 DIAGNOSIS — I700 Atherosclerosis of aorta: Secondary | ICD-10-CM

## 2021-07-04 DIAGNOSIS — I2510 Atherosclerotic heart disease of native coronary artery without angina pectoris: Secondary | ICD-10-CM

## 2021-07-04 DIAGNOSIS — E7800 Pure hypercholesterolemia, unspecified: Secondary | ICD-10-CM

## 2021-07-04 DIAGNOSIS — J432 Centrilobular emphysema: Secondary | ICD-10-CM

## 2021-07-04 DIAGNOSIS — E039 Hypothyroidism, unspecified: Secondary | ICD-10-CM

## 2021-07-04 DIAGNOSIS — Z720 Tobacco use: Secondary | ICD-10-CM

## 2021-07-04 MED ORDER — LEVOTHYROXINE SODIUM 100 MCG PO TABS
100.0000 ug | ORAL_TABLET | Freq: Every day | ORAL | 2 refills | Status: DC
Start: 2021-07-04 — End: 2021-10-02

## 2021-07-04 NOTE — Progress Notes (Signed)
Pre visit review using our clinic review tool, if applicable. No additional management support is needed unless otherwise documented below in the visit note. 

## 2021-07-04 NOTE — Progress Notes (Signed)
Patient ID: Leah Benitez, female   DOB: [birth year], 70 y.o.   MRN: [id number]   Subjective:    Patient ID: Leah Benitez, female    DOB: 29-Dec-1950, 70 y.o.   MRN: [id number]  This visit occurred during the SARS-CoV-2 public health emergency.  Safety protocols were in place, including screening questions prior to the visit, additional usage of staff PPE, and extensive cleaning of exam room while observing appropriate contact time as indicated for disinfecting solutions.   Patient here for a scheduled follow up.   Chief Complaint  Patient presents with   Follow-up   .   HPI Here to follow up regarding her blood pressure and cholesterol.  Also increased stress.  Step daughter - passed.  Discussed.  Has good support.  Does not feel needs any further intervention at this time.  On zoloft.  No chest pain.  Saw Dr Clayborn Bigness recently.  Planning for echo and stress echo 07/23/21.  Breathing stable.  No increased cough or congestion.  No acid reflux.  No abdominal pain.  Bowels moving.     Past Medical History:  Diagnosis Date   Allergy    Hyperlipidemia    Thyroid disease    Past Surgical History:  Procedure Laterality Date   ABDOMINAL HYSTERECTOMY     previous abnormal pap smear   BREAST BIOPSY     TONSILLECTOMY     Family History  Problem Relation Age of Onset   Arthritis Mother    Stroke Mother    Hypertension Mother    Arthritis Father    Heart disease Father    Heart disease Brother    Cancer Maternal Aunt        breast   Social History   Socioeconomic History   Marital status: Married    Spouse name: Not on file   Number of children: Not on file   Years of education: Not on file   Highest education level: Not on file  Occupational History   Not on file  Tobacco Use   Smoking status: Every Day    Packs/day: 1.00    Years: 47.00    Pack years: 47.00    Types: Cigarettes   Smokeless tobacco: Never  Substance and Sexual Activity   Alcohol use: Yes    Alcohol/week: 7.0  standard drinks    Types: 7 Glasses of wine per week    Comment: 1 glass of wine per night   Drug use: Not on file   Sexual activity: Not on file  Other Topics Concern   Not on file  Social History Narrative   Not on file   Social Determinants of Health   Financial Resource Strain: Low Risk    Difficulty of Paying Living Expenses: Not hard at all  Food Insecurity: No Food Insecurity   Worried About Charity fundraiser in the Last Year: Never true   Ran Out of Food in the Last Year: Never true  Transportation Needs: No Transportation Needs   Lack of Transportation (Medical): No   Lack of Transportation (Non-Medical): No  Physical Activity: Not on file  Stress: No Stress Concern Present   Feeling of Stress : Not at all  Social Connections: Unknown   Frequency of Communication with Friends and Family: More than three times a week   Frequency of Social Gatherings with Friends and Family: More than three times a week   Attends Religious Services: Not on file   Active Member of Clubs or Organizations:  Not on file   Attends Club or Organization Meetings: Not on file   Marital Status: Married     Review of Systems  Constitutional:  Negative for appetite change and unexpected weight change.  HENT:  Negative for congestion and sinus pressure.   Respiratory:  Negative for cough, chest tightness and shortness of breath.   Cardiovascular:  Negative for chest pain, palpitations and leg swelling.  Gastrointestinal:  Negative for abdominal pain, diarrhea, nausea and vomiting.  Genitourinary:  Negative for difficulty urinating and dysuria.  Musculoskeletal:  Negative for joint swelling and myalgias.  Skin:  Negative for color change and rash.  Neurological:  Negative for dizziness, light-headedness and headaches.  Psychiatric/Behavioral:  Negative for agitation and dysphoric mood.       Objective:     BP 136/60   Pulse 67   Temp 97.8 F (36.6 C) (Skin)   Ht 5' (1.524 m)   Wt 117  lb (53.1 kg)   SpO2 96%   BMI 22.85 kg/m  Wt Readings from Last 3 Encounters:  07/04/21 117 lb (53.1 kg)  03/01/21 115 lb 9.6 oz (52.4 kg)  01/25/21 117 lb (53.1 kg)    Physical Exam Vitals reviewed.  Constitutional:      General: She is not in acute distress.    Appearance: Normal appearance.  HENT:     Head: Normocephalic and atraumatic.     Right Ear: External ear normal.     Left Ear: External ear normal.  Eyes:     General: No scleral icterus.       Right eye: No discharge.        Left eye: No discharge.     Conjunctiva/sclera: Conjunctivae normal.  Neck:     Thyroid: No thyromegaly.  Cardiovascular:     Rate and Rhythm: Normal rate and regular rhythm.  Pulmonary:     Effort: No respiratory distress.     Breath sounds: Normal breath sounds. No wheezing.  Abdominal:     General: Bowel sounds are normal.     Palpations: Abdomen is soft.     Tenderness: There is no abdominal tenderness.  Musculoskeletal:        General: No swelling or tenderness.     Cervical back: Neck supple. No tenderness.  Lymphadenopathy:     Cervical: No cervical adenopathy.  Skin:    Findings: No erythema or rash.  Neurological:     Mental Status: She is alert.  Psychiatric:        Mood and Affect: Mood normal.        Behavior: Behavior normal.     Outpatient Encounter Medications as of 07/04/2021  Medication Sig   aspirin EC 81 MG tablet Take 1 tablet (81 mg total) by mouth daily. Swallow whole.   atorvastatin (LIPITOR) 20 MG tablet TAKE 1 TABLET BY MOUTH AT BEDTIME FOR CHOLESTEROL   cholecalciferol (VITAMIN D) 1000 units tablet Take 2,000 Units by mouth daily.   Cyanocobalamin (VITAMIN B12) 1000 MCG TBCR    Esomeprazole Magnesium (NEXIUM PO) Take by mouth.   fluticasone (FLONASE) 50 MCG/ACT nasal spray Place 2 sprays into both nostrils daily.   levothyroxine (SYNTHROID) 100 MCG tablet Take 1 tablet (100 mcg total) by mouth daily before breakfast.   sertraline (ZOLOFT) 25 MG tablet  TAKE (1) OR (2) TABLETS BY MOUTH DAILY.   verapamil (CALAN-SR) 120 MG CR tablet Take 120 mg by mouth daily.   [DISCONTINUED] levothyroxine (SYNTHROID) 88 MCG tablet TAKE 1 TABLET IN  THE MORNING FOR THYROID   No facility-administered encounter medications on file as of 07/04/2021.     Lab Results  Component Value Date   WBC 9.9 07/02/2021   HGB 13.0 07/02/2021   HCT 38.8 07/02/2021   PLT 253.0 07/02/2021   GLUCOSE 86 07/02/2021   CHOL 172 07/02/2021   TRIG 75.0 07/02/2021   HDL 74.00 07/02/2021   LDLCALC 83 07/02/2021   ALT 24 07/02/2021   AST 24 07/02/2021   NA 138 07/02/2021   K 4.8 07/02/2021   CL 103 07/02/2021   CREATININE 0.76 07/02/2021   BUN 9 07/02/2021   CO2 30 07/02/2021   TSH 12.27 (H) 07/02/2021    CT CHEST LUNG CANCER SCREENING LOW DOSE WO CONTRAST  Result Date: 01/28/2021 CLINICAL DATA:  70 year old female current smoker with 48 pack-year history of smoking. Lung cancer screening examination. EXAM: CT CHEST WITHOUT CONTRAST LOW-DOSE FOR LUNG CANCER SCREENING TECHNIQUE: Multidetector CT imaging of the chest was performed following the standard protocol without IV contrast. COMPARISON:  Low-dose lung cancer screening chest CT 01/24/2020. FINDINGS: Cardiovascular: Heart size is normal. There is no significant pericardial fluid, thickening or pericardial calcification. There is aortic atherosclerosis, as well as atherosclerosis of the great vessels of the mediastinum and the coronary arteries, including calcified atherosclerotic plaque in the left main, left anterior descending, left circumflex and right coronary arteries. Mild calcifications of the aortic valve. Mediastinum/Nodes: No pathologically enlarged mediastinal or hilar lymph nodes. Please note that accurate exclusion of hilar adenopathy is limited on noncontrast CT scans. Esophagus is unremarkable in appearance. No axillary lymphadenopathy. Lungs/Pleura: Multiple small pulmonary nodules are again noted throughout the  lungs bilaterally, largest of which is in the medial aspect of the right upper lobe (axial image 45 of series 3), with a volume derived mean diameter of 4.2 mm. No other larger more suspicious appearing pulmonary nodules or masses are noted. No acute consolidative airspace disease. No pleural effusions. Diffuse bronchial wall thickening with mild centrilobular and paraseptal emphysema. Upper Abdomen: Aortic atherosclerosis. Musculoskeletal: There are no aggressive appearing lytic or blastic lesions noted in the visualized portions of the skeleton. IMPRESSION: 1. Lung-RADS 2S, benign appearance or behavior. Continue annual screening with low-dose chest CT without contrast in 12 months. 2. The "S" modifier above refers to potentially clinically significant non lung cancer related findings. Specifically, there is aortic atherosclerosis, in addition to left main and 3 vessel coronary artery disease. Please note that although the presence of coronary artery calcium documents the presence of coronary artery disease, the severity of this disease and any potential stenosis cannot be assessed on this non-gated CT examination. Assessment for potential risk factor modification, dietary therapy or pharmacologic therapy may be warranted, if clinically indicated. 3. Mild diffuse bronchial wall thickening with mild centrilobular and paraseptal emphysema; imaging findings suggestive of underlying COPD. 4. There are calcifications of the aortic valve. Echocardiographic correlation for evaluation of potential valvular dysfunction may be warranted if clinically indicated. Aortic Atherosclerosis (ICD10-I70.0) and Emphysema (ICD10-J43.9). Electronically Signed   By: Vinnie Langton M.D.   On: 01/28/2021 08:21       Assessment & Plan:   Problem List Items Addressed This Visit     Aortic atherosclerosis ([place])    Recent CT chest - calcification in aorta and Left main and 3 vessel disease. Continue lipitor. Hold on increasing  dose.        CAD (coronary artery disease)    Recent CT chest - calcification in aorta and Left main  and 3 vessel disease.  Discussed.   Discussed risk factor modification. Discussed the need to quit smoking.  Family history.  Saw cardiology.  Planning for echo and stress echo 07/23/21.        Emphysema lung ([facility])    Breathing stable.  Discussed quitting smoking.  Will notify me if decides wants to quit.       Hypercholesterolemia    Continue lipitor.  Low cholesterol diet and exercise.  Follow lipid panel and liver function tests.        Hypothyroidism - Primary    Recent TSH elevated.  Increase synthroid to 1102mcg q day.  Follow tsh.        Relevant Medications   levothyroxine (SYNTHROID) 100 MCG tablet   Other Relevant Orders   TSH   Renal cyst    Followed by urology.  Recommended f/u ultrasound in one year.       Stress    Increased stress as outlined.  On zoloft.  Does not feel needs any further intervention at this time.  Follow.       Tobacco abuse    Declines to quit at this time.  Follow.         Einar Pheasant, MD

## 2021-07-07 ENCOUNTER — Encounter: Payer: Self-pay | Admitting: Internal Medicine

## 2021-07-07 NOTE — Assessment & Plan Note (Signed)
Increased stress as outlined.  On zoloft.  Does not feel needs any further intervention at this time.  Follow.

## 2021-07-07 NOTE — Assessment & Plan Note (Signed)
Recent TSH elevated.  Increase synthroid to 140mcg q day.  Follow tsh.

## 2021-07-07 NOTE — Assessment & Plan Note (Signed)
Declines to quit at this time.  Follow.

## 2021-07-07 NOTE — Assessment & Plan Note (Signed)
Recent CT chest - calcification in aorta and Left main and 3 vessel disease.  Discussed.   Discussed risk factor modification. Discussed the need to quit smoking.  Family history.  Saw cardiology.  Planning for echo and stress echo 07/23/21.

## 2021-07-07 NOTE — Assessment & Plan Note (Signed)
Continue lipitor.  Low cholesterol diet and exercise.  Follow lipid panel and liver function tests.   

## 2021-07-07 NOTE — Assessment & Plan Note (Signed)
Recent CT chest - calcification in aorta and Left main and 3 vessel disease. Continue lipitor. Hold on increasing dose.

## 2021-07-07 NOTE — Assessment & Plan Note (Signed)
Breathing stable.  Discussed quitting smoking.  Will notify me if decides wants to quit.

## 2021-07-07 NOTE — Assessment & Plan Note (Signed)
Followed by urology.  Recommended f/u ultrasound in one year.

## 2021-07-23 DIAGNOSIS — I208 Other forms of angina pectoris: Secondary | ICD-10-CM | POA: Diagnosis not present

## 2021-07-23 DIAGNOSIS — R011 Cardiac murmur, unspecified: Secondary | ICD-10-CM | POA: Diagnosis not present

## 2021-07-31 DIAGNOSIS — I208 Other forms of angina pectoris: Secondary | ICD-10-CM | POA: Diagnosis not present

## 2021-07-31 DIAGNOSIS — G44011 Episodic cluster headache, intractable: Secondary | ICD-10-CM | POA: Diagnosis not present

## 2021-07-31 DIAGNOSIS — K219 Gastro-esophageal reflux disease without esophagitis: Secondary | ICD-10-CM | POA: Diagnosis not present

## 2021-07-31 DIAGNOSIS — I2510 Atherosclerotic heart disease of native coronary artery without angina pectoris: Secondary | ICD-10-CM | POA: Diagnosis not present

## 2021-07-31 DIAGNOSIS — R001 Bradycardia, unspecified: Secondary | ICD-10-CM | POA: Diagnosis not present

## 2021-07-31 DIAGNOSIS — R011 Cardiac murmur, unspecified: Secondary | ICD-10-CM | POA: Diagnosis not present

## 2021-07-31 DIAGNOSIS — R0609 Other forms of dyspnea: Secondary | ICD-10-CM | POA: Diagnosis not present

## 2021-07-31 DIAGNOSIS — F17200 Nicotine dependence, unspecified, uncomplicated: Secondary | ICD-10-CM | POA: Diagnosis not present

## 2021-07-31 DIAGNOSIS — Z23 Encounter for immunization: Secondary | ICD-10-CM | POA: Diagnosis not present

## 2021-08-02 ENCOUNTER — Other Ambulatory Visit: Payer: Self-pay

## 2021-08-02 ENCOUNTER — Other Ambulatory Visit (INDEPENDENT_AMBULATORY_CARE_PROVIDER_SITE_OTHER): Payer: Medicare Other

## 2021-08-02 DIAGNOSIS — E039 Hypothyroidism, unspecified: Secondary | ICD-10-CM | POA: Diagnosis not present

## 2021-08-02 LAB — TSH: TSH: 4.09 mIU/L (ref 0.40–4.50)

## 2021-08-02 NOTE — Addendum Note (Signed)
Addended by: Neta Ehlers on: 08/02/2021 02:17 PM   Modules accepted: Orders

## 2021-08-08 DIAGNOSIS — Z23 Encounter for immunization: Secondary | ICD-10-CM | POA: Diagnosis not present

## 2021-08-19 ENCOUNTER — Other Ambulatory Visit: Payer: Self-pay

## 2021-08-19 ENCOUNTER — Ambulatory Visit (HOSPITAL_COMMUNITY)
Admission: RE | Admit: 2021-08-19 | Discharge: 2021-08-19 | Disposition: A | Discharge status: Home / Self Care | Payer: Medicare Other | Source: Ambulatory Visit | Attending: Urology | Admitting: Urology

## 2021-08-19 DIAGNOSIS — N281 Cyst of kidney, acquired: Secondary | ICD-10-CM | POA: Diagnosis not present

## 2021-08-23 ENCOUNTER — Other Ambulatory Visit (HOSPITAL_COMMUNITY): Payer: Medicare Other

## 2021-08-23 ENCOUNTER — Encounter: Payer: Self-pay | Admitting: Urology

## 2021-08-23 ENCOUNTER — Ambulatory Visit (INDEPENDENT_AMBULATORY_CARE_PROVIDER_SITE_OTHER): Payer: Medicare Other | Admitting: Urology

## 2021-08-23 ENCOUNTER — Other Ambulatory Visit: Payer: Self-pay

## 2021-08-23 VITALS — BP 130/72 | HR 76 | Ht 60.0 in | Wt 119.0 lb

## 2021-08-23 DIAGNOSIS — I2510 Atherosclerotic heart disease of native coronary artery without angina pectoris: Secondary | ICD-10-CM

## 2021-08-23 DIAGNOSIS — N281 Cyst of kidney, acquired: Secondary | ICD-10-CM

## 2021-08-23 DIAGNOSIS — N3281 Overactive bladder: Secondary | ICD-10-CM

## 2021-08-23 NOTE — Progress Notes (Signed)
08/23/2021 1:25 PM   Leah Benitez January 19, 1951 [id number]  Referring provider: Einar Pheasant, [address] [place],  [place] [id number]  Chief Complaint  Patient presents with   Follow-up    1 year with renal ultrasound    HPI: 70 year old female with bilateral renal cyst he returns today for routine surveillance of these.  Please see previous notes for details.  Initially, there was concern for bilateral renal masses on renal ultrasound from 01/2020.  Ultimately, on CT scan, these were felt to be more consistent with complex cysts, [place] II.  Follow-up renal ultrasound today shows small renal cysts bilaterally, essentially stable.  She denies any other urinary issues other than occasional frequency.  No flank pain.  No gross hematuria.     PMH: Past Medical History:  Diagnosis Date   Allergy    Hyperlipidemia    Thyroid disease     Surgical History: Past Surgical History:  Procedure Laterality Date   ABDOMINAL HYSTERECTOMY     previous abnormal pap smear   BREAST BIOPSY     TONSILLECTOMY      Home Medications:  Allergies as of 08/23/2021       Reactions   Actonel [risedronate Sodium] Nausea And Vomiting   Penicillins Swelling        Medication List        Accurate as of August 23, 2021  1:25 PM. If you have any questions, ask your nurse or doctor.          aspirin EC 81 MG tablet Take 1 tablet (81 mg total) by mouth daily. Swallow whole.   atorvastatin 20 MG tablet Commonly known as: LIPITOR TAKE 1 TABLET BY MOUTH AT BEDTIME FOR CHOLESTEROL   cholecalciferol 1000 units tablet Commonly known as: VITAMIN D Take 2,000 Units by mouth daily.   fluticasone 50 MCG/ACT nasal spray Commonly known as: FLONASE Place 2 sprays into both nostrils daily.   levothyroxine 100 MCG tablet Commonly known as: Synthroid Take 1 tablet (100 mcg total) by mouth daily before breakfast.   NEXIUM PO Take by mouth.   sertraline 25 MG  tablet Commonly known as: ZOLOFT TAKE (1) OR (2) TABLETS BY MOUTH DAILY.   verapamil 120 MG CR tablet Commonly known as: CALAN-SR Take 120 mg by mouth daily.   Vitamin B12 1000 MCG Tbcr        Allergies:  Allergies  Allergen Reactions   Actonel [Risedronate Sodium] Nausea And Vomiting   Penicillins Swelling    Family History: Family History  Problem Relation Age of Onset   Arthritis Mother    Stroke Mother    Hypertension Mother    Arthritis Father    Heart disease Father    Heart disease Brother    Cancer Maternal Aunt        breast    Social History:  reports that she has been smoking cigarettes. She has a 47.00 pack-year smoking history. She has never used smokeless tobacco. She reports current alcohol use of about 7.0 standard drinks per week. No history on file for drug use.   Physical Exam: BP 130/72   Pulse 76   Ht 5' (1.524 m)   Wt 119 lb (54 kg)   BMI 23.24 kg/m   Constitutional:  Alert and oriented, No acute distress. HEENT: [place] AT, moist mucus membranes.  Trachea midline, no masses. Cardiovascular: No clubbing, cyanosis, or edema. Respiratory: Normal respiratory effort, no increased work of breathing. Skin: No rashes, bruises or  suspicious lesions. Neurologic: Grossly intact, no focal deficits, moving all 4 extremities. Psychiatric: Normal mood and affect.  Laboratory Data: Lab Results  Component Value Date   WBC 9.9 07/02/2021   HGB 13.0 07/02/2021   HCT 38.8 07/02/2021   MCV 99.9 07/02/2021   PLT 253.0 07/02/2021    Lab Results  Component Value Date   CREATININE 0.76 07/02/2021    Pertinent Imaging: US RENAL  Narrative CLINICAL DATA:  Renal cyst  EXAM: RENAL / URINARY TRACT ULTRASOUND COMPLETE  COMPARISON:  Renal ultrasound 08/17/2020  FINDINGS: Right Kidney:  Renal measurements: 8.9 x 4.1 x 4.6 cm = volume: 87.3 mL. Echogenicity within normal limits. No suspicious mass or hydronephrosis visualized. A few anechoic cysts  measuring up to 1.1 cm laterally.  Left Kidney:  Renal measurements: 10 x 4.3 x 4.6 cm = volume: 104.5 mL. Echogenicity within normal limits. No suspicious mass or hydronephrosis visualized. A few anechoic cysts identified measuring up to 0.9 cm in the midpole.  Bladder:  Appears normal for degree of bladder distention.  Other:  None.  IMPRESSION: Small bilateral renal cysts.  No hydronephrosis.   Electronically Signed By: Ofilia Neas M.D. On: 08/20/2021 15:20  Renal ultrasound was personally reviewed.  Is compared to the renal ultrasound from last year which essentially stable.  Assessment & Plan:    1. Renal cyst Bilateral small renal cyst, essentially stable.  No greater than Bosniak 2.  At this point, reasonably reassured that these are clinically insignificant renal cyst and likely do not require any further surveillance.  She would like to repeat renal ultrasound 1 more time in 2 years to ensure no change.  That time if there is no change, would abstain from further surveillance of these as are likely benign.  She is agreeable this plan.  2. OAB (overactive bladder) Stable, not interested in medication.  F/u 2 years with Muniz, MD  [facility] [address] [place], [place] [address] [phone]

## 2021-09-10 ENCOUNTER — Other Ambulatory Visit: Payer: Self-pay | Admitting: Internal Medicine

## 2021-10-02 ENCOUNTER — Other Ambulatory Visit: Payer: Self-pay | Admitting: Internal Medicine

## 2021-10-11 ENCOUNTER — Telehealth: Payer: Self-pay | Admitting: Internal Medicine

## 2021-10-11 NOTE — Telephone Encounter (Signed)
Copied from [place] [id number]. Topic: Medicare AWV >> Oct 11, 2021 10:07 AM Harris-Coley, Hannah Beat wrote: Reason for CRM - LVM 10/11/21 @10 :06a of appt change date to 10/25/21@12 :30p khc.  Please confirm appt change w/patient

## 2021-10-17 ENCOUNTER — Ambulatory Visit: Payer: Medicare Other

## 2021-10-25 ENCOUNTER — Ambulatory Visit (INDEPENDENT_AMBULATORY_CARE_PROVIDER_SITE_OTHER): Payer: Medicare Other

## 2021-10-25 VITALS — Ht 60.0 in | Wt 119.0 lb

## 2021-10-25 DIAGNOSIS — Z0000 Encounter for general adult medical examination without abnormal findings: Secondary | ICD-10-CM

## 2021-10-25 NOTE — Progress Notes (Signed)
Subjective:   Leah Benitez is a 71 y.o. female who presents for Medicare Annual (Subsequent) preventive examination.  Review of Systems    No ROS.  Medicare Wellness Virtual Visit.  Visual/audio telehealth visit, UTA vital signs.   See social history for additional risk factors.   Cardiac Risk Factors include: advanced age (>30men, >76 women)     Objective:    Today's Vitals   10/25/21 1231  Weight: 119 lb (54 kg)  Height: 5' (1.524 m)   Body mass index is 23.24 kg/m.  Advanced Directives 10/25/2021 10/16/2020 02/02/2019  Does Patient Have a Medical Advance Directive? No No No  Would patient like information on creating a medical advance directive? No - Patient declined No - Patient declined Yes (MAU/Ambulatory/Procedural Areas - Information given)    Current Medications (verified) Outpatient Encounter Medications as of 10/25/2021  Medication Sig   aspirin EC 81 MG tablet Take 1 tablet (81 mg total) by mouth daily. Swallow whole.   atorvastatin (LIPITOR) 20 MG tablet TAKE 1 TABLET BY MOUTH AT BEDTIME FOR CHOLESTEROL   cholecalciferol (VITAMIN D) 1000 units tablet Take 2,000 Units by mouth daily.   Cyanocobalamin (VITAMIN B12) 1000 MCG TBCR    Esomeprazole Magnesium (NEXIUM PO) Take by mouth.   fluticasone (FLONASE) 50 MCG/ACT nasal spray Place 2 sprays into both nostrils daily.   levothyroxine (SYNTHROID) 100 MCG tablet TAKE 1 TABLET IN THE MORNING BEFORE BREAKFAST   sertraline (ZOLOFT) 25 MG tablet TAKE (1) OR (2) TABLETS BY MOUTH DAILY.   verapamil (CALAN-SR) 120 MG CR tablet Take 120 mg by mouth daily.   No facility-administered encounter medications on file as of 10/25/2021.    Allergies (verified) Actonel [risedronate sodium] and Penicillins   History: Past Medical History:  Diagnosis Date   Allergy    Hyperlipidemia    Thyroid disease    Past Surgical History:  Procedure Laterality Date   ABDOMINAL HYSTERECTOMY     previous abnormal pap smear   BREAST BIOPSY      TONSILLECTOMY     Family History  Problem Relation Age of Onset   Arthritis Mother    Stroke Mother    Hypertension Mother    Arthritis Father    Heart disease Father    Heart disease Brother    Cancer Maternal Aunt        breast   Social History   Socioeconomic History   Marital status: Married    Spouse name: Not on file   Number of children: Not on file   Years of education: Not on file   Highest education level: Not on file  Occupational History   Not on file  Tobacco Use   Smoking status: Every Day    Packs/day: 1.00    Years: 47.00    Pack years: 47.00    Types: Cigarettes   Smokeless tobacco: Never  Substance and Sexual Activity   Alcohol use: Yes    Alcohol/week: 7.0 standard drinks    Types: 7 Glasses of wine per week    Comment: 1 glass of wine per night   Drug use: Not on file   Sexual activity: Not on file  Other Topics Concern   Not on file  Social History Narrative   Not on file   Social Determinants of Health   Financial Resource Strain: Low Risk    Difficulty of Paying Living Expenses: Not hard at all  Food Insecurity: No Food Insecurity   Worried About Running Out  of Food in the Last Year: Never true   [place] in the Last Year: Never true  Transportation Needs: No Transportation Needs   Lack of Transportation (Medical): No   Lack of Transportation (Non-Medical): No  Physical Activity: Not on file  Stress: No Stress Concern Present   Feeling of Stress : Only a little  Social Connections: Unknown   Frequency of Communication with Friends and Family: More than three times a week   Frequency of Social Gatherings with Friends and Family: More than three times a week   Attends Religious Services: Not on Electrical engineer or Organizations: Not on file   Attends Archivist Meetings: Not on file   Marital Status: Married    Tobacco Counseling Ready to quit: Not Answered Counseling given: Not  Answered   Clinical Intake:  Pre-visit preparation completed: Yes        Diabetes: No  How often do you need to have someone help you when you read instructions, pamphlets, or other written materials from your doctor or pharmacy?: 1 - Never    Interpreter Needed?: No      Activities of Daily Living In your present state of health, do you have any difficulty performing the following activities: 10/25/2021  Hearing? N  Vision? N  Difficulty concentrating or making decisions? N  Walking or climbing stairs? N  Dressing or bathing? N  Doing errands, shopping? N  Preparing Food and eating ? N  Using the Toilet? N  In the past six months, have you accidently leaked urine? N  Do you have problems with loss of bowel control? N  Managing your Medications? N  Managing your Finances? N  Housekeeping or managing your Housekeeping? N  Some recent data might be hidden    Patient Care Team: Einar Pheasant, MD as PCP - General (Internal Medicine)  Indicate any recent Medical Services you may have received from other than [facility] providers in the past year (date may be approximate).     Assessment:   This is a routine wellness examination for Leah Benitez.  Virtual Visit via Telephone Note  I connected with  Kambrie Schweikert on 10/25/21 at 12:30 PM EST by telephone and verified that I am speaking with the correct person using two identifiers.  Persons participating in the virtual visit: patient/Nurse Health Advisor   I discussed the limitations, risks, security and privacy concerns of performing an evaluation and management service by telephone and the availability of in person appointments. The patient expressed understanding and agreed to proceed.  Interactive audio and video telecommunications were attempted between this nurse and patient, however failed, due to patient having technical difficulties OR patient did not have access to video capability.  We continued and completed visit with  audio only.  Some vital signs may be absent or patient reported.   Hearing/Vision screen Hearing Screening - Comments:: Patient is able to hear conversational tones without difficulty.  No issues reported. Vision Screening - Comments:: Followed by [facility] corrective lenses  She has regular follow up with her ophthalmologist  Dietary issues and exercise activities discussed: Current Exercise Habits: Home exercise routine, Intensity: Mild   Goals Addressed               This Visit's Progress     Patient Stated     Increase physical activity (pt-stated)        Walk for exercise       Depression  Screen PHQ 2/9 Scores 10/25/2021 07/04/2021 10/16/2020 04/05/2020 02/02/2019 08/28/2017 08/21/2016  PHQ - 2 Score 0 0 0 0 0 0 0  PHQ- 9 Score - - - - - 0 -    Fall Risk Fall Risk  10/25/2021 07/04/2021 10/16/2020 04/05/2020 02/02/2019  Falls in the past year? 0 0 0 0 0  Number falls in past yr: 0 0 0 - -  Injury with Fall? - 0 0 - -  Risk for fall due to : - - - - -  Follow up Falls evaluation completed Falls evaluation completed Falls evaluation completed Falls evaluation completed -    FALL RISK PREVENTION PERTAINING TO THE HOME: Home free of loose throw rugs in walkways, pet beds, electrical cords, etc? Yes  Adequate lighting in your home to reduce risk of falls? Yes   ASSISTIVE DEVICES UTILIZED TO PREVENT FALLS: Use of a cane, walker or w/c? No   TIMED UP AND GO: Was the test performed? No .   Cognitive Function:  Patient is alert and oriented x3.    6CIT Screen 10/16/2020 02/02/2019  What Year? 0 points 0 points  What month? 0 points 0 points  What time? - 0 points  Count back from 20 - 0 points  Months in reverse 0 points 0 points  Repeat phrase 0 points 0 points  Total Score - 0   Immunizations Immunization History  Administered Date(s) Administered   Influenza,inj,quad, With Preservative 07/27/2019   Influenza-Unspecified 07/30/2018, 07/24/2020   Moderna  Sars-Covid-2 Vaccination 02/21/2020, 03/23/2020, 09/11/2020   Pneumococcal Conjugate-13 10/31/2016   Pneumococcal Polysaccharide-23 08/16/2019   Tdap 01/19/2017   Shingrix Completed?: No.    Education has been provided regarding the importance of this vaccine. Patient has been advised to call insurance company to determine out of pocket expense if they have not yet received this vaccine. Advised may also receive vaccine at local pharmacy or Health Dept. Verbalized acceptance and understanding.  Screening Tests Health Maintenance  Topic Date Due   COLONOSCOPY (Pts 45-75yrs Insurance coverage will need to be confirmed)  11/04/2021 (Originally 06/16/1996)   COVID-19 Vaccine (4 - Booster for Moderna series) 11/04/2021 (Originally 11/06/2020)   INFLUENZA VACCINE  01/10/2022 (Originally 05/13/2021)   Zoster Vaccines- Shingrix (1 of 2) 01/23/2022 (Originally 06/16/1970)   MAMMOGRAM  04/13/2023   TETANUS/TDAP  01/20/2027   Pneumonia Vaccine 67+ Years old  Completed   DEXA SCAN  Completed   Hepatitis C Screening  Completed   HPV VACCINES  Aged Out   Health Maintenance There are no preventive care reminders to display for this patient.  Colonoscopy- deferred per patient   Bone density- deferred per patient   Lung Cancer Screening: completed 01/28/21.  Vision Screening: Recommended annual ophthalmology exams for early detection of glaucoma and other disorders of the eye.  Dental Screening: Recommended annual dental exams for proper oral hygiene  Community Resource Referral / Chronic Care Management: CRR required this visit?  No   CCM required this visit?  No      Plan:   Keep all routine maintenance appointments.   I have personally reviewed and noted the following in the patients chart:   Medical and social history Use of alcohol, tobacco or illicit drugs  Current medications and supplements including opioid prescriptions. Not taking opioid.  Functional ability and status Nutritional  status Physical activity Advanced directives List of other physicians Hospitalizations, surgeries, and ER visits in previous 12 months Vitals Screenings to include cognitive, depression, and falls Referrals  and appointments  In addition, I have reviewed and discussed with patient certain preventive protocols, quality metrics, and best practice recommendations. A written personalized care plan for preventive services as well as general preventive health recommendations were provided to patient.     Varney Biles, LPN   6/33/3545

## 2021-10-25 NOTE — Patient Instructions (Addendum)
°  Leah Benitez , Thank you for taking time to come for your Medicare Wellness Visit. I appreciate your ongoing commitment to your health goals. Please review the following plan we discussed and let me know if I can assist you in the future.   These are the goals we discussed:  Goals       Patient Stated     Increase physical activity (pt-stated)      Walk for exercise        This is a list of the screening recommended for you and due dates:  Health Maintenance  Topic Date Due   Colon Cancer Screening  11/04/2021*   COVID-19 Vaccine (4 - Booster for Moderna series) 11/04/2021*   Flu Shot  01/10/2022*   Zoster (Shingles) Vaccine (1 of 2) 01/23/2022*   Mammogram  04/13/2023   Tetanus Vaccine  01/20/2027   Pneumonia Vaccine  Completed   DEXA scan (bone density measurement)  Completed   Hepatitis C Screening: USPSTF Recommendation to screen - Ages 21-79 yo.  Completed   HPV Vaccine  Aged Out  *Topic was postponed. The date shown is not the original due date.    Marland Kitchenth

## 2021-11-01 DIAGNOSIS — E538 Deficiency of other specified B group vitamins: Secondary | ICD-10-CM | POA: Diagnosis not present

## 2021-11-01 DIAGNOSIS — G44011 Episodic cluster headache, intractable: Secondary | ICD-10-CM | POA: Diagnosis not present

## 2021-11-01 DIAGNOSIS — E559 Vitamin D deficiency, unspecified: Secondary | ICD-10-CM | POA: Diagnosis not present

## 2021-11-01 NOTE — Telephone Encounter (Signed)
Signing encounter, see previous note 12/28/20 °

## 2021-11-04 ENCOUNTER — Ambulatory Visit (INDEPENDENT_AMBULATORY_CARE_PROVIDER_SITE_OTHER): Payer: Medicare Other | Admitting: Internal Medicine

## 2021-11-04 ENCOUNTER — Other Ambulatory Visit: Payer: Self-pay

## 2021-11-04 VITALS — BP 110/68 | HR 74 | Temp 97.8°F | Resp 16 | Ht 60.0 in | Wt 117.8 lb

## 2021-11-04 DIAGNOSIS — N281 Cyst of kidney, acquired: Secondary | ICD-10-CM

## 2021-11-04 DIAGNOSIS — I2510 Atherosclerotic heart disease of native coronary artery without angina pectoris: Secondary | ICD-10-CM | POA: Diagnosis not present

## 2021-11-04 DIAGNOSIS — I700 Atherosclerosis of aorta: Secondary | ICD-10-CM | POA: Diagnosis not present

## 2021-11-04 DIAGNOSIS — E039 Hypothyroidism, unspecified: Secondary | ICD-10-CM

## 2021-11-04 DIAGNOSIS — G44019 Episodic cluster headache, not intractable: Secondary | ICD-10-CM

## 2021-11-04 DIAGNOSIS — R051 Acute cough: Secondary | ICD-10-CM | POA: Diagnosis not present

## 2021-11-04 DIAGNOSIS — F439 Reaction to severe stress, unspecified: Secondary | ICD-10-CM

## 2021-11-04 DIAGNOSIS — R059 Cough, unspecified: Secondary | ICD-10-CM

## 2021-11-04 DIAGNOSIS — J432 Centrilobular emphysema: Secondary | ICD-10-CM | POA: Diagnosis not present

## 2021-11-04 DIAGNOSIS — Z720 Tobacco use: Secondary | ICD-10-CM

## 2021-11-04 DIAGNOSIS — E7800 Pure hypercholesterolemia, unspecified: Secondary | ICD-10-CM

## 2021-11-04 DIAGNOSIS — Z0000 Encounter for general adult medical examination without abnormal findings: Secondary | ICD-10-CM

## 2021-11-04 MED ORDER — LEVOTHYROXINE SODIUM 100 MCG PO TABS: ORAL_TABLET | ORAL | 5 refills | Status: DC

## 2021-11-04 MED ORDER — AZITHROMYCIN 250 MG PO TABS: ORAL_TABLET | ORAL | 0 refills | Status: AC

## 2021-11-04 MED ORDER — PREDNISONE 10 MG PO TABS: ORAL_TABLET | ORAL | 0 refills | Status: DC

## 2021-11-04 MED ORDER — ALBUTEROL SULFATE HFA 108 (90 BASE) MCG/ACT IN AERS: 2.0000 | INHALATION_SPRAY | Freq: Four times a day (QID) | RESPIRATORY_TRACT | 0 refills | Status: DC | PRN

## 2021-11-04 NOTE — Progress Notes (Signed)
Patient ID: Leah Benitez, female   DOB: Jul 06, 1951, 71 y.o.   MRN: [id number]   Subjective:    Patient ID: Leah Benitez, female    DOB: August 04, 1951, 71 y.o.   MRN: [id number]  This visit occurred during the SARS-CoV-2 public health emergency.  Safety protocols were in place, including screening questions prior to the visit, additional usage of staff PPE, and extensive cleaning of exam room while observing appropriate contact time as indicated for disinfecting solutions.   Chief Complaint  Patient presents with   Arm Pain   Chest Congestion   .   HPI With past history of hypercholesterolemia, CAD and hypothyroidism here to follow up on these issues as well as for a complete physical exam.  Reports that over the last two weeks has had increased cough and wheezing - noticed when lying down.  Chest heavy at times - relates to the congestion.  No chest pain.  Increased drainage and increased mucus.  Sore throat today.  No fever.  No nausea or vomiting.  No diarrhea.  Eating.  Is having some arm/elbow/wrist pain - aggravated by trying to put on compression hose.     Past Medical History:  Diagnosis Date   Allergy    Hyperlipidemia    Thyroid disease    Past Surgical History:  Procedure Laterality Date   ABDOMINAL HYSTERECTOMY     previous abnormal pap smear   BREAST BIOPSY     TONSILLECTOMY     Family History  Problem Relation Age of Onset   Arthritis Mother    Stroke Mother    Hypertension Mother    Arthritis Father    Heart disease Father    Heart disease Brother    Cancer Maternal Aunt        breast   Social History   Socioeconomic History   Marital status: Married    Spouse name: Not on file   Number of children: Not on file   Years of education: Not on file   Highest education level: Not on file  Occupational History   Not on file  Tobacco Use   Smoking status: Every Day    Packs/day: 1.00    Years: 47.00    Pack years: 47.00    Types: Cigarettes   Smokeless tobacco:  Never  Substance and Sexual Activity   Alcohol use: Yes    Alcohol/week: 7.0 standard drinks    Types: 7 Glasses of wine per week    Comment: 1 glass of wine per night   Drug use: Not on file   Sexual activity: Not on file  Other Topics Concern   Not on file  Social History Narrative   Not on file   Social Determinants of Health   Financial Resource Strain: Low Risk    Difficulty of Paying Living Expenses: Not hard at all  Food Insecurity: No Food Insecurity   Worried About Charity fundraiser in the Last Year: Never true   Ran Out of Food in the Last Year: Never true  Transportation Needs: No Transportation Needs   Lack of Transportation (Medical): No   Lack of Transportation (Non-Medical): No  Physical Activity: Not on file  Stress: No Stress Concern Present   Feeling of Stress : Only a little  Social Connections: Unknown   Frequency of Communication with Friends and Family: More than three times a week   Frequency of Social Gatherings with Friends and Family: More than three times a week  Attends Religious Services: Not on file   Active Member of Clubs or Organizations: Not on file   Attends Archivist Meetings: Not on file   Marital Status: Married     Review of Systems  Constitutional:  Negative for appetite change and unexpected weight change.  HENT:  Positive for congestion, postnasal drip and sore throat. Negative for sinus pressure.   Eyes:  Negative for pain and visual disturbance.  Respiratory:  Positive for cough and chest tightness. Negative for shortness of breath.   Cardiovascular:  Negative for chest pain, palpitations and leg swelling.  Gastrointestinal:  Negative for abdominal pain, diarrhea, nausea and vomiting.  Genitourinary:  Negative for difficulty urinating and dysuria.  Musculoskeletal:  Negative for joint swelling.       Arm/elbow and wrist pain as outlined.    Skin:  Negative for color change and rash.  Neurological:  Negative for  dizziness and light-headedness.       History of episodic cluster headaches.    Hematological:  Negative for adenopathy. Does not bruise/bleed easily.  Psychiatric/Behavioral:  Negative for agitation and dysphoric mood.       Objective:     BP 110/68    Pulse 74    Temp 97.8 F (36.6 C)    Resp 16    Ht 5' (1.524 m)    Wt 117 lb 12.8 oz (53.4 kg)    SpO2 97%    BMI 23.01 kg/m  Wt Readings from Last 3 Encounters:  11/04/21 117 lb 12.8 oz (53.4 kg)  10/25/21 119 lb (54 kg)  08/23/21 119 lb (54 kg)    Physical Exam Vitals reviewed.  Constitutional:      General: She is not in acute distress.    Appearance: Normal appearance.  HENT:     Head: Normocephalic and atraumatic.     Right Ear: External ear normal.     Left Ear: External ear normal.  Eyes:     General: No scleral icterus.       Right eye: No discharge.        Left eye: No discharge.     Conjunctiva/sclera: Conjunctivae normal.  Neck:     Thyroid: No thyromegaly.  Cardiovascular:     Rate and Rhythm: Normal rate and regular rhythm.  Pulmonary:     Effort: No respiratory distress.     Comments: Increased cough with forced expiration.  Abdominal:     General: Bowel sounds are normal.     Palpations: Abdomen is soft.     Tenderness: There is no abdominal tenderness.  Musculoskeletal:        General: No swelling or tenderness.     Cervical back: Neck supple. No tenderness.  Lymphadenopathy:     Cervical: No cervical adenopathy.  Skin:    Findings: No erythema or rash.  Neurological:     Mental Status: She is alert.  Psychiatric:        Mood and Affect: Mood normal.        Behavior: Behavior normal.     Outpatient Encounter Medications as of 11/04/2021  Medication Sig   albuterol (VENTOLIN HFA) 108 (90 Base) MCG/ACT inhaler Inhale 2 puffs into the lungs every 6 (six) hours as needed for wheezing or shortness of breath.   [EXPIRED] azithromycin (ZITHROMAX) 250 MG tablet Take 2 tablets on day 1, then 1 tablet  daily on days 2 through 5   predniSONE (DELTASONE) 10 MG tablet Take 4 tablets x 1 day  and then decrease by 1/2 tablet per day until down to zero mg.   aspirin EC 81 MG tablet Take 1 tablet (81 mg total) by mouth daily. Swallow whole.   atorvastatin (LIPITOR) 20 MG tablet TAKE 1 TABLET BY MOUTH AT BEDTIME FOR CHOLESTEROL   cholecalciferol (VITAMIN D) 1000 units tablet Take 2,000 Units by mouth daily.   Cyanocobalamin (VITAMIN B12) 1000 MCG TBCR    Esomeprazole Magnesium (NEXIUM PO) Take by mouth.   fluticasone (FLONASE) 50 MCG/ACT nasal spray Place 2 sprays into both nostrils daily.   levothyroxine (SYNTHROID) 100 MCG tablet TAKE 1 TABLET IN THE MORNING BEFORE BREAKFAST   sertraline (ZOLOFT) 25 MG tablet TAKE (1) OR (2) TABLETS BY MOUTH DAILY.   verapamil (CALAN-SR) 180 MG CR tablet Take 180 mg by mouth daily.   [DISCONTINUED] levothyroxine (SYNTHROID) 100 MCG tablet TAKE 1 TABLET IN THE MORNING BEFORE BREAKFAST   [DISCONTINUED] verapamil (CALAN-SR) 120 MG CR tablet Take 120 mg by mouth daily.   No facility-administered encounter medications on file as of 11/04/2021.     Lab Results  Component Value Date   WBC 9.9 07/02/2021   HGB 13.0 07/02/2021   HCT 38.8 07/02/2021   PLT 253.0 07/02/2021   GLUCOSE 86 07/02/2021   CHOL 172 07/02/2021   TRIG 75.0 07/02/2021   HDL 74.00 07/02/2021   LDLCALC 83 07/02/2021   ALT 24 07/02/2021   AST 24 07/02/2021   NA 138 07/02/2021   K 4.8 07/02/2021   CL 103 07/02/2021   CREATININE 0.76 07/02/2021   BUN 9 07/02/2021   CO2 30 07/02/2021   TSH 4.09 08/02/2021    US RENAL  Result Date: 08/20/2021 CLINICAL DATA:  Renal cyst EXAM: RENAL / URINARY TRACT ULTRASOUND COMPLETE COMPARISON:  Renal ultrasound 08/17/2020 FINDINGS: Right Kidney: Renal measurements: 8.9 x 4.1 x 4.6 cm = volume: 87.3 mL. Echogenicity within normal limits. No suspicious mass or hydronephrosis visualized. A few anechoic cysts measuring up to 1.1 cm laterally. Left Kidney: Renal  measurements: 10 x 4.3 x 4.6 cm = volume: 104.5 mL. Echogenicity within normal limits. No suspicious mass or hydronephrosis visualized. A few anechoic cysts identified measuring up to 0.9 cm in the midpole. Bladder: Appears normal for degree of bladder distention. Other: None. IMPRESSION: Small bilateral renal cysts.  No hydronephrosis. Electronically Signed   By: Ofilia Neas M.D.   On: 08/20/2021 15:20       Assessment & Plan:   Problem List Items Addressed This Visit     Aortic atherosclerosis ([place])    Continue lipitor.       Relevant Medications   verapamil (CALAN-SR) 180 MG CR tablet   CAD (coronary artery disease)    Recent CT chest - calcification in aorta and Left main and 3 vessel disease.  Have discussed risk factor modification. have discussed the need to quit smoking.  Family history.  Saw cardiology.  myoview unremarkable.  ECHO with preserved LV function.  Continue medical management.       Relevant Medications   verapamil (CALAN-SR) 180 MG CR tablet   Cluster headache    Episodic cluster headaches.  Continue verapamil - recently increased to 180mg .  Has sumatriptan.        Relevant Medications   verapamil (CALAN-SR) 180 MG CR tablet   Cough - Primary    Persistent increased cough, congestion and wheezing as outlined.  Treat with saline nasal spray and steroid nasal spray as directed.  Zpak.  Prednisone taper.  Albuterol  inhaler prn.  Robitussin DM.  Nasal swab for covid, flu and RSV.  Discussed quarantine guidelines.  Follow.  Call with update.        Relevant Orders   COVID-19, Flu A+B and RSV (Completed)   Emphysema lung (H[facility])    Increased cough, congestion, wheezing as outlined.  Treat current infection as outlined.  Follow.        Relevant Medications   predniSONE (DELTASONE) 10 MG tablet   albuterol (VENTOLIN HFA) 108 (90 Base) MCG/ACT inhaler   Healthcare maintenance    Physical - 11/04/21.  Mammogram 04/12/21 - Briads I.  Notify when agreeable for  colonoscopy.       Hypercholesterolemia    Continue lipitor.  Low cholesterol diet and exercise.  Follow lipid panel and liver function tests.        Relevant Medications   verapamil (CALAN-SR) 180 MG CR tablet   Other Relevant Orders   Lipid panel   Hepatic function panel   Basic metabolic panel   Hypothyroidism    On synthroid 118mcg q day.  Follow tsh.        Relevant Medications   levothyroxine (SYNTHROID) 100 MCG tablet   Other Relevant Orders   TSH   Renal cyst    Was evaluated - Dr Erlene Quan - recommended f/u in 2 years.        Stress    On zoloft.  Does not feel needs any further intervention at this time.  Follow.       Tobacco abuse    Have discussed the need to quit.  Follow.         Einar Pheasant, MD

## 2021-11-04 NOTE — Patient Instructions (Signed)
Take prednisone taper as directed.   Can use saline nasal spray if needed.    Robitussin DM  Take the zpak as directed.    Albuterol inhaler if needed.

## 2021-11-05 LAB — COVID-19, FLU A+B AND RSV
Influenza A, NAA: NOT DETECTED
Influenza B, NAA: NOT DETECTED
RSV, NAA: NOT DETECTED
SARS-CoV-2, NAA: NOT DETECTED

## 2021-11-10 ENCOUNTER — Encounter: Payer: Self-pay | Admitting: Internal Medicine

## 2021-11-10 DIAGNOSIS — I779 Disorder of arteries and arterioles, unspecified: Secondary | ICD-10-CM | POA: Insufficient documentation

## 2021-11-10 DIAGNOSIS — R059 Cough, unspecified: Secondary | ICD-10-CM | POA: Insufficient documentation

## 2021-11-10 NOTE — Assessment & Plan Note (Signed)
Increased cough, congestion, wheezing as outlined.  Treat current infection as outlined.  Follow.

## 2021-11-10 NOTE — Assessment & Plan Note (Signed)
Was evaluated - Dr Erlene Quan - recommended f/u in 2 years.

## 2021-11-10 NOTE — Assessment & Plan Note (Signed)
Recent CT chest - calcification in aorta and Left main and 3 vessel disease.  Have discussed risk factor modification. have discussed the need to quit smoking.  Family history.  Saw cardiology.  myoview unremarkable.  ECHO with preserved LV function.  Continue medical management.  

## 2021-11-10 NOTE — Assessment & Plan Note (Addendum)
On synthroid 124mcg q day.  Follow tsh.

## 2021-11-10 NOTE — Assessment & Plan Note (Signed)
Continue lipitor  ?

## 2021-11-10 NOTE — Assessment & Plan Note (Addendum)
Persistent increased cough, congestion and wheezing as outlined.  Treat with saline nasal spray and steroid nasal spray as directed.  Zpak.  Prednisone taper.  Albuterol inhaler prn.  Robitussin DM.  Nasal swab for covid, flu and RSV.  Discussed quarantine guidelines.  Follow.  Call with update.

## 2021-11-10 NOTE — Assessment & Plan Note (Signed)
On zoloft.  Does not feel needs any further intervention at this time.  Follow.  

## 2021-11-10 NOTE — Assessment & Plan Note (Signed)
Episodic cluster headaches.  Continue verapamil - recently increased to 180mg.  Has sumatriptan.   

## 2021-11-10 NOTE — Assessment & Plan Note (Signed)
Have discussed the need to quit.  Follow.  

## 2021-11-10 NOTE — Assessment & Plan Note (Signed)
Continue lipitor.  Low cholesterol diet and exercise.  Follow lipid panel and liver function tests.   

## 2021-11-10 NOTE — Assessment & Plan Note (Signed)
Physical - 11/04/21.  Mammogram 04/12/21 - Briads I.  Notify when agreeable for colonoscopy.

## 2021-12-04 ENCOUNTER — Other Ambulatory Visit (INDEPENDENT_AMBULATORY_CARE_PROVIDER_SITE_OTHER): Payer: Medicare Other

## 2021-12-04 ENCOUNTER — Other Ambulatory Visit: Payer: Self-pay

## 2021-12-04 DIAGNOSIS — E039 Hypothyroidism, unspecified: Secondary | ICD-10-CM | POA: Diagnosis not present

## 2021-12-04 DIAGNOSIS — E7800 Pure hypercholesterolemia, unspecified: Secondary | ICD-10-CM | POA: Diagnosis not present

## 2021-12-04 LAB — LIPID PANEL
Cholesterol: 203 mg/dL — ABNORMAL HIGH (ref 0–200)
HDL: 75.7 mg/dL (ref >39.00)
LDL Cholesterol: 109 mg/dL — ABNORMAL HIGH (ref 0–99)
NonHDL: 127.03
Total CHOL/HDL Ratio: 3
Triglycerides: 92 mg/dL (ref 0.0–149.0)
VLDL: 18.4 mg/dL (ref 0.0–40.0)

## 2021-12-04 LAB — BASIC METABOLIC PANEL
BUN: 9 mg/dL (ref 6–23)
CO2: 32 mEq/L (ref 19–32)
Calcium: 9.6 mg/dL (ref 8.4–10.5)
Chloride: 101 mEq/L (ref 96–112)
Creatinine, Ser: 0.68 mg/dL (ref 0.40–1.20)
GFR: 88.21 mL/min (ref >60.00)
Glucose, Bld: 87 mg/dL (ref 70–99)
Potassium: 4.4 mEq/L (ref 3.5–5.1)
Sodium: 139 mEq/L (ref 135–145)

## 2021-12-04 LAB — HEPATIC FUNCTION PANEL
ALT: 21 U/L (ref 0–35)
AST: 20 U/L (ref 0–37)
Albumin: 4.4 g/dL (ref 3.5–5.2)
Alkaline Phosphatase: 54 U/L (ref 39–117)
Bilirubin, Direct: 0.1 mg/dL (ref 0.0–0.3)
Total Bilirubin: 0.6 mg/dL (ref 0.2–1.2)
Total Protein: 6.5 g/dL (ref 6.0–8.3)

## 2021-12-04 LAB — TSH: TSH: 2.59 u[IU]/mL (ref 0.35–5.50)

## 2021-12-05 ENCOUNTER — Telehealth: Payer: Self-pay

## 2021-12-05 NOTE — Telephone Encounter (Addendum)
LM for labs  ----- Message from Einar Pheasant, MD sent at 12/04/2021 11:09 PM EST ----- Notify - cholesterol levels have increased.  Need to confirm if she is taking lipitor 20mg  q day and taking regularly.  If so, then need to increase to 40mg  q day.  Continue low cholesterol diet and exercise.  We will follow.  Thyroid test, kidney function tests and liver function tests are wnl

## 2021-12-26 ENCOUNTER — Ambulatory Visit (INDEPENDENT_AMBULATORY_CARE_PROVIDER_SITE_OTHER): Payer: Medicare Other

## 2021-12-26 ENCOUNTER — Ambulatory Visit (INDEPENDENT_AMBULATORY_CARE_PROVIDER_SITE_OTHER): Payer: Medicare Other | Admitting: Internal Medicine

## 2021-12-26 ENCOUNTER — Other Ambulatory Visit: Payer: Self-pay

## 2021-12-26 VITALS — BP 128/70 | HR 71 | Temp 98.0°F | Resp 16 | Ht 60.0 in | Wt 118.0 lb

## 2021-12-26 DIAGNOSIS — E039 Hypothyroidism, unspecified: Secondary | ICD-10-CM | POA: Diagnosis not present

## 2021-12-26 DIAGNOSIS — Z720 Tobacco use: Secondary | ICD-10-CM | POA: Diagnosis not present

## 2021-12-26 DIAGNOSIS — I2510 Atherosclerotic heart disease of native coronary artery without angina pectoris: Secondary | ICD-10-CM | POA: Diagnosis not present

## 2021-12-26 DIAGNOSIS — R059 Cough, unspecified: Secondary | ICD-10-CM

## 2021-12-26 DIAGNOSIS — J432 Centrilobular emphysema: Secondary | ICD-10-CM | POA: Diagnosis not present

## 2021-12-26 DIAGNOSIS — F439 Reaction to severe stress, unspecified: Secondary | ICD-10-CM

## 2021-12-26 DIAGNOSIS — I779 Disorder of arteries and arterioles, unspecified: Secondary | ICD-10-CM | POA: Diagnosis not present

## 2021-12-26 DIAGNOSIS — E7800 Pure hypercholesterolemia, unspecified: Secondary | ICD-10-CM | POA: Diagnosis not present

## 2021-12-26 DIAGNOSIS — E538 Deficiency of other specified B group vitamins: Secondary | ICD-10-CM

## 2021-12-26 DIAGNOSIS — N281 Cyst of kidney, acquired: Secondary | ICD-10-CM

## 2021-12-26 DIAGNOSIS — Z1211 Encounter for screening for malignant neoplasm of colon: Secondary | ICD-10-CM | POA: Diagnosis not present

## 2021-12-26 DIAGNOSIS — I700 Atherosclerosis of aorta: Secondary | ICD-10-CM | POA: Diagnosis not present

## 2021-12-26 MED ORDER — PREDNISONE 10 MG PO TABS: ORAL_TABLET | ORAL | 0 refills | Status: DC

## 2021-12-26 NOTE — Progress Notes (Signed)
Patient ID: Leah Benitez, female   DOB: 19-Oct-1950, 71 y.o.   MRN: [id number] ? ? ?Subjective:  ? ? Patient ID: Leah Benitez, female    DOB: 04-26-1951, 71 y.o.   MRN: [id number] ? ?This visit occurred during the SARS-CoV-2 public health emergency.  Safety protocols were in place, including screening questions prior to the visit, additional usage of staff PPE, and extensive cleaning of exam room while observing appropriate contact time as indicated for disinfecting solutions.  ? ?Patient here for a scheduled follow up.  ? ?Chief Complaint  ?Patient presents with  ? Hyperlipidemia  ? Hypothyroidism  ? Stress  ? .  ? ?HPI ?Follow up regarding her cholesterol, CAD and hypothyroidism.  Had increased cough and congestion last visit.  Treated with prednisone and zpak.  Symptoms resolved.  Noticed approximately one week ago, increased cough and chest congestion.  No chest pain or sob.  Does report has noticed outer ear - will hurt intermittently.  No pain today.  When hurts, denies any redness or swelling.  Handling stress.  On zoloft and feels this is working well.  Discussed quitting smoking.  Discussed the need for colonoscopy.  Discussed last labs.  She wanted to hold increasing lipitor.  ? ? ?Past Medical History:  ?Diagnosis Date  ? Allergy   ? Hyperlipidemia   ? Thyroid disease   ? ?Past Surgical History:  ?Procedure Laterality Date  ? ABDOMINAL HYSTERECTOMY    ? previous abnormal pap smear  ? BREAST BIOPSY    ? TONSILLECTOMY    ? ?Family History  ?Problem Relation Age of Onset  ? Arthritis Mother   ? Stroke Mother   ? Hypertension Mother   ? Arthritis Father   ? Heart disease Father   ? Heart disease Brother   ? Cancer Maternal Aunt   ?     breast  ? ?Social History  ? ?Socioeconomic History  ? Marital status: Married  ?  Spouse name: Not on file  ? Number of children: Not on file  ? Years of education: Not on file  ? Highest education level: Not on file  ?Occupational History  ? Not on file  ?Tobacco Use  ? Smoking  status: Every Day  ?  Packs/day: 1.00  ?  Years: 47.00  ?  Pack years: 47.00  ?  Types: Cigarettes  ? Smokeless tobacco: Never  ?Substance and Sexual Activity  ? Alcohol use: Yes  ?  Alcohol/week: 7.0 standard drinks  ?  Types: 7 Glasses of wine per week  ?  Comment: 1 glass of wine per night  ? Drug use: Not on file  ? Sexual activity: Not on file  ?Other Topics Concern  ? Not on file  ?Social History Narrative  ? Not on file  ? ?Social Determinants of Health  ? ?Financial Resource Strain: Low Risk   ? Difficulty of Paying Living Expenses: Not hard at all  ?Food Insecurity: No Food Insecurity  ? Worried About Charity fundraiser in the Last Year: Never true  ? Ran Out of Food in the Last Year: Never true  ?Transportation Needs: No Transportation Needs  ? Lack of Transportation (Medical): No  ? Lack of Transportation (Non-Medical): No  ?Physical Activity: Not on file  ?Stress: No Stress Concern Present  ? Feeling of Stress : Only a little  ?Social Connections: Unknown  ? Frequency of Communication with Friends and Family: More than three times a week  ? Frequency of Social  Gatherings with Friends and Family: More than three times a week  ? Attends Religious Services: Not on file  ? Active Member of Clubs or Organizations: Not on file  ? Attends Archivist Meetings: Not on file  ? Marital Status: Married  ? ? ? ?Review of Systems  ?Constitutional:  Negative for appetite change and unexpected weight change.  ?HENT:  Negative for congestion and sinus pressure.   ?Respiratory:  Positive for cough. Negative for chest tightness and shortness of breath.   ?Cardiovascular:  Negative for chest pain, palpitations and leg swelling.  ?Gastrointestinal:  Negative for abdominal pain, diarrhea, nausea and vomiting.  ?Genitourinary:  Negative for difficulty urinating and dysuria.  ?Musculoskeletal:  Negative for joint swelling and myalgias.  ?Skin:  Negative for color change and rash.  ?Neurological:  Negative for  dizziness, light-headedness and headaches.  ?Psychiatric/Behavioral:  Negative for agitation and dysphoric mood.   ? ?   ?Objective:  ?  ? ?BP 128/70   Pulse 71   Temp 98 ?F (36.7 ?C)   Resp 16   Ht 5' (1.524 m)   Wt 118 lb (53.5 kg)   SpO2 98%   BMI 23.05 kg/m?  ?Wt Readings from Last 3 Encounters:  ?12/26/21 118 lb (53.5 kg)  ?11/04/21 117 lb 12.8 oz (53.4 kg)  ?10/25/21 119 lb (54 kg)  ? ? ?Physical Exam ?Vitals reviewed.  ?Constitutional:   ?   General: She is not in acute distress. ?   Appearance: Normal appearance.  ?HENT:  ?   Head: Normocephalic and atraumatic.  ?   Right Ear: External ear normal.  ?   Left Ear: External ear normal.  ?Eyes:  ?   General: No scleral icterus.    ?   Right eye: No discharge.     ?   Left eye: No discharge.  ?   Conjunctiva/sclera: Conjunctivae normal.  ?Neck:  ?   Thyroid: No thyromegaly.  ?Cardiovascular:  ?   Rate and Rhythm: Normal rate and regular rhythm.  ?Pulmonary:  ?   Effort: No respiratory distress.  ?   Breath sounds: Normal breath sounds. No wheezing.  ?   Comments: Increased cough with taking deep breaths and expiration.  ?Abdominal:  ?   General: Bowel sounds are normal.  ?   Palpations: Abdomen is soft.  ?   Tenderness: There is no abdominal tenderness.  ?Musculoskeletal:     ?   General: No swelling or tenderness.  ?   Cervical back: Neck supple. No tenderness.  ?Lymphadenopathy:  ?   Cervical: No cervical adenopathy.  ?Skin: ?   Findings: No erythema or rash.  ?Neurological:  ?   Mental Status: She is alert.  ?Psychiatric:     ?   Mood and Affect: Mood normal.     ?   Behavior: Behavior normal.  ? ? ? ?Outpatient Encounter Medications as of 12/26/2021  ?Medication Sig  ? predniSONE (DELTASONE) 10 MG tablet Take 4 tablets x 1 day and then decrease by 1/2 tablet per day until down to zero mg.  ? albuterol (VENTOLIN HFA) 108 (90 Base) MCG/ACT inhaler Inhale 2 puffs into the lungs every 6 (six) hours as needed for wheezing or shortness of breath.  ? aspirin  EC 81 MG tablet Take 1 tablet (81 mg total) by mouth daily. Swallow whole.  ? atorvastatin (LIPITOR) 20 MG tablet TAKE 1 TABLET BY MOUTH AT BEDTIME FOR CHOLESTEROL  ? cholecalciferol (VITAMIN D) 1000 units  tablet Take 2,000 Units by mouth daily.  ? Cyanocobalamin (VITAMIN B12) 1000 MCG TBCR   ? Esomeprazole Magnesium (NEXIUM PO) Take by mouth.  ? fluticasone (FLONASE) 50 MCG/ACT nasal spray Place 2 sprays into both nostrils daily.  ? levothyroxine (SYNTHROID) 100 MCG tablet TAKE 1 TABLET IN THE MORNING BEFORE BREAKFAST  ? sertraline (ZOLOFT) 25 MG tablet TAKE (1) OR (2) TABLETS BY MOUTH DAILY.  ? verapamil (CALAN-SR) 180 MG CR tablet Take 180 mg by mouth daily.  ? [DISCONTINUED] predniSONE (DELTASONE) 10 MG tablet Take 4 tablets x 1 day and then decrease by 1/2 tablet per day until down to zero mg.  ? ?No facility-administered encounter medications on file as of 12/26/2021.  ?  ? ?Lab Results  ?Component Value Date  ? WBC 9.9 07/02/2021  ? HGB 13.0 07/02/2021  ? HCT 38.8 07/02/2021  ? PLT 253.0 07/02/2021  ? GLUCOSE 87 12/04/2021  ? CHOL 203 (H) 12/04/2021  ? TRIG 92.0 12/04/2021  ? HDL 75.70 12/04/2021  ? LDLCALC 109 (H) 12/04/2021  ? ALT 21 12/04/2021  ? AST 20 12/04/2021  ? NA 139 12/04/2021  ? K 4.4 12/04/2021  ? CL 101 12/04/2021  ? CREATININE 0.68 12/04/2021  ? BUN 9 12/04/2021  ? CO2 32 12/04/2021  ? TSH 2.59 12/04/2021  ? ? ?US RENAL ? ?Result Date: 08/20/2021 ?CLINICAL DATA:  Renal cyst EXAM: RENAL / URINARY TRACT ULTRASOUND COMPLETE COMPARISON:  Renal ultrasound 08/17/2020 FINDINGS: Right Kidney: Renal measurements: 8.9 x 4.1 x 4.6 cm = volume: 87.3 mL. Echogenicity within normal limits. No suspicious mass or hydronephrosis visualized. A few anechoic cysts measuring up to 1.1 cm laterally. Left Kidney: Renal measurements: 10 x 4.3 x 4.6 cm = volume: 104.5 mL. Echogenicity within normal limits. No suspicious mass or hydronephrosis visualized. A few anechoic cysts identified measuring up to 0.9 cm in the  midpole. Bladder: Appears normal for degree of bladder distention. Other: None. IMPRESSION: Small bilateral renal cysts.  No hydronephrosis. Electronically Signed   By: Ofilia Neas M.D.   On: 08/20/2021 15:20  ? ? ?   ?A

## 2021-12-28 ENCOUNTER — Encounter: Payer: Self-pay | Admitting: Internal Medicine

## 2021-12-28 NOTE — Assessment & Plan Note (Addendum)
Continue lipitor.  Low cholesterol diet and exercise.  Discussed recent labs and increasing dose of lipitor.  Prefers not to adjust dose.  Follow lipid panel and liver function tests.   ?

## 2021-12-28 NOTE — Assessment & Plan Note (Signed)
On synthroid 111mg q day.  Follow tsh.   ?

## 2021-12-28 NOTE — Assessment & Plan Note (Signed)
Continue lipitor  ?

## 2021-12-28 NOTE — Assessment & Plan Note (Signed)
Recent CT chest - calcification in aorta and Left main and 3 vessel disease.  Have discussed risk factor modification. have discussed the need to quit smoking.  Family history.  Saw cardiology.  myoview unremarkable.  ECHO with preserved LV function.  Continue medical management.  

## 2021-12-28 NOTE — Assessment & Plan Note (Addendum)
Was evaluated - Dr Erlene Quan (08/2021) - recommended f/u in 2 years.   ?

## 2021-12-28 NOTE — Assessment & Plan Note (Signed)
Discussed the need to quit.  Declines to quit at this time.  Follow.  ?

## 2021-12-28 NOTE — Assessment & Plan Note (Signed)
Cough as outlined.  Previously treated with steroids and abx.  Continues to smoke.  Return of symptoms.  Treat with prednisone taper as directed.  Hold abx.  Albuterol inhaler prn.  Check cxr.  Follow.  Call with update.  ?

## 2021-12-28 NOTE — Assessment & Plan Note (Signed)
Follow B12 level.  

## 2021-12-28 NOTE — Assessment & Plan Note (Signed)
Increased cough/congestion as outlined.  Treat with prednisone taper as directed.  Check cxr.  Delsym prn.  Albuterol inhaler prn.  Follow. Call with update.  ?

## 2021-12-28 NOTE — Assessment & Plan Note (Signed)
On zoloft.  Does not feel needs any further intervention at this time.  Follow.  

## 2021-12-28 NOTE — Assessment & Plan Note (Signed)
Due f/u colonoscopy.  Discussed.  Will call back with name of GI MD to see.  ?

## 2021-12-28 NOTE — Assessment & Plan Note (Signed)
Life line screening - 10/21/21 - mild carotid artery disease.  Continue statin and adequate blood pressure control.  Follow.  

## 2021-12-30 ENCOUNTER — Telehealth: Payer: Self-pay

## 2021-12-30 NOTE — Telephone Encounter (Signed)
LMTCB

## 2021-12-30 NOTE — Telephone Encounter (Signed)
-----   Message from Einar Pheasant, MD sent at 12/28/2021  1:45 PM EDT ----- ?Notify Leah Benitez that her cxr reveals no acute abnormaltiy.  Some changes c/w smoking, but no acute abnormality.  No fluid.  No pneumonia.  Please confirm she is doing better.  Was treated with prednisone.  If persistent cough, refer to pulmonary - see if has preference of which MD she wants to see.  ?

## 2022-01-02 ENCOUNTER — Other Ambulatory Visit: Payer: Self-pay | Admitting: Internal Medicine

## 2022-01-27 DIAGNOSIS — H524 Presbyopia: Secondary | ICD-10-CM | POA: Diagnosis not present

## 2022-01-27 DIAGNOSIS — H2513 Age-related nuclear cataract, bilateral: Secondary | ICD-10-CM | POA: Diagnosis not present

## 2022-02-07 ENCOUNTER — Telehealth: Payer: Self-pay | Admitting: Acute Care

## 2022-02-07 NOTE — Telephone Encounter (Signed)
Attempted to reach pt for annual LDCT-LVMM ?

## 2022-02-10 ENCOUNTER — Other Ambulatory Visit: Payer: Self-pay | Admitting: *Deleted

## 2022-02-10 DIAGNOSIS — F17210 Nicotine dependence, cigarettes, uncomplicated: Secondary | ICD-10-CM

## 2022-02-10 DIAGNOSIS — Z122 Encounter for screening for malignant neoplasm of respiratory organs: Secondary | ICD-10-CM

## 2022-02-10 DIAGNOSIS — Z87891 Personal history of nicotine dependence: Secondary | ICD-10-CM

## 2022-02-19 ENCOUNTER — Other Ambulatory Visit: Payer: Self-pay | Admitting: Internal Medicine

## 2022-02-20 DIAGNOSIS — I700 Atherosclerosis of aorta: Secondary | ICD-10-CM | POA: Diagnosis not present

## 2022-02-20 DIAGNOSIS — I2510 Atherosclerotic heart disease of native coronary artery without angina pectoris: Secondary | ICD-10-CM | POA: Diagnosis not present

## 2022-02-20 DIAGNOSIS — Z720 Tobacco use: Secondary | ICD-10-CM | POA: Diagnosis not present

## 2022-02-20 DIAGNOSIS — I779 Disorder of arteries and arterioles, unspecified: Secondary | ICD-10-CM | POA: Diagnosis not present

## 2022-02-20 DIAGNOSIS — R001 Bradycardia, unspecified: Secondary | ICD-10-CM | POA: Diagnosis not present

## 2022-02-20 DIAGNOSIS — E782 Mixed hyperlipidemia: Secondary | ICD-10-CM | POA: Diagnosis not present

## 2022-02-20 DIAGNOSIS — J439 Emphysema, unspecified: Secondary | ICD-10-CM | POA: Diagnosis not present

## 2022-02-25 ENCOUNTER — Ambulatory Visit
Admission: RE | Admit: 2022-02-25 | Discharge: 2022-02-25 | Disposition: A | Discharge status: Home / Self Care | Payer: Medicare Other | Source: Ambulatory Visit | Attending: Acute Care | Admitting: Acute Care

## 2022-02-25 DIAGNOSIS — Z122 Encounter for screening for malignant neoplasm of respiratory organs: Secondary | ICD-10-CM | POA: Insufficient documentation

## 2022-02-25 DIAGNOSIS — Z87891 Personal history of nicotine dependence: Secondary | ICD-10-CM | POA: Diagnosis not present

## 2022-02-25 DIAGNOSIS — F17210 Nicotine dependence, cigarettes, uncomplicated: Secondary | ICD-10-CM | POA: Diagnosis not present

## 2022-02-27 ENCOUNTER — Other Ambulatory Visit: Payer: Self-pay | Admitting: Acute Care

## 2022-02-27 DIAGNOSIS — F17210 Nicotine dependence, cigarettes, uncomplicated: Secondary | ICD-10-CM

## 2022-02-27 DIAGNOSIS — Z87891 Personal history of nicotine dependence: Secondary | ICD-10-CM

## 2022-02-27 DIAGNOSIS — Z122 Encounter for screening for malignant neoplasm of respiratory organs: Secondary | ICD-10-CM

## 2022-03-21 ENCOUNTER — Encounter: Payer: Self-pay | Admitting: Internal Medicine

## 2022-03-21 ENCOUNTER — Other Ambulatory Visit (INDEPENDENT_AMBULATORY_CARE_PROVIDER_SITE_OTHER): Payer: Medicare Other

## 2022-03-21 DIAGNOSIS — E7800 Pure hypercholesterolemia, unspecified: Secondary | ICD-10-CM | POA: Diagnosis not present

## 2022-03-21 LAB — HEPATIC FUNCTION PANEL
ALT: 22 U/L (ref 0–35)
AST: 21 U/L (ref 0–37)
Albumin: 4.4 g/dL (ref 3.5–5.2)
Alkaline Phosphatase: 53 U/L (ref 39–117)
Bilirubin, Direct: 0.1 mg/dL (ref 0.0–0.3)
Total Bilirubin: 0.5 mg/dL (ref 0.2–1.2)
Total Protein: 6.4 g/dL (ref 6.0–8.3)

## 2022-03-21 LAB — BASIC METABOLIC PANEL
BUN: 10 mg/dL (ref 6–23)
CO2: 29 mEq/L (ref 19–32)
Calcium: 9.8 mg/dL (ref 8.4–10.5)
Chloride: 101 mEq/L (ref 96–112)
Creatinine, Ser: 0.66 mg/dL (ref 0.40–1.20)
GFR: 88.66 mL/min (ref >60.00)
Glucose, Bld: 85 mg/dL (ref 70–99)
Potassium: 4.4 mEq/L (ref 3.5–5.1)
Sodium: 138 mEq/L (ref 135–145)

## 2022-03-21 LAB — LIPID PANEL
Cholesterol: 184 mg/dL (ref 0–200)
HDL: 75.6 mg/dL (ref >39.00)
LDL Cholesterol: 94 mg/dL (ref 0–99)
NonHDL: 108.61
Total CHOL/HDL Ratio: 2
Triglycerides: 72 mg/dL (ref 0.0–149.0)
VLDL: 14.4 mg/dL (ref 0.0–40.0)

## 2022-03-25 ENCOUNTER — Other Ambulatory Visit: Payer: Self-pay | Admitting: Internal Medicine

## 2022-03-31 ENCOUNTER — Ambulatory Visit: Payer: Medicare Other | Admitting: Internal Medicine

## 2022-04-04 ENCOUNTER — Telehealth: Payer: Self-pay

## 2022-04-04 ENCOUNTER — Ambulatory Visit (INDEPENDENT_AMBULATORY_CARE_PROVIDER_SITE_OTHER): Payer: Medicare Other | Admitting: Internal Medicine

## 2022-04-04 ENCOUNTER — Encounter: Payer: Self-pay | Admitting: Internal Medicine

## 2022-04-04 VITALS — BP 128/72 | HR 72 | Temp 98.2°F | Resp 18 | Ht 60.0 in | Wt 111.4 lb

## 2022-04-04 DIAGNOSIS — S2241XD Multiple fractures of ribs, right side, subsequent encounter for fracture with routine healing: Secondary | ICD-10-CM

## 2022-04-04 DIAGNOSIS — Z1211 Encounter for screening for malignant neoplasm of colon: Secondary | ICD-10-CM

## 2022-04-04 DIAGNOSIS — F419 Anxiety disorder, unspecified: Secondary | ICD-10-CM | POA: Diagnosis not present

## 2022-04-04 DIAGNOSIS — Z720 Tobacco use: Secondary | ICD-10-CM

## 2022-04-04 DIAGNOSIS — I2510 Atherosclerotic heart disease of native coronary artery without angina pectoris: Secondary | ICD-10-CM | POA: Diagnosis not present

## 2022-04-04 DIAGNOSIS — Z87891 Personal history of nicotine dependence: Secondary | ICD-10-CM

## 2022-04-04 DIAGNOSIS — R0602 Shortness of breath: Secondary | ICD-10-CM | POA: Diagnosis not present

## 2022-04-04 DIAGNOSIS — E039 Hypothyroidism, unspecified: Secondary | ICD-10-CM

## 2022-04-04 DIAGNOSIS — E2839 Other primary ovarian failure: Secondary | ICD-10-CM

## 2022-04-04 DIAGNOSIS — J432 Centrilobular emphysema: Secondary | ICD-10-CM | POA: Diagnosis not present

## 2022-04-04 DIAGNOSIS — E7800 Pure hypercholesterolemia, unspecified: Secondary | ICD-10-CM | POA: Diagnosis not present

## 2022-04-04 DIAGNOSIS — Z1231 Encounter for screening mammogram for malignant neoplasm of breast: Secondary | ICD-10-CM

## 2022-04-04 DIAGNOSIS — Z8742 Personal history of other diseases of the female genital tract: Secondary | ICD-10-CM

## 2022-04-04 DIAGNOSIS — I779 Disorder of arteries and arterioles, unspecified: Secondary | ICD-10-CM | POA: Diagnosis not present

## 2022-04-04 DIAGNOSIS — I700 Atherosclerosis of aorta: Secondary | ICD-10-CM

## 2022-04-04 DIAGNOSIS — N281 Cyst of kidney, acquired: Secondary | ICD-10-CM | POA: Diagnosis not present

## 2022-04-04 DIAGNOSIS — F439 Reaction to severe stress, unspecified: Secondary | ICD-10-CM

## 2022-04-04 MED ORDER — ATORVASTATIN CALCIUM 20 MG PO TABS: ORAL_TABLET | ORAL | 3 refills | Status: DC

## 2022-04-05 ENCOUNTER — Encounter: Payer: Self-pay | Admitting: Internal Medicine

## 2022-04-05 DIAGNOSIS — Z1239 Encounter for other screening for malignant neoplasm of breast: Secondary | ICD-10-CM | POA: Insufficient documentation

## 2022-04-05 DIAGNOSIS — S2249XA Multiple fractures of ribs, unspecified side, initial encounter for closed fracture: Secondary | ICD-10-CM | POA: Insufficient documentation

## 2022-04-05 NOTE — Assessment & Plan Note (Signed)
Recently found on CT.  Recent injury as outlined.  Better.  Decreased pain.  Able to take a good breath without increased pain.  Discussed the need for bone density.  Will notify when agreeable.

## 2022-04-05 NOTE — Assessment & Plan Note (Signed)
On zoloft.  Does not feel needs any further intervention at this time.  Follow.  

## 2022-04-05 NOTE — Assessment & Plan Note (Signed)
Discussed the need to quit.  Declines to quit at this time.  Follow.  ?

## 2022-04-05 NOTE — Assessment & Plan Note (Signed)
Discussed the need to quit smoking.  She desires not to quit at this time.  Follow.  

## 2022-04-08 ENCOUNTER — Other Ambulatory Visit: Payer: Self-pay

## 2022-04-08 ENCOUNTER — Encounter: Payer: Self-pay | Admitting: Internal Medicine

## 2022-04-08 DIAGNOSIS — Z780 Asymptomatic menopausal state: Secondary | ICD-10-CM

## 2022-04-08 DIAGNOSIS — Z1211 Encounter for screening for malignant neoplasm of colon: Secondary | ICD-10-CM

## 2022-05-02 DIAGNOSIS — Z1231 Encounter for screening mammogram for malignant neoplasm of breast: Secondary | ICD-10-CM | POA: Diagnosis not present

## 2022-05-02 LAB — HM MAMMOGRAPHY

## 2022-06-18 ENCOUNTER — Other Ambulatory Visit: Payer: Medicare Other

## 2022-06-25 ENCOUNTER — Encounter: Payer: Self-pay | Admitting: Internal Medicine

## 2022-06-30 ENCOUNTER — Ambulatory Visit
Admission: RE | Admit: 2022-06-30 | Discharge: 2022-06-30 | Disposition: A | Discharge status: Home / Self Care | Payer: Medicare Other | Source: Ambulatory Visit | Attending: Internal Medicine | Admitting: Internal Medicine

## 2022-06-30 DIAGNOSIS — M810 Age-related osteoporosis without current pathological fracture: Secondary | ICD-10-CM | POA: Diagnosis not present

## 2022-06-30 DIAGNOSIS — Z780 Asymptomatic menopausal state: Secondary | ICD-10-CM | POA: Insufficient documentation

## 2022-06-30 DIAGNOSIS — M8588 Other specified disorders of bone density and structure, other site: Secondary | ICD-10-CM | POA: Diagnosis not present

## 2022-07-01 ENCOUNTER — Telehealth: Payer: Self-pay

## 2022-07-01 NOTE — Telephone Encounter (Signed)
Lvm for pt to return call in regards to bone density results.  Per Dr.Scott: Notify - bone density reveals osteoporosis.  Continue calcium, vitamin D and weight bearing exercise.  Can discuss other treatment options at her next appt.  Please make note on schedule to discuss bone density

## 2022-07-01 NOTE — Telephone Encounter (Signed)
Patient returned office phone call and note was read. 

## 2022-07-25 DIAGNOSIS — Z01818 Encounter for other preprocedural examination: Secondary | ICD-10-CM | POA: Diagnosis not present

## 2022-07-25 DIAGNOSIS — Z1211 Encounter for screening for malignant neoplasm of colon: Secondary | ICD-10-CM | POA: Diagnosis not present

## 2022-08-05 ENCOUNTER — Other Ambulatory Visit (INDEPENDENT_AMBULATORY_CARE_PROVIDER_SITE_OTHER): Payer: Medicare Other

## 2022-08-05 DIAGNOSIS — E7800 Pure hypercholesterolemia, unspecified: Secondary | ICD-10-CM | POA: Diagnosis not present

## 2022-08-05 DIAGNOSIS — E039 Hypothyroidism, unspecified: Secondary | ICD-10-CM | POA: Diagnosis not present

## 2022-08-05 LAB — BASIC METABOLIC PANEL
BUN: 8 mg/dL (ref 6–23)
CO2: 30 mEq/L (ref 19–32)
Calcium: 9.3 mg/dL (ref 8.4–10.5)
Chloride: 99 mEq/L (ref 96–112)
Creatinine, Ser: 0.67 mg/dL (ref 0.40–1.20)
GFR: 88.11 mL/min (ref >60.00)
Glucose, Bld: 88 mg/dL (ref 70–99)
Potassium: 4.1 mEq/L (ref 3.5–5.1)
Sodium: 136 mEq/L (ref 135–145)

## 2022-08-05 LAB — CBC WITH DIFFERENTIAL/PLATELET
Basophils Absolute: 0.1 10*3/uL (ref 0.0–0.1)
Basophils Relative: 0.9 % (ref 0.0–3.0)
Eosinophils Absolute: 0.1 10*3/uL (ref 0.0–0.7)
Eosinophils Relative: 0.8 % (ref 0.0–5.0)
HCT: 40.3 % (ref 36.0–46.0)
Hemoglobin: 13.4 g/dL (ref 12.0–15.0)
Lymphocytes Relative: 24.7 % (ref 12.0–46.0)
Lymphs Abs: 2.4 10*3/uL (ref 0.7–4.0)
MCHC: 33.3 g/dL (ref 30.0–36.0)
MCV: 100.8 fl — ABNORMAL HIGH (ref 78.0–100.0)
Monocytes Absolute: 0.7 10*3/uL (ref 0.1–1.0)
Monocytes Relative: 7.2 % (ref 3.0–12.0)
Neutro Abs: 6.4 10*3/uL (ref 1.4–7.7)
Neutrophils Relative %: 66.4 % (ref 43.0–77.0)
Platelets: 265 10*3/uL (ref 150.0–400.0)
RBC: 4 Mil/uL (ref 3.87–5.11)
RDW: 13.6 % (ref 11.5–15.5)
WBC: 9.6 10*3/uL (ref 4.0–10.5)

## 2022-08-05 LAB — HEPATIC FUNCTION PANEL
ALT: 15 U/L (ref 0–35)
AST: 18 U/L (ref 0–37)
Albumin: 4.5 g/dL (ref 3.5–5.2)
Alkaline Phosphatase: 54 U/L (ref 39–117)
Bilirubin, Direct: 0.1 mg/dL (ref 0.0–0.3)
Total Bilirubin: 0.6 mg/dL (ref 0.2–1.2)
Total Protein: 6.6 g/dL (ref 6.0–8.3)

## 2022-08-05 LAB — LIPID PANEL
Cholesterol: 184 mg/dL (ref 0–200)
HDL: 77.8 mg/dL (ref >39.00)
LDL Cholesterol: 87 mg/dL (ref 0–99)
NonHDL: 106.43
Total CHOL/HDL Ratio: 2
Triglycerides: 96 mg/dL (ref 0.0–149.0)
VLDL: 19.2 mg/dL (ref 0.0–40.0)

## 2022-08-05 LAB — TSH: TSH: 15.21 u[IU]/mL — ABNORMAL HIGH (ref 0.35–5.50)

## 2022-08-06 DIAGNOSIS — Z23 Encounter for immunization: Secondary | ICD-10-CM | POA: Diagnosis not present

## 2022-08-07 ENCOUNTER — Ambulatory Visit (INDEPENDENT_AMBULATORY_CARE_PROVIDER_SITE_OTHER): Payer: Medicare Other | Admitting: Internal Medicine

## 2022-08-07 ENCOUNTER — Encounter: Payer: Self-pay | Admitting: Internal Medicine

## 2022-08-07 VITALS — BP 116/64 | HR 62 | Temp 98.5°F | Ht 60.0 in | Wt 106.8 lb

## 2022-08-07 DIAGNOSIS — I779 Disorder of arteries and arterioles, unspecified: Secondary | ICD-10-CM | POA: Diagnosis not present

## 2022-08-07 DIAGNOSIS — E039 Hypothyroidism, unspecified: Secondary | ICD-10-CM

## 2022-08-07 DIAGNOSIS — R6881 Early satiety: Secondary | ICD-10-CM

## 2022-08-07 DIAGNOSIS — E7800 Pure hypercholesterolemia, unspecified: Secondary | ICD-10-CM

## 2022-08-07 DIAGNOSIS — J432 Centrilobular emphysema: Secondary | ICD-10-CM | POA: Diagnosis not present

## 2022-08-07 DIAGNOSIS — R1032 Left lower quadrant pain: Secondary | ICD-10-CM | POA: Diagnosis not present

## 2022-08-07 DIAGNOSIS — Z1211 Encounter for screening for malignant neoplasm of colon: Secondary | ICD-10-CM | POA: Diagnosis not present

## 2022-08-07 DIAGNOSIS — I2510 Atherosclerotic heart disease of native coronary artery without angina pectoris: Secondary | ICD-10-CM

## 2022-08-07 DIAGNOSIS — F419 Anxiety disorder, unspecified: Secondary | ICD-10-CM

## 2022-08-07 DIAGNOSIS — I700 Atherosclerosis of aorta: Secondary | ICD-10-CM

## 2022-08-07 DIAGNOSIS — N281 Cyst of kidney, acquired: Secondary | ICD-10-CM | POA: Diagnosis not present

## 2022-08-07 DIAGNOSIS — Z87891 Personal history of nicotine dependence: Secondary | ICD-10-CM | POA: Diagnosis not present

## 2022-08-07 MED ORDER — LEVOTHYROXINE SODIUM 125 MCG PO TABS: 125.0000 ug | ORAL_TABLET | Freq: Every day | ORAL | 1 refills | Status: DC

## 2022-08-07 NOTE — Progress Notes (Signed)
Patient ID: Shaila Dowdy, female   DOB: 02/08/1951, 71 y.o.   MRN: [id number]   Subjective:    Patient ID: Fradel Hlavac, female    DOB: [birth year], 71 y.o.   MRN: [id number]   Patient here for  Chief Complaint  Patient presents with   Follow-up    4 month f/u   .   HPI Here to follow up regarding hypercholesterolemia.  Recently saw GI 07/25/22.  Planning for colonoscopy.  Discussed current GI symptoms.  Reports feeling full fast.  Some indigestion.  On nexium.  Discussed question of need for EGD given upper symptoms despite EGD.  Has lost weight.  She is eating.  Has noticed some left lower quadrant pain.     Past Medical History:  Diagnosis Date   Allergy    Hyperlipidemia    Thyroid disease    Past Surgical History:  Procedure Laterality Date   ABDOMINAL HYSTERECTOMY     previous abnormal pap smear   BREAST BIOPSY     TONSILLECTOMY     Family History  Problem Relation Age of Onset   Arthritis Mother    Stroke Mother    Hypertension Mother    Arthritis Father    Heart disease Father    Heart disease Brother    Cancer Maternal Aunt        breast   Social History   Socioeconomic History   Marital status: Married    Spouse name: Not on file   Number of children: Not on file   Years of education: Not on file   Highest education level: Not on file  Occupational History   Not on file  Tobacco Use   Smoking status: Every Day    Packs/day: 1.00    Years: 47.00    Total pack years: 47.00    Types: Cigarettes   Smokeless tobacco: Never  Substance and Sexual Activity   Alcohol use: Yes    Alcohol/week: 7.0 standard drinks of alcohol    Types: 7 Glasses of wine per week    Comment: 1 glass of wine per night   Drug use: Not on file   Sexual activity: Not on file  Other Topics Concern   Not on file  Social History Narrative   Not on file   Social Determinants of Health   Financial Resource Strain: Low Risk  (10/25/2021)   Overall Financial Resource Strain (CARDIA)     Difficulty of Paying Living Expenses: Not hard at all  Food Insecurity: No Food Insecurity (10/25/2021)   Hunger Vital Sign    Worried About Running Out of Food in the Last Year: Never true    Ran Out of Food in the Last Year: Never true  Transportation Needs: No Transportation Needs (10/25/2021)   PRAPARE - Hydrologist (Medical): No    Lack of Transportation (Non-Medical): No  Physical Activity: Not on file  Stress: No Stress Concern Present (10/25/2021)   [place]    Feeling of Stress : Only a little  Social Connections: Unknown (10/25/2021)   Social Connection and Isolation Panel [NHANES]    Frequency of Communication with Friends and Family: More than three times a week    Frequency of Social Gatherings with Friends and Family: More than three times a week    Attends Religious Services: Not on file    Active Member of Clubs or Organizations: Not on file  Attends Archivist Meetings: Not on file    Marital Status: Married     Review of Systems  Constitutional:  Positive for appetite change and unexpected weight change.  HENT:  Negative for congestion and sinus pressure.   Respiratory:  Negative for cough, chest tightness and shortness of breath.   Cardiovascular:  Negative for chest pain, palpitations and leg swelling.  Gastrointestinal:  Negative for nausea and vomiting.       Some LLQ pain.  Some indigestion.   Genitourinary:  Negative for difficulty urinating and dysuria.  Musculoskeletal:  Negative for joint swelling and myalgias.  Skin:  Negative for color change and rash.  Neurological:  Negative for dizziness, light-headedness and headaches.  Psychiatric/Behavioral:  Negative for agitation and dysphoric mood.        Objective:     BP 116/64 (BP Location: Left Arm, Patient Position: Sitting, Cuff Size: Normal)   Pulse 62   Temp 98.5 F (36.9 C) (Oral)    Ht 5' (1.524 m)   Wt 106 lb 12.8 oz (48.4 kg)   SpO2 97%   BMI 20.86 kg/m  Wt Readings from Last 3 Encounters:  08/07/22 106 lb 12.8 oz (48.4 kg)  04/04/22 111 lb 6.4 oz (50.5 kg)  02/25/22 114 lb (51.7 kg)    Physical Exam Vitals reviewed.  Constitutional:      General: She is not in acute distress.    Appearance: Normal appearance.  HENT:     Head: Normocephalic and atraumatic.     Right Ear: External ear normal.     Left Ear: External ear normal.  Eyes:     General: No scleral icterus.       Right eye: No discharge.        Left eye: No discharge.     Conjunctiva/sclera: Conjunctivae normal.  Neck:     Thyroid: No thyromegaly.  Cardiovascular:     Rate and Rhythm: Normal rate and regular rhythm.  Pulmonary:     Effort: No respiratory distress.     Breath sounds: Normal breath sounds. No wheezing.  Abdominal:     General: Bowel sounds are normal.     Palpations: Abdomen is soft.     Tenderness: There is no abdominal tenderness.  Musculoskeletal:        General: No swelling or tenderness.     Cervical back: Neck supple. No tenderness.  Lymphadenopathy:     Cervical: No cervical adenopathy.  Skin:    Findings: No erythema or rash.  Neurological:     Mental Status: She is alert.  Psychiatric:        Mood and Affect: Mood normal.        Behavior: Behavior normal.      Outpatient Encounter Medications as of 08/07/2022  Medication Sig   albuterol (VENTOLIN HFA) 108 (90 Base) MCG/ACT inhaler Inhale 2 puffs into the lungs every 6 (six) hours as needed for wheezing or shortness of breath.   aspirin EC 81 MG tablet Take 1 tablet (81 mg total) by mouth daily. Swallow whole.   atorvastatin (LIPITOR) 20 MG tablet TAKE 1 TABLET BY MOUTH 4 days a week, and 2 tablets by mouth one day per week at bedtime for cholesterol.   cholecalciferol (VITAMIN D) 1000 units tablet Take 2,000 Units by mouth daily.   Cyanocobalamin (VITAMIN B12) 1000 MCG TBCR    Esomeprazole Magnesium  (NEXIUM PO) Take by mouth.   fluticasone (FLONASE) 50 MCG/ACT nasal spray Place 2 sprays  into both nostrils daily.   levothyroxine (SYNTHROID) 125 MCG tablet Take 1 tablet (125 mcg total) by mouth daily.   verapamil (CALAN-SR) 180 MG CR tablet Take 180 mg by mouth daily.   [DISCONTINUED] levothyroxine (SYNTHROID) 100 MCG tablet TAKE 1 TABLET IN THE MORNING BEFORE BREAKFAST   [DISCONTINUED] sertraline (ZOLOFT) 25 MG tablet TAKE (1) OR (2) TABLETS BY MOUTH DAILY.   No facility-administered encounter medications on file as of 08/07/2022.     Lab Results  Component Value Date   WBC 9.6 08/05/2022   HGB 13.4 08/05/2022   HCT 40.3 08/05/2022   PLT 265.0 08/05/2022   GLUCOSE 88 08/05/2022   CHOL 184 08/05/2022   TRIG 96.0 08/05/2022   HDL 77.80 08/05/2022   LDLCALC 87 08/05/2022   ALT 15 08/05/2022   AST 18 08/05/2022   NA 136 08/05/2022   K 4.1 08/05/2022   CL 99 08/05/2022   CREATININE 0.67 08/05/2022   BUN 8 08/05/2022   CO2 30 08/05/2022   TSH 15.21 (H) 08/05/2022    DG Bone Density  Result Date: 06/30/2022 EXAM: DUAL X-RAY ABSORPTIOMETRY (DXA) FOR BONE MINERAL DENSITY IMPRESSION: Your patient Keisa Toepfer completed a BMD test on 06/30/2022 using the [employer] (software version: 14.10) manufactured by [employer]. The following summarizes the results of our evaluation. Technologist::TNB PATIENT BIOGRAPHICAL: Name: Haisley, Arens Patient ID:  [id number] Birth Date: 1951/08/22 Height:     60.0 in. Gender:      Female Exam Date:  06/30/2022 Weight:     111.4 lbs. Indications: Caucasian, Family Hx of Osteoporosis, History of Fracture (Adult), Hypothyroid, Hysterectomy, Low Calcium Intake, Postmenopausal, Tobacco User Fractures: Foot, Ribs Treatments: Calcium, Synthroid, Vitamin D DENSITOMETRY RESULTS: Site      Region     Measured Date Measured Age WHO Classification Young Adult T-score BMD         %Change vs. Previous Significant Change (*) AP Spine L1-L4 06/30/2022  71.0 Osteopenia -2.2 0.924 g/cm2 -0.3% - AP Spine L1-L4 02/26/2017 65.6 Osteopenia -2.2 0.927 g/cm2 - - DualFemur Neck Right 06/30/2022 71.0 Osteoporosis -2.5 0.690 g/cm2 -1.0% - DualFemur Neck Right 02/26/2017 65.6 Osteoporosis -2.5 0.697 g/cm2 - - DualFemur Total Mean 06/30/2022 71.0 Osteopenia -2.1 0.745 g/cm2 -3.6% Yes DualFemur Total Mean 02/26/2017 65.6 Osteopenia -1.9 0.773 g/cm2 - - ASSESSMENT: The BMD measured at Femur Neck Right is 0.690 g/cm2 with a T-score of -2.5. This patient is considered osteoporotic according to [facility]) criteria. Compared with prior study, there has been no significant change in the spine. Compared with prior study, there has been no significant change in the right hip. Compared with prior study, there has been significant decrease in the total mean. Patient is not a candidate for FRAX due to diagnosis of osteoporosis. The scan quality is good. [facility]) criteria for post-menopausal, Caucasian Women: Normal:                   T-score at or above -1 SD Osteopenia/low bone mass: T-score between -1 and -2.5 SD Osteoporosis:             T-score at or below -2.5 SD RECOMMENDATIONS: 1. All patients should optimize calcium and vitamin D intake. 2. Consider FDA-approved medical therapies in postmenopausal women and men aged 60 years and older, based on the following: a. A hip or vertebral(clinical or morphometric) fracture b. T-score < -2.5 at the femoral neck or spine after appropriate evaluation to exclude secondary  causes c. Low bone mass (T-score between -1.0 and -2.5 at the femoral neck or spine) and a 10-year probability of a hip fracture > 3% or a 10-year probability of a major osteoporosis-related fracture > 20% based on the [place]-adapted WHO algorithm 3. Clinician judgment and/or patient preferences may indicate treatment for people with 10-year fracture probabilities above or below these levels FOLLOW-UP: People with diagnosed cases of  osteoporosis or at high risk for fracture should have regular bone mineral density tests. For patients eligible for Medicare, routine testing is allowed once every 2 years. The testing frequency can be increased to one year for patients who have rapidly progressing disease, those who are receiving or discontinuing medical therapy to restore bone mass, or have additional risk factors. I have reviewed this report, and agree with the above findings. [facility] Electronically Signed   By: Ammie Ferrier M.D.   On: 06/30/2022 15:39       Assessment & Plan:   Problem List Items Addressed This Visit     Anxiety    On zoloft.  Stable.       Aortic atherosclerosis (HCC)    Continue lipitor.       CAD (coronary artery disease)    Recent CT chest - calcification in aorta and Left main and 3 vessel disease.  Have discussed risk factor modification. have discussed the need to quit smoking.  Family history.  Saw cardiology.  myoview unremarkable.  ECHO with preserved LV function.  Continue medical management.       Carotid artery disease ([place])    Life line screening - 10/21/21 - mild carotid artery disease.  Continue statin and adequate blood pressure control.  Follow.       Colon cancer screening    Saw GI. Planning colonoscopy.       Early satiety    Describes early satiety, some indigestion and weight loss.  Planning for colonoscopy.  On nexium. Discuss with GI regarding need for EGD.        Emphysema lung (HCC)    No increased cough or congestion.  Breathing overall stable.  Discussed pulmonary referral.  Discussed PFTs.  Will notify me when agreeable.       Hypercholesterolemia - Primary    Continue lipitor.  Low cholesterol diet and exercise.  Follow lipid panel and liver function tests.        Hypothyroidism    On synthroid.  Elevated tsh.  Adjust synthroid.  Follow tsh.        Relevant Medications   levothyroxine (SYNTHROID) 125 MCG tablet   Other Relevant  Orders   TSH   LLQ pain    Left lower quadrant pain with associated early satiety and weight loss.  Check pelvic ultrasound.        Personal history of tobacco use, presenting hazards to health    Discussed the need to quit smoking.  She desires not to quit at this time.  Follow.       Renal cyst    Was evaluated (08/2021) - Dr Erlene Quan - recommended f/u in 2 years.          Einar Pheasant, MD

## 2022-08-12 ENCOUNTER — Other Ambulatory Visit: Payer: Self-pay | Admitting: Internal Medicine

## 2022-08-17 ENCOUNTER — Encounter: Payer: Self-pay | Admitting: Internal Medicine

## 2022-08-17 DIAGNOSIS — R1032 Left lower quadrant pain: Secondary | ICD-10-CM | POA: Insufficient documentation

## 2022-08-17 DIAGNOSIS — R6881 Early satiety: Secondary | ICD-10-CM | POA: Insufficient documentation

## 2022-08-17 NOTE — Assessment & Plan Note (Signed)
Describes early satiety, some indigestion and weight loss.  Planning for colonoscopy.  On nexium. Discuss with GI regarding need for EGD.

## 2022-08-17 NOTE — Assessment & Plan Note (Signed)
Continue lipitor  ?

## 2022-08-17 NOTE — Assessment & Plan Note (Addendum)
On synthroid.  Elevated tsh.  Adjust synthroid.  Follow tsh.

## 2022-08-17 NOTE — Assessment & Plan Note (Signed)
On zoloft.  Stable.

## 2022-08-17 NOTE — Assessment & Plan Note (Signed)
No increased cough or congestion.  Breathing overall stable.  Discussed pulmonary referral.  Discussed PFTs.  Will notify me when agreeable.

## 2022-08-17 NOTE — Assessment & Plan Note (Signed)
Life line screening - 10/21/21 - mild carotid artery disease.  Continue statin and adequate blood pressure control.  Follow.

## 2022-08-17 NOTE — Assessment & Plan Note (Signed)
Continue lipitor.  Low cholesterol diet and exercise.  Follow lipid panel and liver function tests.   

## 2022-08-17 NOTE — Assessment & Plan Note (Addendum)
Was evaluated (08/2021) - Dr Erlene Quan - recommended f/u in 2 years.

## 2022-08-17 NOTE — Assessment & Plan Note (Signed)
Recent CT chest - calcification in aorta and Left main and 3 vessel disease.  Have discussed risk factor modification. have discussed the need to quit smoking.  Family history.  Saw cardiology.  myoview unremarkable.  ECHO with preserved LV function.  Continue medical management.

## 2022-08-17 NOTE — Assessment & Plan Note (Signed)
Discussed the need to quit smoking.  She desires not to quit at this time.  Follow.

## 2022-08-17 NOTE — Assessment & Plan Note (Signed)
Saw GI. Planning colonoscopy.

## 2022-08-17 NOTE — Assessment & Plan Note (Signed)
Left lower quadrant pain with associated early satiety and weight loss.  Check pelvic ultrasound.

## 2022-08-28 ENCOUNTER — Telehealth: Payer: Self-pay | Admitting: Internal Medicine

## 2022-08-28 NOTE — Telephone Encounter (Signed)
Lm for pt to cb.

## 2022-08-28 NOTE — Telephone Encounter (Signed)
Rhonda from GI called stating they have added on the upper endoscopy and to let the pt know

## 2022-09-08 ENCOUNTER — Encounter: Payer: Self-pay | Admitting: Internal Medicine

## 2022-09-09 NOTE — Telephone Encounter (Signed)
Agree with need for evaluation today given sob, etc.   Need to confirm evaluated .

## 2022-09-10 ENCOUNTER — Ambulatory Visit (INDEPENDENT_AMBULATORY_CARE_PROVIDER_SITE_OTHER): Payer: Medicare Other

## 2022-09-10 ENCOUNTER — Ambulatory Visit
Admission: RE | Admit: 2022-09-10 | Discharge: 2022-09-10 | Disposition: A | Discharge status: Home / Self Care | Payer: Medicare Other | Source: Ambulatory Visit

## 2022-09-10 VITALS — BP 107/59 | HR 63 | Temp 98.1°F | Resp 16

## 2022-09-10 DIAGNOSIS — R059 Cough, unspecified: Secondary | ICD-10-CM

## 2022-09-10 DIAGNOSIS — J189 Pneumonia, unspecified organism: Secondary | ICD-10-CM

## 2022-09-10 MED ORDER — LEVOFLOXACIN 750 MG PO TABS: 750.0000 mg | ORAL_TABLET | Freq: Every day | ORAL | 0 refills | Status: AC

## 2022-09-10 NOTE — Discharge Instructions (Addendum)
Your chest xray showed consolidated in your lungs which may be an infection. You are being treated with levaquin because of your intolerance to penicillins.  Follow up here or with your primary care provider if your symptoms are worsening or not improving with treatment.

## 2022-09-10 NOTE — ED Provider Notes (Signed)
Roderic Palau    CSN: [id number] Arrival date & time: 09/10/22  1056      History   Chief Complaint Chief Complaint  Patient presents with   Cough    Wheezing, shortness of breath, cough - Entered by patient   Shortness of Breath    HPI Caterina Garlick is a 71 y.o. female.    Cough Associated symptoms: shortness of breath   Shortness of Breath Associated symptoms: cough     Presents to urgent care with complaint of cough, chest "discomfort", nasal drainage x 3 weeks.  She complains of wheezing, shortness of breath associated with cough.  She describes the "chest discomfort" as a persistent pain in the upper left of her chest with some pain in her axilla.  Pain is not worsened by coughing, palpation or by exertion.  Ever present.  She denies fever, myalgias, chills.  Endorses fatigue.  Past Medical History:  Diagnosis Date   Allergy    Hyperlipidemia    Thyroid disease     Patient Active Problem List   Diagnosis Date Noted   LLQ pain 08/17/2022   Early satiety 08/17/2022   Breast cancer screening 04/05/2022   Rib fractures 04/05/2022   Cough 11/10/2021   Carotid artery disease ([place]) 11/10/2021   CAD (coronary artery disease) 03/02/2021   Emphysema lung ([place]) 03/02/2021   B12 deficiency 03/02/2021   Headache 10/27/2020   Intractable episodic cluster headache 08/06/2020   TMJ arthralgia 07/15/2020   Renal cyst 04/08/2020   Cluster headache 02/11/2020   Irregular heart rhythm 12/22/2019   Skin abrasion 04/25/2019   Low back pain 12/12/2018   Aortic atherosclerosis ([place]) 03/29/2018   Stress 03/29/2018   Difficulty sleeping 12/26/2017   Abnormal chest CT 11/16/2017   Vertigo 08/11/2017   Osteopenia 05/22/2017   Concussion 05/22/2017   Anxiety 05/22/2017   Healthcare maintenance 11/24/2016   SOB (shortness of breath) 11/24/2016   Personal history of tobacco use, presenting hazards to health 11/12/2016   Tobacco abuse 08/31/2016   Environmental  allergies 08/31/2016   Hypothyroidism 08/31/2016   Colon cancer screening 08/31/2016   History of abnormal cervical Pap smear 08/31/2016   Hypercholesterolemia 08/21/2016    Past Surgical History:  Procedure Laterality Date   ABDOMINAL HYSTERECTOMY     previous abnormal pap smear   BREAST BIOPSY     TONSILLECTOMY      OB History   No obstetric history on file.      Home Medications    Prior to Admission medications   Medication Sig Start Date End Date Taking? Authorizing Provider  Na Sulfate-K Sulfate-Mg Sulf 17.5-3.13-1.6 GM/177ML SOLN Take by mouth. 07/25/22  Yes [provider]  albuterol (VENTOLIN HFA) 108 (90 Base) MCG/ACT inhaler Inhale 2 puffs into the lungs every 6 (six) hours as needed for wheezing or shortness of breath. 11/04/21   Einar Pheasant, MD  aspirin EC 81 MG tablet Take 1 tablet (81 mg total) by mouth daily. Swallow whole. 03/02/21   Einar Pheasant, MD  atorvastatin (LIPITOR) 20 MG tablet TAKE 1 TABLET BY MOUTH 4 days a week, and 2 tablets by mouth one day per week at bedtime for cholesterol. 04/04/22   Einar Pheasant, MD  cholecalciferol (VITAMIN D) 1000 units tablet Take 2,000 Units by mouth daily.    [provider]  Cyanocobalamin (VITAMIN B12) 1000 MCG TBCR  07/25/20   [provider]  Esomeprazole Magnesium (NEXIUM PO) Take by mouth.    [provider]  fluticasone (FLONASE) 50 MCG/ACT nasal spray Place 2 sprays into both nostrils daily.    [provider]  levothyroxine (SYNTHROID) 125 MCG tablet Take 1 tablet (125 mcg total) by mouth daily. 08/07/22   Einar Pheasant, MD  sertraline (ZOLOFT) 25 MG tablet TAKE (1) OR (2) TABLETS BY MOUTH DAILY. 08/12/22   Einar Pheasant, MD  verapamil (CALAN-SR) 180 MG CR tablet Take 180 mg by mouth daily. 11/01/21   [provider]    Family History Family History  Problem Relation Age of Onset   Arthritis Mother    Stroke Mother    Hypertension Mother     Arthritis Father    Heart disease Father    Heart disease Brother    Cancer Maternal Aunt        breast    Social History Social History   Tobacco Use   Smoking status: Every Day    Packs/day: 1.00    Years: 47.00    Total pack years: 47.00    Types: Cigarettes   Smokeless tobacco: Never  Substance Use Topics   Alcohol use: Yes    Alcohol/week: 7.0 standard drinks of alcohol    Types: 7 Glasses of wine per week    Comment: 1 glass of wine per night     Allergies   Actonel [risedronate sodium] and Penicillins   Review of Systems Review of Systems  Respiratory:  Positive for cough and shortness of breath.      Physical Exam Triage Vital Signs ED Triage Vitals [09/10/22 1133]  Enc Vitals Group     BP      Pulse      Resp      Temp      Temp src      SpO2      Weight      Height      Head Circumference      Peak Flow      Pain Score 2     Pain Loc      Pain Edu?      Excl. in [place]?    No data found.  Updated Vital Signs There were no vitals taken for this visit.  Visual Acuity Right Eye Distance:   Left Eye Distance:   Bilateral Distance:    Right Eye Near:   Left Eye Near:    Bilateral Near:     Physical Exam Vitals reviewed.  Constitutional:      Appearance: She is well-developed.  Cardiovascular:     Rate and Rhythm: Normal rate and regular rhythm.  Pulmonary:     Effort: Pulmonary effort is normal.     Breath sounds: Decreased breath sounds present. No wheezing or rhonchi.  Skin:    General: Skin is warm and dry.  Neurological:     General: No focal deficit present.     Mental Status: She is alert and oriented to person, place, and time.  Psychiatric:        Mood and Affect: Mood normal.        Behavior: Behavior normal.     UC Treatments / Results  Labs (all labs ordered are listed, but only abnormal results are displayed) Labs Reviewed - No data to display  EKG   Radiology No results found.  Procedures Procedures  (including critical care time)  Medications Ordered in UC Medications - No data to display  Initial Impression / Assessment and Plan / UC Course  I have reviewed the triage  vital signs and the nursing notes.  Pertinent labs & imaging results that were available during my care of the patient were reviewed by me and considered in my medical decision making (see chart for details).   Patient is afebrile here without recent antipyretics. Satting well on room air. Overall is not ill appearing, well hydrated, without respiratory distress. Pulmonary exam is remarkable for possible decreased breath sounds.  No rhonchi or rales or wheezing.   Chest x-ray shows consolidation which may be atypical infection. Will treat with levaquin given PCN intolerance and does not know if she tolerates cephalosporins.  Final Clinical Impressions(s) / UC Diagnoses   Final diagnoses:  None   Discharge Instructions   None    ED Prescriptions   None    PDMP not reviewed this encounter.   Rose Phi, [place] 09/10/22 1307

## 2022-09-10 NOTE — ED Triage Notes (Signed)
Pt. Presents to UC w/ c/o a cough, chest discomfort and nasal drainage for the past 3 weeks.

## 2022-09-25 ENCOUNTER — Other Ambulatory Visit (INDEPENDENT_AMBULATORY_CARE_PROVIDER_SITE_OTHER): Payer: Medicare Other

## 2022-09-25 DIAGNOSIS — E039 Hypothyroidism, unspecified: Secondary | ICD-10-CM

## 2022-09-25 LAB — TSH: TSH: 0.27 u[IU]/mL — ABNORMAL LOW (ref 0.35–5.50)

## 2022-09-26 ENCOUNTER — Other Ambulatory Visit: Payer: Self-pay | Admitting: *Deleted

## 2022-09-26 DIAGNOSIS — E039 Hypothyroidism, unspecified: Secondary | ICD-10-CM

## 2022-09-26 MED ORDER — LEVOTHYROXINE SODIUM 112 MCG PO TABS: 112.0000 ug | ORAL_TABLET | Freq: Every day | ORAL | 1 refills | Status: DC

## 2022-10-21 DIAGNOSIS — R634 Abnormal weight loss: Secondary | ICD-10-CM | POA: Diagnosis not present

## 2022-10-21 DIAGNOSIS — Z1211 Encounter for screening for malignant neoplasm of colon: Secondary | ICD-10-CM | POA: Diagnosis not present

## 2022-10-21 DIAGNOSIS — D120 Benign neoplasm of cecum: Secondary | ICD-10-CM | POA: Diagnosis not present

## 2022-10-21 DIAGNOSIS — K2289 Other specified disease of esophagus: Secondary | ICD-10-CM | POA: Diagnosis not present

## 2022-10-21 DIAGNOSIS — R6881 Early satiety: Secondary | ICD-10-CM | POA: Diagnosis not present

## 2022-10-21 DIAGNOSIS — K2950 Unspecified chronic gastritis without bleeding: Secondary | ICD-10-CM | POA: Diagnosis not present

## 2022-10-21 DIAGNOSIS — K2970 Gastritis, unspecified, without bleeding: Secondary | ICD-10-CM | POA: Diagnosis not present

## 2022-10-21 DIAGNOSIS — K219 Gastro-esophageal reflux disease without esophagitis: Secondary | ICD-10-CM | POA: Diagnosis not present

## 2022-10-21 DIAGNOSIS — K640 First degree hemorrhoids: Secondary | ICD-10-CM | POA: Diagnosis not present

## 2022-10-26 DIAGNOSIS — M47818 Spondylosis without myelopathy or radiculopathy, sacral and sacrococcygeal region: Secondary | ICD-10-CM | POA: Diagnosis not present

## 2022-10-26 DIAGNOSIS — M5137 Other intervertebral disc degeneration, lumbosacral region: Secondary | ICD-10-CM | POA: Diagnosis not present

## 2022-10-26 DIAGNOSIS — M47816 Spondylosis without myelopathy or radiculopathy, lumbar region: Secondary | ICD-10-CM | POA: Diagnosis not present

## 2022-10-26 DIAGNOSIS — M545 Low back pain, unspecified: Secondary | ICD-10-CM | POA: Diagnosis not present

## 2022-10-26 DIAGNOSIS — M5136 Other intervertebral disc degeneration, lumbar region: Secondary | ICD-10-CM | POA: Diagnosis not present

## 2022-10-26 DIAGNOSIS — M4856XA Collapsed vertebra, not elsewhere classified, lumbar region, initial encounter for fracture: Secondary | ICD-10-CM | POA: Diagnosis not present

## 2022-10-31 DIAGNOSIS — R002 Palpitations: Secondary | ICD-10-CM | POA: Diagnosis not present

## 2022-10-31 DIAGNOSIS — I499 Cardiac arrhythmia, unspecified: Secondary | ICD-10-CM | POA: Diagnosis not present

## 2022-10-31 DIAGNOSIS — G44011 Episodic cluster headache, intractable: Secondary | ICD-10-CM | POA: Diagnosis not present

## 2022-11-03 ENCOUNTER — Encounter: Payer: Self-pay | Admitting: Internal Medicine

## 2022-11-03 DIAGNOSIS — M810 Age-related osteoporosis without current pathological fracture: Secondary | ICD-10-CM

## 2022-11-03 DIAGNOSIS — G44019 Episodic cluster headache, not intractable: Secondary | ICD-10-CM

## 2022-11-03 DIAGNOSIS — E538 Deficiency of other specified B group vitamins: Secondary | ICD-10-CM

## 2022-11-03 NOTE — Telephone Encounter (Signed)
Orders placed for b12 and vitamin  D

## 2022-11-03 NOTE — Telephone Encounter (Signed)
Ok to add Vit D to labs ?

## 2022-11-06 ENCOUNTER — Ambulatory Visit (INDEPENDENT_AMBULATORY_CARE_PROVIDER_SITE_OTHER): Payer: Medicare Other

## 2022-11-06 VITALS — Ht 60.0 in | Wt 106.0 lb

## 2022-11-06 DIAGNOSIS — Z0000 Encounter for general adult medical examination without abnormal findings: Secondary | ICD-10-CM | POA: Diagnosis not present

## 2022-11-06 NOTE — Patient Instructions (Addendum)
Leah Benitez , Thank you for taking time to come for your Medicare Wellness Visit. I appreciate your ongoing commitment to your health goals. Please review the following plan we discussed and let me know if I can assist you in the future.   These are the goals we discussed:  Goals       Patient Stated     Increase physical activity (pt-stated)      Walk for exercise Quit smoking        This is a list of the screening recommended for you and due dates:  Health Maintenance  Topic Date Due   COVID-19 Vaccine (6 - 2023-24 season) 06/13/2022   Zoster (Shingles) Vaccine (1 of 2) 02/05/2023*   Screening for Lung Cancer  02/26/2023   Medicare Annual Wellness Visit  11/07/2023   Mammogram  05/02/2024   DTaP/Tdap/Td vaccine (2 - Td or Tdap) 01/20/2027   Colon Cancer Screening  10/21/2032   Pneumonia Vaccine  Completed   Flu Shot  Completed   DEXA scan (bone density measurement)  Completed   Hepatitis C Screening: USPSTF Recommendation to screen - Ages 70-79 yo.  Completed   HPV Vaccine  Aged Out  *Topic was postponed. The date shown is not the original due date.    Advanced directives: not yet completed  Conditions/risks identified: none new  Next appointment: Follow up in one year for your annual wellness visit    Preventive Care 65 Years and Older, Female Preventive care refers to lifestyle choices and visits with your health care provider that can promote health and wellness. What does preventive care include? A yearly physical exam. This is also called an annual well check. Dental exams once or twice a year. Routine eye exams. Ask your health care provider how often you should have your eyes checked. Personal lifestyle choices, including: Daily care of your teeth and gums. Regular physical activity. Eating a healthy diet. Avoiding tobacco and drug use. Limiting alcohol use. Practicing safe sex. Taking low-dose aspirin every day. Taking vitamin and mineral supplements as  recommended by your health care provider. What happens during an annual well check? The services and screenings done by your health care provider during your annual well check will depend on your age, overall health, lifestyle risk factors, and family history of disease. Counseling  Your health care provider may ask you questions about your: Alcohol use. Tobacco use. Drug use. Emotional well-being. Home and relationship well-being. Sexual activity. Eating habits. History of falls. Memory and ability to understand (cognition). Work and work Statistician. Reproductive health. Screening  You may have the following tests or measurements: Height, weight, and BMI. Blood pressure. Lipid and cholesterol levels. These may be checked every 5 years, or more frequently if you are over 63 years old. Skin check. Lung cancer screening. You may have this screening every year starting at age 8 if you have a 30-pack-year history of smoking and currently smoke or have quit within the past 15 years. Fecal occult blood test (FOBT) of the stool. You may have this test every year starting at age 15. Flexible sigmoidoscopy or colonoscopy. You may have a sigmoidoscopy every 5 years or a colonoscopy every 10 years starting at age 50. Hepatitis C blood test. Hepatitis B blood test. Sexually transmitted disease (STD) testing. Diabetes screening. This is done by checking your blood sugar (glucose) after you have not eaten for a while (fasting). You may have this done every 1-3 years. Bone density scan. This is done  to screen for osteoporosis. You may have this done starting at age 65. Mammogram. This may be done every 1-2 years. Talk to your health care provider about how often you should have regular mammograms. Talk with your health care provider about your test results, treatment options, and if necessary, the need for more tests. Vaccines  Your health care provider may recommend certain vaccines, such  as: Influenza vaccine. This is recommended every year. Tetanus, diphtheria, and acellular pertussis (Tdap, Td) vaccine. You may need a Td booster every 10 years. Zoster vaccine. You may need this after age 90. Pneumococcal 13-valent conjugate (PCV13) vaccine. One dose is recommended after age 40. Pneumococcal polysaccharide (PPSV23) vaccine. One dose is recommended after age 65. Talk to your health care provider about which screenings and vaccines you need and how often you need them. This information is not intended to replace advice given to you by your health care provider. Make sure you discuss any questions you have with your health care provider. Document Released: 10/26/2015 Document Revised: 06/18/2016 Document Reviewed: 07/31/2015 Elsevier Interactive Patient Education  2017 [place] Prevention in the Home Falls can cause injuries. They can happen to people of all ages. There are many things you can do to make your home safe and to help prevent falls. What can I do on the outside of my home? Regularly fix the edges of walkways and driveways and fix any cracks. Remove anything that might make you trip as you walk through a door, such as a raised step or threshold. Trim any bushes or trees on the path to your home. Use bright outdoor lighting. Clear any walking paths of anything that might make someone trip, such as rocks or tools. Regularly check to see if handrails are loose or broken. Make sure that both sides of any steps have handrails. Any raised decks and porches should have guardrails on the edges. Have any leaves, snow, or ice cleared regularly. Use sand or salt on walking paths during winter. Clean up any spills in your garage right away. This includes oil or grease spills. What can I do in the bathroom? Use night lights. Install grab bars by the toilet and in the tub and shower. Do not use towel bars as grab bars. Use non-skid mats or decals in the tub or  shower. If you need to sit down in the shower, use a plastic, non-slip stool. Keep the floor dry. Clean up any water that spills on the floor as soon as it happens. Remove soap buildup in the tub or shower regularly. Attach bath mats securely with double-sided non-slip rug tape. Do not have throw rugs and other things on the floor that can make you trip. What can I do in the bedroom? Use night lights. Make sure that you have a light by your bed that is easy to reach. Do not use any sheets or blankets that are too big for your bed. They should not hang down onto the floor. Have a firm chair that has side arms. You can use this for support while you get dressed. Do not have throw rugs and other things on the floor that can make you trip. What can I do in the kitchen? Clean up any spills right away. Avoid walking on wet floors. Keep items that you use a lot in easy-to-reach places. If you need to reach something above you, use a strong step stool that has a grab bar. Keep electrical cords out of the  way. Do not use floor polish or wax that makes floors slippery. If you must use wax, use non-skid floor wax. Do not have throw rugs and other things on the floor that can make you trip. What can I do with my stairs? Do not leave any items on the stairs. Make sure that there are handrails on both sides of the stairs and use them. Fix handrails that are broken or loose. Make sure that handrails are as long as the stairways. Check any carpeting to make sure that it is firmly attached to the stairs. Fix any carpet that is loose or worn. Avoid having throw rugs at the top or bottom of the stairs. If you do have throw rugs, attach them to the floor with carpet tape. Make sure that you have a light switch at the top of the stairs and the bottom of the stairs. If you do not have them, ask someone to add them for you. What else can I do to help prevent falls? Wear shoes that: Do not have high heels. Have  rubber bottoms. Are comfortable and fit you well. Are closed at the toe. Do not wear sandals. If you use a stepladder: Make sure that it is fully opened. Do not climb a closed stepladder. Make sure that both sides of the stepladder are locked into place. Ask someone to hold it for you, if possible. Clearly mark and make sure that you can see: Any grab bars or handrails. First and last steps. Where the edge of each step is. Use tools that help you move around (mobility aids) if they are needed. These include: Canes. Walkers. Scooters. Crutches. Turn on the lights when you go into a dark area. Replace any light bulbs as soon as they burn out. Set up your furniture so you have a clear path. Avoid moving your furniture around. If any of your floors are uneven, fix them. If there are any pets around you, be aware of where they are. Review your medicines with your doctor. Some medicines can make you feel dizzy. This can increase your chance of falling. Ask your doctor what other things that you can do to help prevent falls. This information is not intended to replace advice given to you by your health care provider. Make sure you discuss any questions you have with your health care provider. Document Released: 07/26/2009 Document Revised: 03/06/2016 Document Reviewed: 11/03/2014 Elsevier Interactive Patient Education  2017 [employer].

## 2022-11-06 NOTE — Progress Notes (Signed)
Subjective:   Leah Benitez is a 72 y.o. female who presents for Medicare Annual (Subsequent) preventive examination.  Review of Systems    No ROS.  Medicare Wellness Virtual Visit.  Visual/audio telehealth visit, UTA vital signs.   See social history for additional risk factors.   Cardiac Risk Factors include: advanced age (>45mn, >>5women)     Objective:    Today's Vitals   11/06/22 0857  Weight: 106 lb (48.1 kg)  Height: 5' (1.524 m)   Body mass index is 20.7 kg/m.     11/06/2022    8:52 AM 10/25/2021   12:38 PM 10/16/2020   11:15 AM 02/02/2019    9:50 AM  Advanced Directives  Does Patient Have a Medical Advance Directive? No No No No  Would patient like information on creating a medical advance directive? No - Patient declined No - Patient declined No - Patient declined Yes (MAU/Ambulatory/Procedural Areas - Information given)    Current Medications (verified) Outpatient Encounter Medications as of 11/06/2022  Medication Sig   albuterol (VENTOLIN HFA) 108 (90 Base) MCG/ACT inhaler Inhale 2 puffs into the lungs every 6 (six) hours as needed for wheezing or shortness of breath.   aspirin EC 81 MG tablet Take 1 tablet (81 mg total) by mouth daily. Swallow whole.   atorvastatin (LIPITOR) 20 MG tablet TAKE 1 TABLET BY MOUTH 4 days a week, and 2 tablets by mouth one day per week at bedtime for cholesterol.   cholecalciferol (VITAMIN D) 1000 units tablet Take 2,000 Units by mouth daily.   Cyanocobalamin (VITAMIN B12) 1000 MCG TBCR    Esomeprazole Magnesium (NEXIUM PO) Take by mouth.   fluticasone (FLONASE) 50 MCG/ACT nasal spray Place 2 sprays into both nostrils daily.   levothyroxine (SYNTHROID) 112 MCG tablet Take 1 tablet (112 mcg total) by mouth daily.   Na Sulfate-K Sulfate-Mg Sulf 17.5-3.13-1.6 GM/177ML SOLN Take by mouth.   sertraline (ZOLOFT) 25 MG tablet TAKE (1) OR (2) TABLETS BY MOUTH DAILY.   verapamil (CALAN-SR) 180 MG CR tablet Take 180 mg by mouth daily.   No  facility-administered encounter medications on file as of 11/06/2022.    Allergies (verified) Actonel [risedronate sodium] and Penicillins   History: Past Medical History:  Diagnosis Date   Allergy    Hyperlipidemia    Thyroid disease    Past Surgical History:  Procedure Laterality Date   ABDOMINAL HYSTERECTOMY     previous abnormal pap smear   BREAST BIOPSY     TONSILLECTOMY     Family History  Problem Relation Age of Onset   Arthritis Mother    Stroke Mother    Hypertension Mother    Arthritis Father    Heart disease Father    Heart disease Brother    Cancer Maternal Aunt        breast   Social History   Socioeconomic History   Marital status: Married    Spouse name: Not on file   Number of children: Not on file   Years of education: Not on file   Highest education level: Not on file  Occupational History   Not on file  Tobacco Use   Smoking status: Every Day    Packs/day: 1.00    Years: 47.00    Total pack years: 47.00    Types: Cigarettes   Smokeless tobacco: Never  Substance and Sexual Activity   Alcohol use: Yes    Alcohol/week: 7.0 standard drinks of alcohol    Types:  7 Glasses of wine per week    Comment: 1 glass of wine per night   Drug use: Not on file   Sexual activity: Not on file  Other Topics Concern   Not on file  Social History Narrative   Not on file   Social Determinants of Health   Financial Resource Strain: Low Risk  (11/06/2022)   Overall Financial Resource Strain (CARDIA)    Difficulty of Paying Living Expenses: Not hard at all  Food Insecurity: No Food Insecurity (11/06/2022)   Hunger Vital Sign    Worried About Running Out of Food in the Last Year: Never true    Ran Out of Food in the Last Year: Never true  Transportation Needs: No Transportation Needs (11/06/2022)   PRAPARE - Hydrologist (Medical): No    Lack of Transportation (Non-Medical): No  Physical Activity: Not on file  Stress: No Stress  Concern Present (11/06/2022)   [place]    Feeling of Stress : Only a little  Social Connections: Unknown (11/06/2022)   Social Connection and Isolation Panel [NHANES]    Frequency of Communication with Friends and Family: More than three times a week    Frequency of Social Gatherings with Friends and Family: More than three times a week    Attends Religious Services: Not on Advertising copywriter or Organizations: Not on file    Attends Archivist Meetings: Not on file    Marital Status: Married    Tobacco Counseling Ready to quit: Not Answered Counseling given: Not Answered   Clinical Intake:  Pre-visit preparation completed: Yes        Diabetes: No  How often do you need to have someone help you when you read instructions, pamphlets, or other written materials from your doctor or pharmacy?: 1 - Never    Interpreter Needed?: No      Activities of Daily Living    11/06/2022    8:53 AM  In your present state of health, do you have any difficulty performing the following activities:  Hearing? 0  Vision? 0  Difficulty concentrating or making decisions? 0  Walking or climbing stairs? 0  Comment Paces self with activity  Dressing or bathing? 0  Doing errands, shopping? 0  Preparing Food and eating ? N  Using the Toilet? N  In the past six months, have you accidently leaked urine? N  Comment Managed with daily liner  Do you have problems with loss of bowel control? N  Managing your Medications? N  Managing your Finances? N  Housekeeping or managing your Housekeeping? N    Patient Care Team: Einar Pheasant, MD as PCP - General (Internal Medicine)  Indicate any recent Medical Services you may have received from other than [facility] providers in the past year (date may be approximate).     Assessment:   This is a routine wellness examination for Leah Benitez.  I connected with  Yatziri  Laidlaw on 11/06/22 by a audio enabled telemedicine application and verified that I am speaking with the correct person using two identifiers.  Patient Location: Home  Provider Location: Office/Clinic  I discussed the limitations of evaluation and management by telemedicine. The patient expressed understanding and agreed to proceed.   Hearing/Vision screen Hearing Screening - Comments:: Patient is able to hear conversational tones without difficulty. No issues reported.  Vision Screening - Comments:: Followed by [employer]  Wears reader lenses She has regular follow up with her ophthalmologist    Dietary issues and exercise activities discussed: Current Exercise Habits: Home exercise routine, Intensity: Mild   Goals Addressed               This Visit's Progress     Patient Stated     Increase physical activity (pt-stated)        Walk for exercise Quit smoking       Depression Screen    11/06/2022    8:55 AM 08/07/2022   10:39 AM 04/04/2022    9:34 AM 10/25/2021   12:35 PM 07/04/2021    1:26 PM 10/16/2020   11:14 AM 04/05/2020    3:40 PM  PHQ 2/9 Scores  PHQ - 2 Score 0 1 1 0 0 0 0    Fall Risk    11/06/2022    8:56 AM 08/07/2022   10:39 AM 04/04/2022    9:33 AM 10/25/2021   12:38 PM 07/04/2021    1:26 PM  Fall Risk   Falls in the past year? 0 0 1 0 0  Number falls in past yr: 0 0 0 0 0  Injury with Fall? 0 0 1  0  Risk for fall due to :  No Fall Risks History of fall(s);Impaired balance/gait    Follow up Falls evaluation completed Falls evaluation completed Falls evaluation completed Falls evaluation completed Falls evaluation completed    [place]: Home free of loose throw rugs in walkways, pet beds, electrical cords, etc? Yes  Adequate lighting in your home to reduce risk of falls? Yes   ASSISTIVE DEVICES UTILIZED TO PREVENT FALLS: Life alert? No  Use of a cane, walker or w/c? No  Grab bars in the bathroom? Yes  Shower  chair or bench in shower? Yes  Elevated toilet seat or a handicapped toilet? No   TIMED UP AND GO: Was the test performed? No .   Cognitive Function:        11/06/2022    8:55 AM 10/16/2020   11:22 AM 02/02/2019    9:55 AM  6CIT Screen  What Year? 0 points 0 points 0 points  What month? 0 points 0 points 0 points  What time? 0 points  0 points  Count back from 20 0 points  0 points  Months in reverse 0 points 0 points 0 points  Repeat phrase 0 points 0 points 0 points  Total Score 0 points  0 points    Immunizations Immunization History  Administered Date(s) Administered   Influenza, High Dose Seasonal PF 08/06/2022   Influenza,inj,quad, With Preservative 07/27/2019   Influenza-Unspecified 07/30/2018, 07/24/2020, 07/02/2021   Moderna Sars-Covid-2 Vaccination 02/21/2020, 03/23/2020, 09/11/2020, 05/15/2021, 08/07/2021   Pneumococcal Conjugate-13 10/31/2016   Pneumococcal Polysaccharide-23 08/16/2019   Tdap 01/19/2017   Shingrix Completed?: No.    Education has been provided regarding the importance of this vaccine. Patient has been advised to call insurance company to determine out of pocket expense if they have not yet received this vaccine. Advised may also receive vaccine at local pharmacy or Health Dept. Verbalized acceptance and understanding.  Screening Tests Health Maintenance  Topic Date Due   COVID-19 Vaccine (6 - 2023-24 season) 06/13/2022   Zoster Vaccines- Shingrix (1 of 2) 02/05/2023 (Originally 06/16/1970)   Lung Cancer Screening  02/26/2023   Medicare Annual Wellness (AWV)  11/07/2023   MAMMOGRAM  05/02/2024   DTaP/Tdap/Td (2 - Td or Tdap)  01/20/2027   COLONOSCOPY (Pts 45-39yr Insurance coverage will need to be confirmed)  10/21/2032   Pneumonia Vaccine 72 Years old  Completed   INFLUENZA VACCINE  Completed   DEXA SCAN  Completed   Hepatitis C Screening  Completed   HPV VACCINES  Aged Out    Health Maintenance Health Maintenance Due  Topic Date Due    COVID-19 Vaccine (6 - 2023-24 season) 06/13/2022   Lung Cancer Screening: completed 09/10/22.   Hepatitis C Screening: Completed 10/2020.  Vision Screening: Recommended annual ophthalmology exams for early detection of glaucoma and other disorders of the eye.  Dental Screening: Recommended annual dental exams for proper oral hygiene  Community Resource Referral / Chronic Care Management: CRR required this visit?  No   CCM required this visit?  No      Plan:     I have personally reviewed and noted the following in the patient's chart:   Medical and social history Use of alcohol, tobacco or illicit drugs  Current medications and supplements including opioid prescriptions. Patient is not currently taking opioid prescriptions. Functional ability and status Nutritional status Physical activity Advanced directives List of other physicians Hospitalizations, surgeries, and ER visits in previous 12 months Vitals Screenings to include cognitive, depression, and falls Referrals and appointments  In addition, I have reviewed and discussed with patient certain preventive protocols, quality metrics, and best practice recommendations. A written personalized care plan for preventive services as well as general preventive health recommendations were provided to patient.     DLeta Jungling LPN   [id number]/[id number]

## 2022-11-07 ENCOUNTER — Ambulatory Visit (INDEPENDENT_AMBULATORY_CARE_PROVIDER_SITE_OTHER): Payer: Medicare Other | Admitting: Internal Medicine

## 2022-11-07 VITALS — BP 138/62 | HR 80 | Temp 97.9°F | Resp 16 | Ht 60.0 in | Wt 103.6 lb

## 2022-11-07 DIAGNOSIS — M545 Low back pain, unspecified: Secondary | ICD-10-CM

## 2022-11-07 DIAGNOSIS — M810 Age-related osteoporosis without current pathological fracture: Secondary | ICD-10-CM | POA: Diagnosis not present

## 2022-11-07 DIAGNOSIS — E039 Hypothyroidism, unspecified: Secondary | ICD-10-CM

## 2022-11-07 DIAGNOSIS — Z122 Encounter for screening for malignant neoplasm of respiratory organs: Secondary | ICD-10-CM

## 2022-11-07 DIAGNOSIS — Z87891 Personal history of nicotine dependence: Secondary | ICD-10-CM | POA: Diagnosis not present

## 2022-11-07 DIAGNOSIS — I779 Disorder of arteries and arterioles, unspecified: Secondary | ICD-10-CM | POA: Diagnosis not present

## 2022-11-07 DIAGNOSIS — E7800 Pure hypercholesterolemia, unspecified: Secondary | ICD-10-CM

## 2022-11-07 DIAGNOSIS — G44019 Episodic cluster headache, not intractable: Secondary | ICD-10-CM

## 2022-11-07 DIAGNOSIS — F17210 Nicotine dependence, cigarettes, uncomplicated: Secondary | ICD-10-CM | POA: Diagnosis not present

## 2022-11-07 DIAGNOSIS — F439 Reaction to severe stress, unspecified: Secondary | ICD-10-CM

## 2022-11-07 DIAGNOSIS — N281 Cyst of kidney, acquired: Secondary | ICD-10-CM

## 2022-11-07 DIAGNOSIS — I2510 Atherosclerotic heart disease of native coronary artery without angina pectoris: Secondary | ICD-10-CM

## 2022-11-07 DIAGNOSIS — J432 Centrilobular emphysema: Secondary | ICD-10-CM

## 2022-11-07 DIAGNOSIS — F419 Anxiety disorder, unspecified: Secondary | ICD-10-CM

## 2022-11-07 DIAGNOSIS — E538 Deficiency of other specified B group vitamins: Secondary | ICD-10-CM

## 2022-11-07 DIAGNOSIS — I700 Atherosclerosis of aorta: Secondary | ICD-10-CM

## 2022-11-07 LAB — BASIC METABOLIC PANEL
BUN: 10 mg/dL (ref 6–23)
CO2: 29 mEq/L (ref 19–32)
Calcium: 9.8 mg/dL (ref 8.4–10.5)
Chloride: 97 mEq/L (ref 96–112)
Creatinine, Ser: 0.68 mg/dL (ref 0.40–1.20)
GFR: 87.64 mL/min (ref >60.00)
Glucose, Bld: 75 mg/dL (ref 70–99)
Potassium: 5 mEq/L (ref 3.5–5.1)
Sodium: 137 mEq/L (ref 135–145)

## 2022-11-07 LAB — VITAMIN D 25 HYDROXY (VIT D DEFICIENCY, FRACTURES): VITD: 45.22 ng/mL (ref 30.00–100.00)

## 2022-11-07 LAB — VITAMIN B12: Vitamin B-12: >1500 pg/mL — ABNORMAL HIGH (ref 211–911)

## 2022-11-07 LAB — TSH: TSH: 13.91 u[IU]/mL — ABNORMAL HIGH (ref 0.35–5.50)

## 2022-11-07 MED ORDER — METHYLPREDNISOLONE 4 MG PO TBPK: ORAL_TABLET | ORAL | 0 refills | Status: DC

## 2022-11-07 NOTE — Progress Notes (Unsigned)
Subjective:    Patient ID: Leah Benitez, female    DOB: [birth year], 72 y.o.   MRN: [id number]  Patient here for a scheduled follow up.  HPI Here to follow up regarding hypercholesterolemia.  Just saw neurology 10/31/22 - f/u cluster headaches. Recommended increasing verapamil to '180mg'$ .  Has '40mg'$  to take prn.  Continue high flow oxygen prn.  Has imitrex prn.  Main complaint today is low back pain.  She lifted a heavy sewing machine.  Injured her back.  Increased pain. Saw ortho 10/26/22 - low back pain - flexeril and steroid dosepak. Xray - old compression fracture - DDD.   No acute abnormality.  Still with increased pain.  Has improved some.  Limiting activity.  No pain radiating down her leg.  No numbness or tingling.  No chest pain or sob reported.  No increased cough or congestion. Taking ibuprofen.  Blood pressures 123-128/70s.  S/p colonoscopy 09/2022.  Two polyps removed.    Past Medical History:  Diagnosis Date   Allergy    Hyperlipidemia    Thyroid disease    Past Surgical History:  Procedure Laterality Date   ABDOMINAL HYSTERECTOMY     previous abnormal pap smear   BREAST BIOPSY     TONSILLECTOMY     Family History  Problem Relation Age of Onset   Arthritis Mother    Stroke Mother    Hypertension Mother    Arthritis Father    Heart disease Father    Heart disease Brother    Cancer Maternal Aunt        breast   Social History   Socioeconomic History   Marital status: Married    Spouse name: Not on file   Number of children: Not on file   Years of education: Not on file   Highest education level: Not on file  Occupational History   Not on file  Tobacco Use   Smoking status: Every Day    Packs/day: 1.00    Years: 47.00    Total pack years: 47.00    Types: Cigarettes   Smokeless tobacco: Never  Substance and Sexual Activity   Alcohol use: Yes    Alcohol/week: 7.0 standard drinks of alcohol    Types: 7 Glasses of wine per week    Comment: 1 glass of wine per  night   Drug use: Not on file   Sexual activity: Not on file  Other Topics Concern   Not on file  Social History Narrative   Not on file   Social Determinants of Health   Financial Resource Strain: Low Risk  (11/06/2022)   Overall Financial Resource Strain (CARDIA)    Difficulty of Paying Living Expenses: Not hard at all  Food Insecurity: No Food Insecurity (11/06/2022)   Hunger Vital Sign    Worried About Running Out of Food in the Last Year: Never true    Ran Out of Food in the Last Year: Never true  Transportation Needs: No Transportation Needs (11/06/2022)   PRAPARE - Hydrologist (Medical): No    Lack of Transportation (Non-Medical): No  Physical Activity: Not on file  Stress: No Stress Concern Present (11/06/2022)   [place]    Feeling of Stress : Only a little  Social Connections: Unknown (11/06/2022)   Social Connection and Isolation Panel [NHANES]    Frequency of Communication with Friends and Family: More than three times a week  Frequency of Social Gatherings with Friends and Family: More than three times a week    Attends Religious Services: Not on file    Active Member of Clubs or Organizations: Not on file    Attends Archivist Meetings: Not on file    Marital Status: Married     Review of Systems  Constitutional:  Negative for appetite change and unexpected weight change.  HENT:  Negative for congestion and sinus pressure.   Respiratory:  Negative for cough, chest tightness and shortness of breath.   Cardiovascular:  Negative for chest pain, palpitations and leg swelling.  Gastrointestinal:  Negative for abdominal pain, diarrhea, nausea and vomiting.  Genitourinary:  Negative for difficulty urinating and dysuria.  Musculoskeletal:  Positive for back pain. Negative for joint swelling.  Skin:  Negative for color change and rash.  Neurological:  Negative for  dizziness and headaches.  Psychiatric/Behavioral:  Negative for agitation and dysphoric mood.        Objective:     BP 138/62   Pulse 80   Temp 97.9 F (36.6 C)   Resp 16   Ht 5' (1.524 m)   Wt 103 lb 9.6 oz (47 kg)   SpO2 97%   BMI 20.23 kg/m  Wt Readings from Last 3 Encounters:  11/07/22 103 lb 9.6 oz (47 kg)  11/06/22 106 lb (48.1 kg)  08/07/22 106 lb 12.8 oz (48.4 kg)    Physical Exam Vitals reviewed.  Constitutional:      General: She is not in acute distress.    Appearance: Normal appearance.  HENT:     Head: Normocephalic and atraumatic.     Right Ear: External ear normal.     Left Ear: External ear normal.  Eyes:     General: No scleral icterus.       Right eye: No discharge.        Left eye: No discharge.     Conjunctiva/sclera: Conjunctivae normal.  Neck:     Thyroid: No thyromegaly.  Cardiovascular:     Rate and Rhythm: Normal rate and regular rhythm.  Pulmonary:     Effort: No respiratory distress.     Breath sounds: Normal breath sounds. No wheezing.  Abdominal:     General: Bowel sounds are normal.     Palpations: Abdomen is soft.     Tenderness: There is no abdominal tenderness.  Musculoskeletal:        General: No swelling.     Cervical back: Neck supple. No tenderness.     Comments: Increased pain - low back.  Some increased with pain with resistance - hip.  Negative SLR.   Lymphadenopathy:     Cervical: No cervical adenopathy.  Skin:    Findings: No erythema or rash.  Neurological:     Mental Status: She is alert.  Psychiatric:        Mood and Affect: Mood normal.        Behavior: Behavior normal.      Outpatient Encounter Medications as of 11/07/2022  Medication Sig   cyclobenzaprine (FLEXERIL) 5 MG tablet Take 5 mg by mouth at bedtime as needed.   methylPREDNISolone (MEDROL DOSEPAK) 4 MG TBPK tablet Medrol dosepak - take as directed.  6 day taper   albuterol (VENTOLIN HFA) 108 (90 Base) MCG/ACT inhaler Inhale 2 puffs into the  lungs every 6 (six) hours as needed for wheezing or shortness of breath.   aspirin EC 81 MG tablet Take 1 tablet (81 mg  total) by mouth daily. Swallow whole.   atorvastatin (LIPITOR) 20 MG tablet TAKE 1 TABLET BY MOUTH 4 days a week, and 2 tablets by mouth one day per week at bedtime for cholesterol.   cholecalciferol (VITAMIN D) 1000 units tablet Take 2,000 Units by mouth daily.   Cyanocobalamin (VITAMIN B12) 1000 MCG TBCR    Esomeprazole Magnesium (NEXIUM PO) Take by mouth.   fluticasone (FLONASE) 50 MCG/ACT nasal spray Place 2 sprays into both nostrils daily.   levothyroxine (SYNTHROID) 112 MCG tablet Take 1 tablet (112 mcg total) by mouth daily.   sertraline (ZOLOFT) 25 MG tablet TAKE (1) OR (2) TABLETS BY MOUTH DAILY.   verapamil (CALAN-SR) 180 MG CR tablet Take 180 mg by mouth daily.   [DISCONTINUED] Na Sulfate-K Sulfate-Mg Sulf 17.5-3.13-1.6 GM/177ML SOLN Take by mouth.   No facility-administered encounter medications on file as of 11/07/2022.     Lab Results  Component Value Date   WBC 9.6 08/05/2022   HGB 13.4 08/05/2022   HCT 40.3 08/05/2022   PLT 265.0 08/05/2022   GLUCOSE 75 11/07/2022   CHOL 184 08/05/2022   TRIG 96.0 08/05/2022   HDL 77.80 08/05/2022   LDLCALC 87 08/05/2022   ALT 15 08/05/2022   AST 18 08/05/2022   NA 137 11/07/2022   K 5.0 11/07/2022   CL 97 11/07/2022   CREATININE 0.68 11/07/2022   BUN 10 11/07/2022   CO2 29 11/07/2022   TSH 13.91 (H) 11/07/2022    DG Chest 2 View  Result Date: 09/10/2022 CLINICAL DATA:  cough x 3 weeks EXAM: CHEST - 2 VIEW COMPARISON:  12/26/2021 FINDINGS: Cardiomediastinal silhouette is within normal limits. Increased interstitial opacities seen throughout both lungs. No focal airspace opacity. Flattening of the diaphragm consistent with emphysema. IMPRESSION: Increased interstitial opacities seen throughout both lungs which may be due to pulmonary edema or atypical infection. Electronically Signed   By: Miachel Roux M.D.    On: 09/10/2022 12:28       Assessment & Plan:  Hypercholesterolemia Assessment & Plan: Continue lipitor.  Low cholesterol diet and exercise.  Follow lipid panel and liver function tests.     Left-sided low back pain without sciatica, unspecified chronicity Assessment & Plan: Persistent.  Saw ortho. Xray as outlined.  Old compression fracture.  Muscle relaxer did not help.  Medrol dosepak - 6 day taper.  Tylenol.  Discussed the need for f/u with ortho.  Wants evaluation - [facility] - [place].   Orders: -     Basic metabolic panel -     Ambulatory referral to Orthopedic Surgery  B12 deficiency -     Vitamin B12  Osteoporosis without current pathological fracture, unspecified osteoporosis type -     VITAMIN D 25 Hydroxy (Vit-D Deficiency, Fractures)  Hypothyroidism, unspecified type Assessment & Plan: On synthroid.  Follow tsh.    Orders: -     TSH  Moderate cigarette smoker (10-19 per day) -     CT CHEST LUNG CANCER SCREENING LOW DOSE WO CONTRAST  Personal history of tobacco use, presenting hazards to health Assessment & Plan: Have discussed the need to quit smoking.  Had screening CT chest - 02/2022.  Recommended f/u 12 months. Follow.   Orders: -     CT CHEST LUNG CANCER SCREENING LOW DOSE WO CONTRAST  Screening for malignant neoplasm of respiratory organ -     CT CHEST LUNG CANCER SCREENING LOW DOSE WO CONTRAST  Anxiety Assessment & Plan: On zoloft.  Stable.  Aortic atherosclerosis (HCC) Assessment & Plan: Continue lipitor.    Coronary artery disease involving native coronary artery of native heart without angina pectoris Assessment & Plan: Recent CT chest - calcification in aorta and Left main and 3 vessel disease.  Have discussed risk factor modification. have discussed the need to quit smoking.  Family history.  Saw cardiology.  myoview unremarkable.  ECHO with preserved LV function.  Continue medical management.    Carotid artery disease, unspecified  laterality, unspecified type [facility]) Assessment & Plan: Life line screening - 10/21/21 - mild carotid artery disease.  Continue statin and adequate blood pressure control.  Follow.    Episodic cluster headache, not intractable Assessment & Plan: Episodic cluster headaches.  Continue verapamil - recently increased to '180mg'$ .  Has sumatriptan.     Centrilobular emphysema ([place]) Assessment & Plan: No increased cough or congestion.  Breathing overall stable.    Renal cyst Assessment & Plan: Was evaluated (08/2021) - Dr Erlene Quan - recommended f/u in 2 years.     Stress Assessment & Plan: On zoloft.  Does not feel needs any further intervention at this time.  Follow.    Other orders -     methylPREDNISolone; Medrol dosepak - take as directed.  6 day taper  Dispense: 21 tablet; Refill: 0     Einar Pheasant, MD

## 2022-11-08 ENCOUNTER — Encounter: Payer: Self-pay | Admitting: Internal Medicine

## 2022-11-08 NOTE — Assessment & Plan Note (Signed)
Recent CT chest - calcification in aorta and Left main and 3 vessel disease.  Have discussed risk factor modification. have discussed the need to quit smoking.  Family history.  Saw cardiology.  myoview unremarkable.  ECHO with preserved LV function.  Continue medical management.

## 2022-11-08 NOTE — Assessment & Plan Note (Signed)
No increased cough or congestion.  Breathing overall stable.

## 2022-11-08 NOTE — Assessment & Plan Note (Signed)
Continue lipitor.

## 2022-11-08 NOTE — Assessment & Plan Note (Addendum)
On synthroid.  Follow tsh.   

## 2022-11-08 NOTE — Assessment & Plan Note (Signed)
Episodic cluster headaches.  Continue verapamil - recently increased to '180mg'$ .  Has sumatriptan.

## 2022-11-08 NOTE — Assessment & Plan Note (Signed)
Have discussed the need to quit smoking.  Had screening CT chest - 02/2022.  Recommended f/u 12 months. Follow.

## 2022-11-08 NOTE — Assessment & Plan Note (Signed)
On zoloft.  Does not feel needs any further intervention at this time.  Follow.

## 2022-11-08 NOTE — Assessment & Plan Note (Signed)
On zoloft.  Stable.

## 2022-11-08 NOTE — Assessment & Plan Note (Signed)
Continue lipitor.  Low cholesterol diet and exercise.  Follow lipid panel and liver function tests.

## 2022-11-08 NOTE — Assessment & Plan Note (Signed)
Was evaluated (08/2021) - Dr Erlene Quan - recommended f/u in 2 years.

## 2022-11-08 NOTE — Assessment & Plan Note (Signed)
Life line screening - 10/21/21 - mild carotid artery disease.  Continue statin and adequate blood pressure control.  Follow.

## 2022-11-08 NOTE — Assessment & Plan Note (Signed)
Persistent.  Saw ortho. Xray as outlined.  Old compression fracture.  Muscle relaxer did not help.  Medrol dosepak - 6 day taper.  Tylenol.  Discussed the need for f/u with ortho.  Wants evaluation - [facility] - [place].

## 2022-11-10 ENCOUNTER — Other Ambulatory Visit: Payer: Self-pay

## 2022-11-10 DIAGNOSIS — E875 Hyperkalemia: Secondary | ICD-10-CM

## 2022-11-10 DIAGNOSIS — E039 Hypothyroidism, unspecified: Secondary | ICD-10-CM

## 2022-11-10 DIAGNOSIS — E7800 Pure hypercholesterolemia, unspecified: Secondary | ICD-10-CM

## 2022-11-10 MED ORDER — LEVOTHYROXINE SODIUM 125 MCG PO TABS: 125.0000 ug | ORAL_TABLET | Freq: Every day | ORAL | 1 refills | Status: DC

## 2022-11-16 ENCOUNTER — Encounter: Payer: Self-pay | Admitting: Internal Medicine

## 2022-11-16 DIAGNOSIS — K2970 Gastritis, unspecified, without bleeding: Secondary | ICD-10-CM | POA: Insufficient documentation

## 2022-12-18 DIAGNOSIS — E782 Mixed hyperlipidemia: Secondary | ICD-10-CM | POA: Diagnosis not present

## 2022-12-18 DIAGNOSIS — R001 Bradycardia, unspecified: Secondary | ICD-10-CM | POA: Diagnosis not present

## 2022-12-18 DIAGNOSIS — I2510 Atherosclerotic heart disease of native coronary artery without angina pectoris: Secondary | ICD-10-CM | POA: Diagnosis not present

## 2022-12-18 DIAGNOSIS — I700 Atherosclerosis of aorta: Secondary | ICD-10-CM | POA: Diagnosis not present

## 2022-12-18 DIAGNOSIS — J439 Emphysema, unspecified: Secondary | ICD-10-CM | POA: Diagnosis not present

## 2022-12-18 DIAGNOSIS — Z720 Tobacco use: Secondary | ICD-10-CM | POA: Diagnosis not present

## 2022-12-18 DIAGNOSIS — I779 Disorder of arteries and arterioles, unspecified: Secondary | ICD-10-CM | POA: Diagnosis not present

## 2022-12-23 DIAGNOSIS — R42 Dizziness and giddiness: Secondary | ICD-10-CM | POA: Diagnosis not present

## 2022-12-31 ENCOUNTER — Other Ambulatory Visit (INDEPENDENT_AMBULATORY_CARE_PROVIDER_SITE_OTHER): Payer: Medicare Other

## 2022-12-31 DIAGNOSIS — E7800 Pure hypercholesterolemia, unspecified: Secondary | ICD-10-CM | POA: Diagnosis not present

## 2022-12-31 DIAGNOSIS — E039 Hypothyroidism, unspecified: Secondary | ICD-10-CM | POA: Diagnosis not present

## 2022-12-31 DIAGNOSIS — E875 Hyperkalemia: Secondary | ICD-10-CM | POA: Diagnosis not present

## 2022-12-31 LAB — BASIC METABOLIC PANEL
BUN: 7 mg/dL (ref 6–23)
CO2: 29 mEq/L (ref 19–32)
Calcium: 9.6 mg/dL (ref 8.4–10.5)
Chloride: 101 mEq/L (ref 96–112)
Creatinine, Ser: 0.62 mg/dL (ref 0.40–1.20)
GFR: 89.52 mL/min (ref >60.00)
Glucose, Bld: 88 mg/dL (ref 70–99)
Potassium: 4.2 mEq/L (ref 3.5–5.1)
Sodium: 138 mEq/L (ref 135–145)

## 2022-12-31 LAB — LIPID PANEL
Cholesterol: 177 mg/dL (ref 0–200)
HDL: 77.9 mg/dL (ref >39.00)
LDL Cholesterol: 84 mg/dL (ref 0–99)
NonHDL: 99.36
Total CHOL/HDL Ratio: 2
Triglycerides: 79 mg/dL (ref 0.0–149.0)
VLDL: 15.8 mg/dL (ref 0.0–40.0)

## 2022-12-31 LAB — HEPATIC FUNCTION PANEL
ALT: 12 U/L (ref 0–35)
AST: 15 U/L (ref 0–37)
Albumin: 4.1 g/dL (ref 3.5–5.2)
Alkaline Phosphatase: 60 U/L (ref 39–117)
Bilirubin, Direct: 0.1 mg/dL (ref 0.0–0.3)
Total Bilirubin: 0.7 mg/dL (ref 0.2–1.2)
Total Protein: 6.2 g/dL (ref 6.0–8.3)

## 2022-12-31 LAB — TSH: TSH: 0.14 u[IU]/mL — ABNORMAL LOW (ref 0.35–5.50)

## 2023-01-01 ENCOUNTER — Other Ambulatory Visit: Payer: Self-pay

## 2023-01-01 DIAGNOSIS — E039 Hypothyroidism, unspecified: Secondary | ICD-10-CM

## 2023-01-06 ENCOUNTER — Encounter: Payer: Self-pay | Admitting: Internal Medicine

## 2023-01-06 ENCOUNTER — Ambulatory Visit (INDEPENDENT_AMBULATORY_CARE_PROVIDER_SITE_OTHER): Payer: Medicare Other | Admitting: Internal Medicine

## 2023-01-06 VITALS — BP 122/70 | HR 80 | Temp 98.2°F | Resp 16 | Ht 60.0 in | Wt 102.0 lb

## 2023-01-06 DIAGNOSIS — E7800 Pure hypercholesterolemia, unspecified: Secondary | ICD-10-CM

## 2023-01-06 DIAGNOSIS — F439 Reaction to severe stress, unspecified: Secondary | ICD-10-CM | POA: Diagnosis not present

## 2023-01-06 DIAGNOSIS — F419 Anxiety disorder, unspecified: Secondary | ICD-10-CM | POA: Diagnosis not present

## 2023-01-06 DIAGNOSIS — I779 Disorder of arteries and arterioles, unspecified: Secondary | ICD-10-CM | POA: Diagnosis not present

## 2023-01-06 DIAGNOSIS — I700 Atherosclerosis of aorta: Secondary | ICD-10-CM

## 2023-01-06 DIAGNOSIS — E039 Hypothyroidism, unspecified: Secondary | ICD-10-CM

## 2023-01-06 DIAGNOSIS — G44019 Episodic cluster headache, not intractable: Secondary | ICD-10-CM | POA: Diagnosis not present

## 2023-01-06 DIAGNOSIS — Z720 Tobacco use: Secondary | ICD-10-CM

## 2023-01-06 DIAGNOSIS — N281 Cyst of kidney, acquired: Secondary | ICD-10-CM | POA: Diagnosis not present

## 2023-01-06 DIAGNOSIS — I2510 Atherosclerotic heart disease of native coronary artery without angina pectoris: Secondary | ICD-10-CM | POA: Diagnosis not present

## 2023-01-06 MED ORDER — SERTRALINE HCL 25 MG PO TABS: ORAL_TABLET | ORAL | 3 refills | Status: DC

## 2023-01-06 MED ORDER — ATORVASTATIN CALCIUM 20 MG PO TABS: ORAL_TABLET | ORAL | 3 refills | Status: DC

## 2023-01-06 NOTE — Assessment & Plan Note (Addendum)
Mammogram 05/02/22 - [facility] I. Colonoscopy - 10/2022 - tubular adenoma.  Recommended f/u colonoscopy in 3 years.

## 2023-01-06 NOTE — Progress Notes (Signed)
Subjective:    Patient ID: Leah Benitez, female    DOB: 18-Sep-1951, 72 y.o.   MRN: [id number]  Patient here for  Chief Complaint  Patient presents with   Medical Management of Chronic Issues    HPI Here to follow up regarding hypercholesterolemia, CAD and hypothyroidism.  Discussed labs.  Prior to appt -notified of suppressed tsh.  Recommended to decrease synthroid dose to 114mcg q day.  On lipitor 20mg . Discussed changing to crestor. Has seen neurology for cluster headaches.  On verapamil. High flow oxygen prn. Headaches appear to be stable Previously low back pain.  Ortho.  Old compression fracture.  Saw cardiology 12/18/22 - no changes made. Tries to stay active.  No chest pain or sob reported.  No increased cough or congestion reported.  States crystals off - saw ENT.  Better.  Still some occasional mild symptoms, but overall improved.  Does not feel needs any further intervention.  Discussed bone density.    Past Medical History:  Diagnosis Date   Allergy    Hyperlipidemia    Thyroid disease    Past Surgical History:  Procedure Laterality Date   ABDOMINAL HYSTERECTOMY     previous abnormal pap smear   BREAST BIOPSY     TONSILLECTOMY     Family History  Problem Relation Age of Onset   Arthritis Mother    Stroke Mother    Hypertension Mother    Arthritis Father    Heart disease Father    Heart disease Brother    Cancer Maternal Aunt        breast   Social History   Socioeconomic History   Marital status: Married    Spouse name: Not on file   Number of children: Not on file   Years of education: Not on file   Highest education level: 12th grade  Occupational History   Not on file  Tobacco Use   Smoking status: Every Day    Packs/day: 1.00    Years: 47.00    Additional pack years: 0.00    Total pack years: 47.00    Types: Cigarettes   Smokeless tobacco: Never  Substance and Sexual Activity   Alcohol use: Yes    Alcohol/week: 7.0 standard drinks of alcohol     Types: 7 Glasses of wine per week    Comment: 1 glass of wine per night   Drug use: Not on file   Sexual activity: Not on file  Other Topics Concern   Not on file  Social History Narrative   Not on file   Social Determinants of Health   Financial Resource Strain: Low Risk  (01/05/2023)   Overall Financial Resource Strain (CARDIA)    Difficulty of Paying Living Expenses: Not very hard  Food Insecurity: No Food Insecurity (01/05/2023)   Hunger Vital Sign    Worried About Running Out of Food in the Last Year: Never true    Ran Out of Food in the Last Year: Never true  Transportation Needs: No Transportation Needs (01/05/2023)   PRAPARE - Hydrologist (Medical): No    Lack of Transportation (Non-Medical): No  Physical Activity: Unknown (01/05/2023)   Exercise Vital Sign    Days of Exercise per Week: 2 days    Minutes of Exercise per Session: Not on file  Stress: Stress Concern Present (01/05/2023)   Max    Feeling of Stress : To some extent  Social Connections: Moderately Integrated (01/05/2023)   Social Connection and Isolation Panel [NHANES]    Frequency of Communication with Friends and Family: More than three times a week    Frequency of Social Gatherings with Friends and Family: Once a week    Attends Religious Services: More than 4 times per year    Active Member of [employer] or Organizations: No    Attends Music therapist: Not on file    Marital Status: Married     Review of Systems  Constitutional:  Negative for appetite change and unexpected weight change.  HENT:  Negative for congestion and sinus pressure.   Respiratory:  Negative for cough, chest tightness and shortness of breath.   Cardiovascular:  Negative for chest pain and palpitations.  Gastrointestinal:  Negative for abdominal pain, diarrhea, nausea and vomiting.  Genitourinary:  Negative for difficulty  urinating and dysuria.  Musculoskeletal:  Negative for joint swelling and myalgias.  Skin:  Negative for color change and rash.  Neurological:  Negative for headaches.       Crystals off as outlined.   Psychiatric/Behavioral:  Negative for agitation and dysphoric mood.        Objective:     BP 122/70   Pulse 80   Temp 98.2 F (36.8 C)   Resp 16   Ht 5' (1.524 m)   Wt 102 lb (46.3 kg)   SpO2 97%   BMI 19.92 kg/m  Wt Readings from Last 3 Encounters:  01/06/23 102 lb (46.3 kg)  11/07/22 103 lb 9.6 oz (47 kg)  11/06/22 106 lb (48.1 kg)    Physical Exam Vitals reviewed.  Constitutional:      General: She is not in acute distress.    Appearance: Normal appearance.  HENT:     Head: Normocephalic and atraumatic.     Right Ear: External ear normal.     Left Ear: External ear normal.  Eyes:     General: No scleral icterus.       Right eye: No discharge.        Left eye: No discharge.     Conjunctiva/sclera: Conjunctivae normal.  Neck:     Thyroid: No thyromegaly.  Cardiovascular:     Rate and Rhythm: Normal rate and regular rhythm.  Pulmonary:     Effort: No respiratory distress.     Breath sounds: Normal breath sounds. No wheezing.  Abdominal:     General: Bowel sounds are normal.     Palpations: Abdomen is soft.     Tenderness: There is no abdominal tenderness.  Musculoskeletal:        General: No swelling or tenderness.     Cervical back: Neck supple. No tenderness.  Lymphadenopathy:     Cervical: No cervical adenopathy.  Skin:    Findings: No erythema or rash.  Neurological:     Mental Status: She is alert.  Psychiatric:        Mood and Affect: Mood normal.        Behavior: Behavior normal.      Outpatient Encounter Medications as of 01/06/2023  Medication Sig   albuterol (VENTOLIN HFA) 108 (90 Base) MCG/ACT inhaler Inhale 2 puffs into the lungs every 6 (six) hours as needed for wheezing or shortness of breath.   aspirin EC 81 MG tablet Take 1 tablet  (81 mg total) by mouth daily. Swallow whole.   cholecalciferol (VITAMIN D) 1000 units tablet Take 2,000 Units by mouth daily.   Cyanocobalamin (VITAMIN B12)  1000 MCG TBCR    Esomeprazole Magnesium (NEXIUM PO) Take by mouth.   fluticasone (FLONASE) 50 MCG/ACT nasal spray Place 2 sprays into both nostrils daily.   levothyroxine (SYNTHROID) 112 MCG tablet Take 112 mcg by mouth daily before breakfast.   verapamil (CALAN-SR) 180 MG CR tablet Take 180 mg by mouth daily.   [DISCONTINUED] atorvastatin (LIPITOR) 20 MG tablet TAKE 1 TABLET BY MOUTH 4 days a week, and 2 tablets by mouth one day per week at bedtime for cholesterol.   [DISCONTINUED] sertraline (ZOLOFT) 25 MG tablet TAKE (1) OR (2) TABLETS BY MOUTH DAILY.   atorvastatin (LIPITOR) 20 MG tablet TAKE 1 TABLET BY MOUTH 4 days a week, and 2 tablets by mouth one day per week at bedtime for cholesterol.   sertraline (ZOLOFT) 25 MG tablet Take 1-2 tablets by mouth daily   [DISCONTINUED] cyclobenzaprine (FLEXERIL) 5 MG tablet Take 5 mg by mouth at bedtime as needed.   [DISCONTINUED] levothyroxine (SYNTHROID) 125 MCG tablet Take 1 tablet (125 mcg total) by mouth daily.   [DISCONTINUED] methylPREDNISolone (MEDROL DOSEPAK) 4 MG TBPK tablet Medrol dosepak - take as directed.  6 day taper   No facility-administered encounter medications on file as of 01/06/2023.     Lab Results  Component Value Date   WBC 9.6 08/05/2022   HGB 13.4 08/05/2022   HCT 40.3 08/05/2022   PLT 265.0 08/05/2022   GLUCOSE 88 12/31/2022   CHOL 177 12/31/2022   TRIG 79.0 12/31/2022   HDL 77.90 12/31/2022   LDLCALC 84 12/31/2022   ALT 12 12/31/2022   AST 15 12/31/2022   NA 138 12/31/2022   K 4.2 12/31/2022   CL 101 12/31/2022   CREATININE 0.62 12/31/2022   BUN 7 12/31/2022   CO2 29 12/31/2022   TSH 0.14 (L) 12/31/2022    DG Chest 2 View  Result Date: 09/10/2022 CLINICAL DATA:  cough x 3 weeks EXAM: CHEST - 2 VIEW COMPARISON:  12/26/2021 FINDINGS: Cardiomediastinal  silhouette is within normal limits. Increased interstitial opacities seen throughout both lungs. No focal airspace opacity. Flattening of the diaphragm consistent with emphysema. IMPRESSION: Increased interstitial opacities seen throughout both lungs which may be due to pulmonary edema or atypical infection. Electronically Signed   By: Miachel Roux M.D.   On: 09/10/2022 12:28       Assessment & Plan:  Anxiety Assessment & Plan: On zoloft. Does not feel needs any further intervention.  Follow.     Aortic atherosclerosis (HCC) Assessment & Plan: Continue lipitor.    Coronary artery disease involving native coronary artery of native heart without angina pectoris Assessment & Plan: Recent CT chest - calcification in aorta and Left main and 3 vessel disease.  Have discussed risk factor modification. have discussed the need to quit smoking.  Family history.  Saw cardiology.  myoview unremarkable.  ECHO with preserved LV function.  Continue medical management. Recently evaluated.  Stable.  No changes.    Carotid artery disease, unspecified laterality, unspecified type [facility]) Assessment & Plan: Life line screening - 10/21/21 - mild carotid artery disease.  Continue statin and adequate blood pressure control.  Follow.    Episodic cluster headache, not intractable Assessment & Plan: Episodic cluster headaches.  Continue verapamil - 180mg .  Has sumatriptan.  Stable.    Hypercholesterolemia Assessment & Plan: Continue lipitor.  Low cholesterol diet and exercise.  Follow lipid panel and liver function tests.     Hypothyroidism, unspecified type Assessment & Plan: On synthroid.  Follow tsh.  Renal cyst Assessment & Plan: Was evaluated (08/2021) - Dr Erlene Quan - recommended f/u in 2 years.     Stress Assessment & Plan: On zoloft.  Does not feel needs any further intervention at this time.  Follow.    Tobacco abuse  Other orders -     Atorvastatin Calcium; TAKE 1 TABLET BY MOUTH 4  days a week, and 2 tablets by mouth one day per week at bedtime for cholesterol.  Dispense: 103 tablet; Refill: 3 -     Sertraline HCl; Take 1-2 tablets by mouth daily  Dispense: 120 tablet; Refill: 3     Einar Pheasant, MD

## 2023-01-11 ENCOUNTER — Encounter: Payer: Self-pay | Admitting: Internal Medicine

## 2023-01-11 NOTE — Assessment & Plan Note (Signed)
On zoloft. Does not feel needs any further intervention.  Follow.

## 2023-01-11 NOTE — Assessment & Plan Note (Signed)
On synthroid.  Follow tsh.   

## 2023-01-11 NOTE — Assessment & Plan Note (Signed)
Episodic cluster headaches.  Continue verapamil - 180mg .  Has sumatriptan.  Stable.

## 2023-01-11 NOTE — Assessment & Plan Note (Signed)
Was evaluated (08/2021) - Dr Brandon - recommended f/u in 2 years.   

## 2023-01-11 NOTE — Assessment & Plan Note (Signed)
Life line screening - 10/21/21 - mild carotid artery disease.  Continue statin and adequate blood pressure control.  Follow.  

## 2023-01-11 NOTE — Assessment & Plan Note (Signed)
Continue lipitor  ?

## 2023-01-11 NOTE — Assessment & Plan Note (Signed)
On zoloft.  Does not feel needs any further intervention at this time.  Follow.  

## 2023-01-11 NOTE — Assessment & Plan Note (Signed)
Continue lipitor.  Low cholesterol diet and exercise.  Follow lipid panel and liver function tests.   

## 2023-01-11 NOTE — Assessment & Plan Note (Signed)
Recent CT chest - calcification in aorta and Left main and 3 vessel disease.  Have discussed risk factor modification. have discussed the need to quit smoking.  Family history.  Saw cardiology.  myoview unremarkable.  ECHO with preserved LV function.  Continue medical management. Recently evaluated.  Stable.  No changes.

## 2023-01-17 IMAGING — CT CT CHEST LUNG CANCER SCREENING LOW DOSE W/O CM
2 of 5 series · 15 of 40 positions shown, 18 images · non-contrast
Comparison: 01/28/2021

CLINICAL DATA: Forty-nine pack-year smoking history/current smoker.
Right-sided chest trauma earlier this [place].



[Series 3: lung 1.00 · axial · 0.56mm/px · z∈[-1186,-898]mm · 12 of 318 slices shown, 15 images]
[im 15/318  mediastinal]
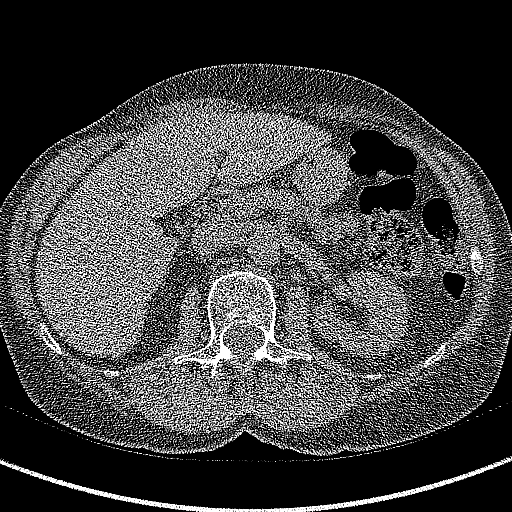
[im 15/318  lung]
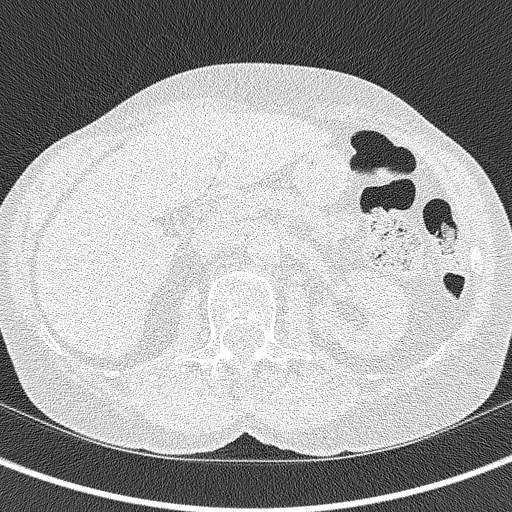
[im 44/318  lung]
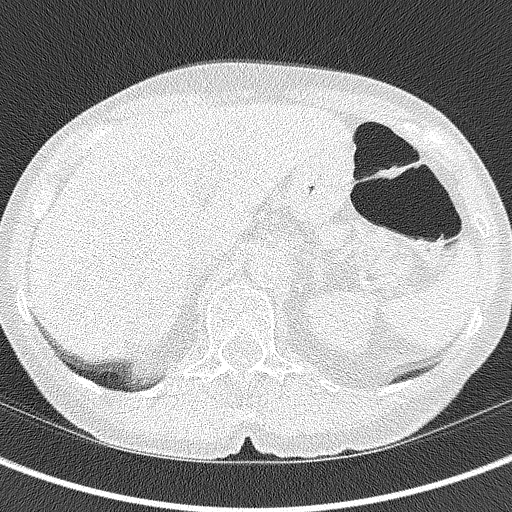
[im 73/318  lung]
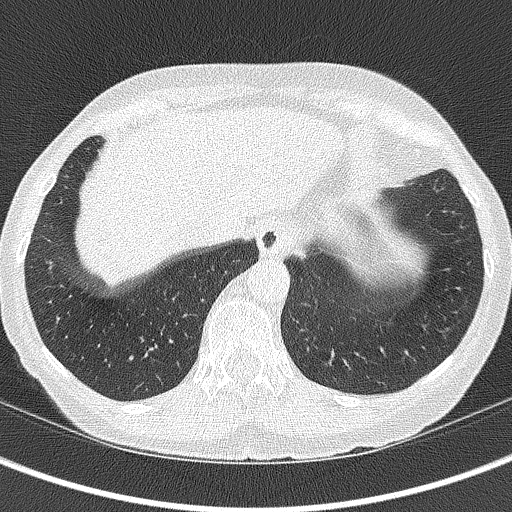
[im 101/318  lung]
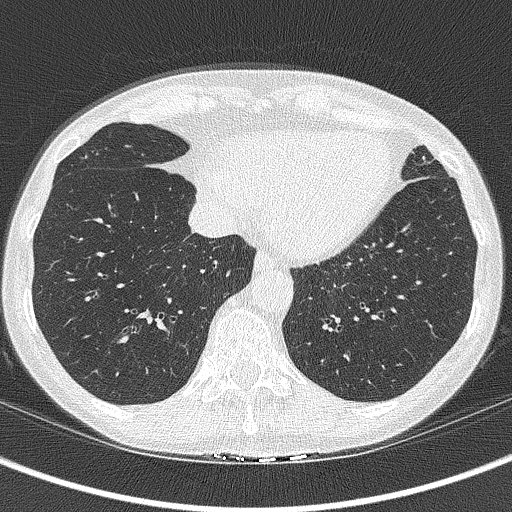
[im 116/318  mediastinal]
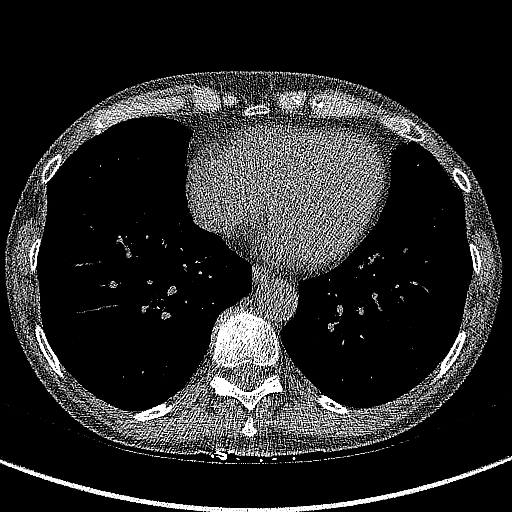
[im 116/318  lung]
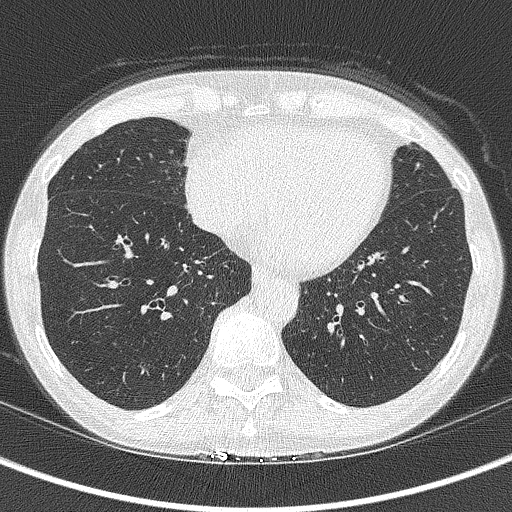
[im 145/318  lung]
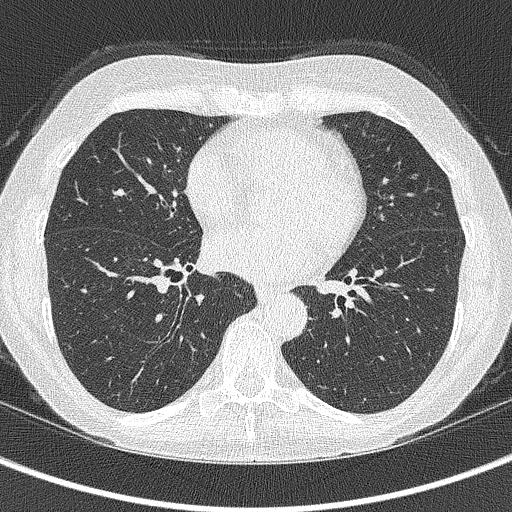
[im 173/318  lung]
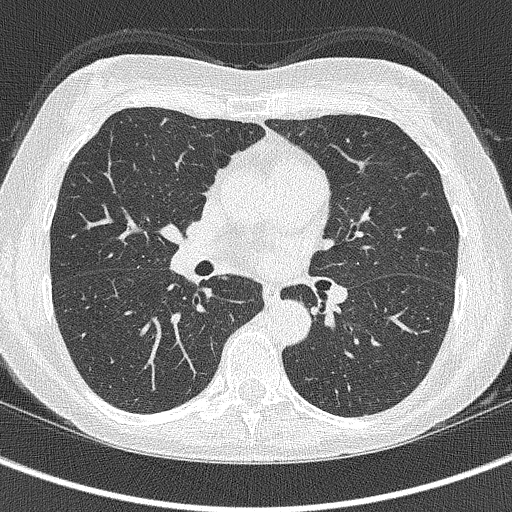
[im 202/318  lung]
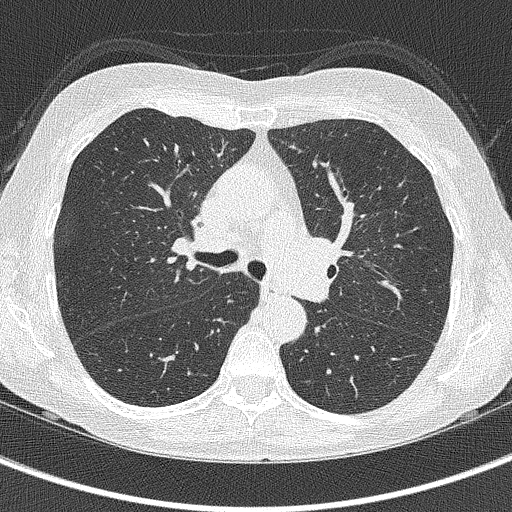
[im 217/318  mediastinal]
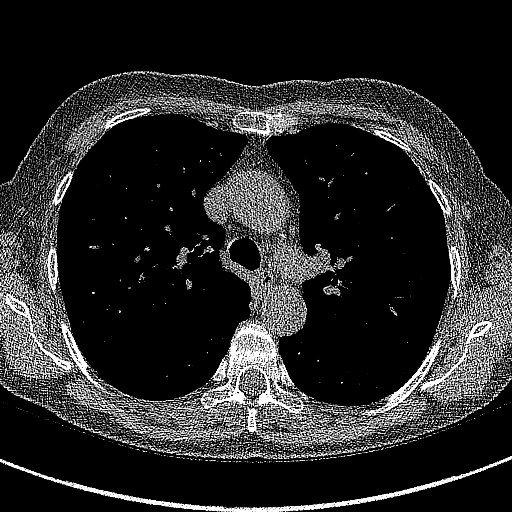
[im 217/318  lung]
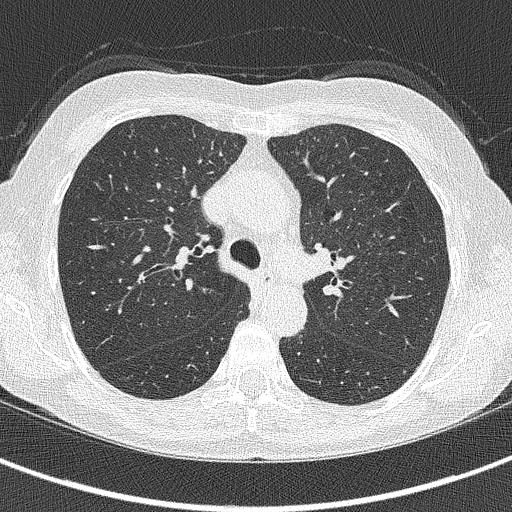
[im 245/318  lung]
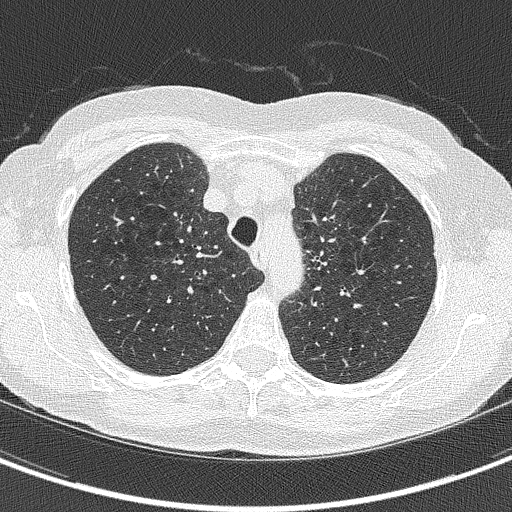
[im 274/318  lung]
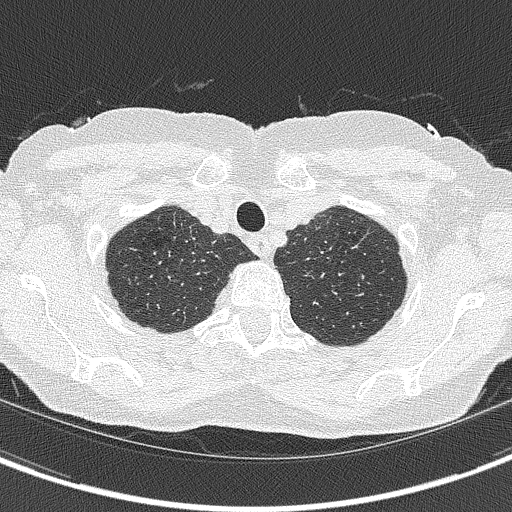
[im 303/318  lung]
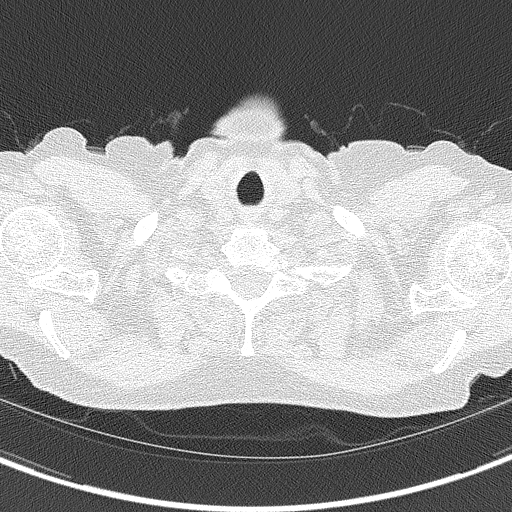

[Series 5: coronals lung 1.00 cor · coronal · 0.56mm/px · 3 of 235 slices shown]
[im 47/235  lung]
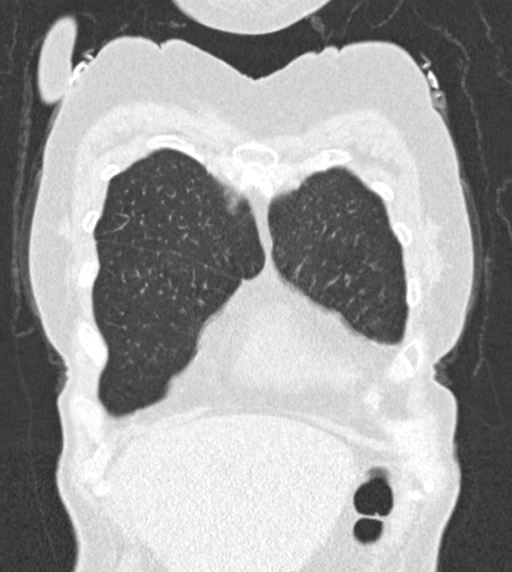
[im 94/235  lung]
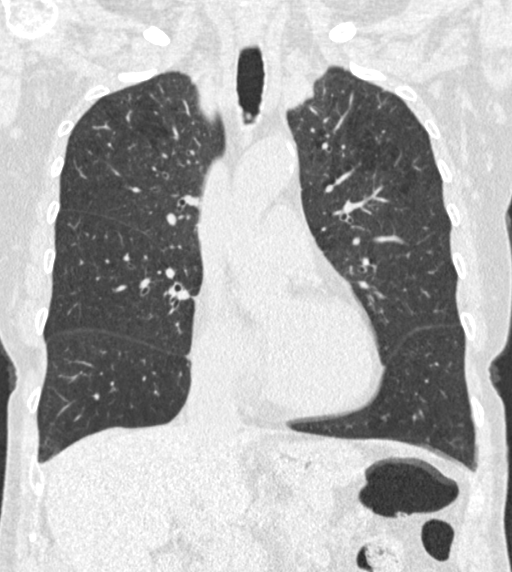
[im 141/235  lung]
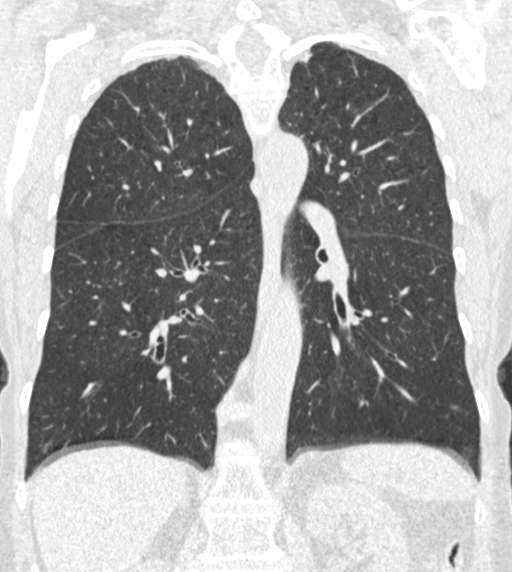

[15 of 40 positions shown; findings below may reference images not displayed]

FINDINGS: Cardiovascular: Aortic atherosclerosis. Normal heart size, without
pericardial effusion. Three vessel coronary artery calcification.

Mediastinum/Nodes: No mediastinal or definite hilar adenopathy,
given limitations of unenhanced CT. Distal esophageal fluid level on
48/2.

Lungs/Pleura: No pleural fluid. Moderate centrilobular emphysema.
Biapical pleuroparenchymal scarring.

Minimal motion degradation inferiorly.

Primarily Perifissural pulmonary nodules of maximally volume derived
equivalent diameter 4.3 mm are not significantly changed.

Upper Abdomen: Normal imaged portions of the liver, spleen,
pancreas, adrenal glands, left kidney.

Musculoskeletal: Anterior right fifth and sixth subacute but
interval rib fractures including on 32 and 39 of series 2.
Osteopenia.
IMPRESSION: 1. Lung-RADS 2, benign appearance or behavior. Continue annual
screening with low-dose chest CT without contrast in 12 months.
2. Anterior right fifth and sixth rib fractures, new since the prior
CT but subacute.
3. Esophageal air fluid level suggests dysmotility or
gastroesophageal reflux.
4. Aortic Atherosclerosis (BQQAF-PQ5.5) and Emphysema ([id number].X).
Coronary artery atherosclerosis.

## 2023-01-19 ENCOUNTER — Other Ambulatory Visit: Payer: Self-pay | Admitting: Internal Medicine

## 2023-01-20 ENCOUNTER — Other Ambulatory Visit: Payer: Self-pay | Admitting: Internal Medicine

## 2023-02-27 ENCOUNTER — Ambulatory Visit
Admission: RE | Admit: 2023-02-27 | Discharge: 2023-02-27 | Disposition: A | Discharge status: Home / Self Care | Payer: Medicare Other | Source: Ambulatory Visit | Attending: Internal Medicine | Admitting: Internal Medicine

## 2023-02-27 ENCOUNTER — Other Ambulatory Visit (INDEPENDENT_AMBULATORY_CARE_PROVIDER_SITE_OTHER): Payer: Medicare Other

## 2023-02-27 DIAGNOSIS — E039 Hypothyroidism, unspecified: Secondary | ICD-10-CM | POA: Diagnosis not present

## 2023-02-27 DIAGNOSIS — F17210 Nicotine dependence, cigarettes, uncomplicated: Secondary | ICD-10-CM | POA: Insufficient documentation

## 2023-02-27 DIAGNOSIS — Z122 Encounter for screening for malignant neoplasm of respiratory organs: Secondary | ICD-10-CM | POA: Insufficient documentation

## 2023-02-27 DIAGNOSIS — Z87891 Personal history of nicotine dependence: Secondary | ICD-10-CM | POA: Diagnosis not present

## 2023-02-27 LAB — TSH: TSH: 0.68 u[IU]/mL (ref 0.35–5.50)

## 2023-03-02 ENCOUNTER — Other Ambulatory Visit: Payer: Medicare Other

## 2023-03-04 ENCOUNTER — Other Ambulatory Visit: Payer: Self-pay | Admitting: Acute Care

## 2023-03-04 DIAGNOSIS — F17210 Nicotine dependence, cigarettes, uncomplicated: Secondary | ICD-10-CM

## 2023-03-04 DIAGNOSIS — Z87891 Personal history of nicotine dependence: Secondary | ICD-10-CM

## 2023-03-04 DIAGNOSIS — Z122 Encounter for screening for malignant neoplasm of respiratory organs: Secondary | ICD-10-CM

## 2023-03-11 ENCOUNTER — Telehealth: Payer: Self-pay

## 2023-03-11 NOTE — Telephone Encounter (Signed)
LM for patient to schedule appt with Dr Lorin Picket to discuss her CT scan she had done recently.

## 2023-03-12 NOTE — Telephone Encounter (Signed)
Pt returned [place] LPN call. Note below was read to her. Pt stated that she already have an appt with provider on 05/12/23. I booked her another appt for 6/25 the sooner to see provider.

## 2023-03-12 NOTE — Telephone Encounter (Signed)
See me before calling. Can schedule earlier appt to discuss further w/up.  Confirm doing ok.  Scan reveals changes that appear to be c/w compression fracture.

## 2023-03-12 NOTE — Telephone Encounter (Signed)
Do you want to see her sooner than 6/25?

## 2023-03-13 NOTE — Telephone Encounter (Signed)
LMTCB

## 2023-03-13 NOTE — Telephone Encounter (Signed)
Patient returned office phone call. 

## 2023-03-17 NOTE — Telephone Encounter (Signed)
Appt has been moved up to next week/

## 2023-03-25 ENCOUNTER — Ambulatory Visit (INDEPENDENT_AMBULATORY_CARE_PROVIDER_SITE_OTHER): Payer: Medicare Other

## 2023-03-25 ENCOUNTER — Encounter: Payer: Self-pay | Admitting: Internal Medicine

## 2023-03-25 ENCOUNTER — Ambulatory Visit (INDEPENDENT_AMBULATORY_CARE_PROVIDER_SITE_OTHER): Payer: Medicare Other | Admitting: Internal Medicine

## 2023-03-25 VITALS — BP 130/72 | HR 72 | Temp 97.9°F | Resp 16 | Ht 60.0 in | Wt 103.0 lb

## 2023-03-25 DIAGNOSIS — M4312 Spondylolisthesis, cervical region: Secondary | ICD-10-CM | POA: Diagnosis not present

## 2023-03-25 DIAGNOSIS — M542 Cervicalgia: Secondary | ICD-10-CM

## 2023-03-25 DIAGNOSIS — E7800 Pure hypercholesterolemia, unspecified: Secondary | ICD-10-CM | POA: Diagnosis not present

## 2023-03-25 DIAGNOSIS — F439 Reaction to severe stress, unspecified: Secondary | ICD-10-CM | POA: Diagnosis not present

## 2023-03-25 DIAGNOSIS — I2510 Atherosclerotic heart disease of native coronary artery without angina pectoris: Secondary | ICD-10-CM | POA: Diagnosis not present

## 2023-03-25 DIAGNOSIS — S32010D Wedge compression fracture of first lumbar vertebra, subsequent encounter for fracture with routine healing: Secondary | ICD-10-CM | POA: Diagnosis not present

## 2023-03-25 DIAGNOSIS — M8580 Other specified disorders of bone density and structure, unspecified site: Secondary | ICD-10-CM

## 2023-03-25 DIAGNOSIS — M545 Low back pain, unspecified: Secondary | ICD-10-CM

## 2023-03-25 DIAGNOSIS — F419 Anxiety disorder, unspecified: Secondary | ICD-10-CM | POA: Diagnosis not present

## 2023-03-25 DIAGNOSIS — J432 Centrilobular emphysema: Secondary | ICD-10-CM

## 2023-03-25 DIAGNOSIS — S32010A Wedge compression fracture of first lumbar vertebra, initial encounter for closed fracture: Secondary | ICD-10-CM | POA: Diagnosis not present

## 2023-03-25 DIAGNOSIS — E039 Hypothyroidism, unspecified: Secondary | ICD-10-CM | POA: Diagnosis not present

## 2023-03-25 DIAGNOSIS — I779 Disorder of arteries and arterioles, unspecified: Secondary | ICD-10-CM | POA: Diagnosis not present

## 2023-03-25 DIAGNOSIS — I700 Atherosclerosis of aorta: Secondary | ICD-10-CM

## 2023-03-25 DIAGNOSIS — N281 Cyst of kidney, acquired: Secondary | ICD-10-CM

## 2023-03-25 NOTE — Progress Notes (Signed)
Subjective:    Patient ID: Leah Benitez, female    DOB: [birth year], 72 y.o.   MRN: [id number]  Patient here for  Chief Complaint  Patient presents with   Results    Discuss CT scan results.    HPI Here as a work in appt to discuss recent CT scan.  CT chest 02/27/23 - Lung-RADS 2, benign appearance or behavior. Continue annual screening with low-dose chest CT without contrast in 12 months. Superior endplate compression deformity at L1 appears to be new in the interval since the prior study.  She is having some low back pain.  Appears to be more localized in her left buttock.  Also reports increased neck pain and limited rom.  Hurts more to look to the right.  Pain more localized - left posterior lateral neck.  Taking ibuprofen daily.  Stool softener to keep bowels moving.  Last bone density 05/2022.  Have discussed treatment for osteoporosis.  Interested in reclast.  Discussed referral to endocrinology.     Past Medical History:  Diagnosis Date   Allergy    Hyperlipidemia    Thyroid disease    Past Surgical History:  Procedure Laterality Date   ABDOMINAL HYSTERECTOMY     previous abnormal pap smear   BREAST BIOPSY     TONSILLECTOMY     Family History  Problem Relation Age of Onset   Arthritis Mother    Stroke Mother    Hypertension Mother    Arthritis Father    Heart disease Father    Heart disease Brother    Cancer Maternal Aunt        breast   Social History   Socioeconomic History   Marital status: Not on file    Spouse name: Not on file   Number of children: Not on file   Years of education: Not on file   Highest education level: 12th grade  Occupational History   Not on file  Tobacco Use   Smoking status: Every Day    Packs/day: 1.00    Years: 47.00    Additional pack years: 0.00    Total pack years: 47.00    Types: Cigarettes   Smokeless tobacco: Never  Substance and Sexual Activity   Alcohol use: Yes    Alcohol/week: 7.0 standard drinks of alcohol     Types: 7 Glasses of wine per week    Comment: 1 glass of wine per night   Drug use: Not on file   Sexual activity: Not on file  Other Topics Concern   Not on file  Social History Narrative   Not on file   Social Determinants of Health   Financial Resource Strain: Low Risk  (01/05/2023)   Overall Financial Resource Strain (CARDIA)    Difficulty of Paying Living Expenses: Not very hard  Food Insecurity: No Food Insecurity (01/05/2023)   Hunger Vital Sign    Worried About Running Out of Food in the Last Year: Never true    Ran Out of Food in the Last Year: Never true  Transportation Needs: No Transportation Needs (01/05/2023)   PRAPARE - Administrator, Civil Service (Medical): No    Lack of Transportation (Non-Medical): No  Physical Activity: Unknown (01/05/2023)   Exercise Vital Sign    Days of Exercise per Week: 2 days    Minutes of Exercise per Session: Not on file  Stress: Stress Concern Present (01/05/2023)   [employer] of Occupational Health - Occupational Stress Questionnaire  Feeling of Stress : To some extent  Social Connections: Moderately Integrated (01/05/2023)   Social Connection and Isolation Panel [NHANES]    Frequency of Communication with Friends and Family: More than three times a week    Frequency of Social Gatherings with Friends and Family: Once a week    Attends Religious Services: More than 4 times per year    Active Member of [employer] or Organizations: No    Attends Engineer, structural: Not on file    Marital Status: Married     Review of Systems  Constitutional:  Negative for appetite change and unexpected weight change.  HENT:  Negative for congestion and sinus pressure.   Respiratory:  Negative for cough, chest tightness and shortness of breath.   Cardiovascular:  Negative for chest pain, palpitations and leg swelling.  Gastrointestinal:  Negative for abdominal pain, diarrhea, nausea and vomiting.  Genitourinary:  Negative  for difficulty urinating and dysuria.  Musculoskeletal:  Positive for back pain and neck pain. Negative for myalgias.  Skin:  Negative for color change and rash.  Neurological:  Negative for dizziness and headaches.  Psychiatric/Behavioral:  Negative for agitation and dysphoric mood.        Objective:     BP 130/72   Pulse 72   Temp 97.9 F (36.6 C)   Resp 16   Ht 5' (1.524 m)   Wt 103 lb (46.7 kg)   SpO2 96%   BMI 20.12 kg/m  Wt Readings from Last 3 Encounters:  03/25/23 103 lb (46.7 kg)  01/06/23 102 lb (46.3 kg)  11/07/22 103 lb 9.6 oz (47 kg)    Physical Exam Vitals reviewed.  Constitutional:      General: She is not in acute distress.    Appearance: Normal appearance.  HENT:     Head: Normocephalic and atraumatic.     Right Ear: External ear normal.     Left Ear: External ear normal.  Eyes:     General: No scleral icterus.       Right eye: No discharge.        Left eye: No discharge.     Conjunctiva/sclera: Conjunctivae normal.  Neck:     Thyroid: No thyromegaly.  Cardiovascular:     Rate and Rhythm: Normal rate and regular rhythm.  Pulmonary:     Effort: No respiratory distress.     Breath sounds: Normal breath sounds. No wheezing.  Abdominal:     General: Bowel sounds are normal.     Palpations: Abdomen is soft.     Tenderness: There is no abdominal tenderness.  Musculoskeletal:        General: No swelling or tenderness.     Cervical back: Neck supple. No tenderness.     Comments: Limited rom - neck.   Lymphadenopathy:     Cervical: No cervical adenopathy.  Skin:    Findings: No erythema or rash.  Neurological:     Mental Status: She is alert.  Psychiatric:        Mood and Affect: Mood normal.        Behavior: Behavior normal.      Outpatient Encounter Medications as of 03/25/2023  Medication Sig   albuterol (VENTOLIN HFA) 108 (90 Base) MCG/ACT inhaler Inhale 2 puffs into the lungs every 6 (six) hours as needed for wheezing or shortness of  breath.   aspirin EC 81 MG tablet Take 1 tablet (81 mg total) by mouth daily. Swallow whole.   atorvastatin (LIPITOR)  20 MG tablet TAKE 1 TABLET BY MOUTH AT BEDTIME FOR CHOLESTEROL   cholecalciferol (VITAMIN D) 1000 units tablet Take 2,000 Units by mouth daily.   Cyanocobalamin (VITAMIN B12) 1000 MCG TBCR    Esomeprazole Magnesium (NEXIUM PO) Take by mouth.   fluticasone (FLONASE) 50 MCG/ACT nasal spray Place 2 sprays into both nostrils daily.   levothyroxine (SYNTHROID) 112 MCG tablet TAKE ONE TABLET BY MOUTH ONCE DAILY   sertraline (ZOLOFT) 25 MG tablet Take 1-2 tablets by mouth daily   verapamil (CALAN-SR) 180 MG CR tablet Take 180 mg by mouth daily.   No facility-administered encounter medications on file as of 03/25/2023.     Lab Results  Component Value Date   WBC 9.6 08/05/2022   HGB 13.4 08/05/2022   HCT 40.3 08/05/2022   PLT 265.0 08/05/2022   GLUCOSE 88 12/31/2022   CHOL 177 12/31/2022   TRIG 79.0 12/31/2022   HDL 77.90 12/31/2022   LDLCALC 84 12/31/2022   ALT 12 12/31/2022   AST 15 12/31/2022   NA 138 12/31/2022   K 4.2 12/31/2022   CL 101 12/31/2022   CREATININE 0.62 12/31/2022   BUN 7 12/31/2022   CO2 29 12/31/2022   TSH 0.68 02/27/2023    CT CHEST LUNG CA SCREEN LOW DOSE W/O CM  Result Date: 03/04/2023 CLINICAL DATA:  72 year old female with 49 pack year history of smoking. Lung cancer screening. EXAM: CT CHEST WITHOUT CONTRAST LOW-DOSE FOR LUNG CANCER SCREENING TECHNIQUE: Multidetector CT imaging of the chest was performed following the standard protocol without IV contrast. RADIATION DOSE REDUCTION: This exam was performed according to the departmental dose-optimization program which includes automated exposure control, adjustment of the mA and/or kV according to patient size and/or use of iterative reconstruction technique. COMPARISON:  02/25/2022 FINDINGS: Cardiovascular: The heart size is normal. No substantial pericardial effusion. Coronary artery  calcification is evident. Mild atherosclerotic calcification is noted in the wall of the thoracic aorta. Mediastinum/Nodes: No mediastinal lymphadenopathy. No evidence for gross hilar lymphadenopathy although assessment is limited by the lack of intravenous contrast on the current study. The esophagus has normal imaging features. There is no axillary lymphadenopathy. Lungs/Pleura: Centrilobular and paraseptal emphysema evident. Biapical pleuroparenchymal scarring again noted. Scattered tiny pulmonary nodules identified previously are stable in the interval. There is no new suspicious pulmonary nodule or mass. No focal airspace consolidation. No pleural effusion. Upper Abdomen: Unremarkable. Musculoskeletal: No worrisome lytic or sclerotic osseous abnormality. Superior endplate compression deformity at L1 appears to be new in the interval since the prior study. IMPRESSION: 1. Lung-RADS 2, benign appearance or behavior. Continue annual screening with low-dose chest CT without contrast in 12 months. 2. Superior endplate compression deformity at L1 appears to be new in the interval since the prior study. 3.  Aortic Atherosclerosis (ICD10-I70.0). Electronically Signed   By: Kennith Center M.D.   On: 03/04/2023 08:21      Assessment & Plan:  Neck pain Assessment & Plan: Persistent increased neck pain and limited rom.  Check xray.  Trial of zanaflex.  Follow.    Orders: -     DG Cervical Spine 2 or 3 views; Future  Compression fracture of L1 vertebra with routine healing, subsequent encounter Assessment & Plan: Seen on CT.  Increased pain.  Check L-S spine xray.  Discussed osteoporosis treatment as outlined above.  Referral to endocrinology.   Orders: -     Ambulatory referral to Endocrinology  Midline low back pain without sciatica, unspecified chronicity Assessment & Plan: Increased  pain.  Compression fracture noted on CT.  Check L-S spine xray.  Bone density 06/2022.  Discussed osteoporosis treatment.  Given persistent pain, consider ortho referral.   Orders: -     DG Lumbar Spine 2-3 Views; Future  Osteopenia, unspecified location Assessment & Plan: Reviewed bone density.  Discussed treatment options.  Had intolerance to actonel.  Discussed reclast and prolia. Discussed weight bearing exercise.  Refer to endocrinology - interested in reclast.    Anxiety Assessment & Plan: On zoloft. Does not feel needs any further intervention.  Follow.     Aortic atherosclerosis (HCC) Assessment & Plan: Continue lipitor.    Coronary artery disease involving native coronary artery of native heart without angina pectoris Assessment & Plan: Recent CT chest - calcification in aorta and Left main and 3 vessel disease.  Have discussed risk factor modification. have discussed the need to quit smoking.  Family history.  Saw cardiology.  myoview unremarkable.  ECHO with preserved LV function.  Continue medical management. Recently evaluated.  Stable.  No changes.    Carotid artery disease, unspecified laterality, unspecified type [facility]) Assessment & Plan: Life line screening - 10/21/21 - mild carotid artery disease.  Continue statin and adequate blood pressure control.  Follow.    Centrilobular emphysema (HCC) Assessment & Plan: No increased cough or congestion.  Breathing overall stable.    Hypercholesterolemia Assessment & Plan: Continue lipitor.  Low cholesterol diet and exercise.  Follow lipid panel and liver function tests.     Hypothyroidism, unspecified type Assessment & Plan: On synthroid.  Follow tsh.     Renal cyst Assessment & Plan: Was evaluated (08/2021) - Dr Apolinar Junes - recommended f/u in 2 years.     Stress Assessment & Plan: On zoloft.  Does not feel needs any further intervention at this time.  Follow.       Dale [place], MD

## 2023-03-27 ENCOUNTER — Encounter: Payer: Self-pay | Admitting: Internal Medicine

## 2023-03-27 MED ORDER — TIZANIDINE HCL 2 MG PO CAPS: 2.0000 mg | ORAL_CAPSULE | Freq: Every evening | ORAL | 0 refills | Status: DC | PRN

## 2023-03-27 NOTE — Telephone Encounter (Signed)
I have sent in the prescription for zanaflex.  Take one before bed time as needed.  Sorry for the delay.

## 2023-04-01 ENCOUNTER — Encounter: Payer: Self-pay | Admitting: Internal Medicine

## 2023-04-01 NOTE — Assessment & Plan Note (Signed)
Persistent increased neck pain and limited rom.  Check xray.  Trial of zanaflex.  Follow.

## 2023-04-01 NOTE — Assessment & Plan Note (Signed)
Life line screening - 10/21/21 - mild carotid artery disease.  Continue statin and adequate blood pressure control.  Follow.  

## 2023-04-01 NOTE — Assessment & Plan Note (Signed)
Continue lipitor  ?

## 2023-04-01 NOTE — Assessment & Plan Note (Signed)
Was evaluated (08/2021) - Dr Brandon - recommended f/u in 2 years.   

## 2023-04-01 NOTE — Assessment & Plan Note (Signed)
Increased pain.  Compression fracture noted on CT.  Check L-S spine xray.  Bone density 06/2022.  Discussed osteoporosis treatment. Given persistent pain, consider ortho referral.

## 2023-04-01 NOTE — Assessment & Plan Note (Signed)
Seen on CT.  Increased pain.  Check L-S spine xray.  Discussed osteoporosis treatment as outlined above.  Referral to endocrinology.

## 2023-04-01 NOTE — Assessment & Plan Note (Signed)
On synthroid.  Follow tsh.   

## 2023-04-01 NOTE — Assessment & Plan Note (Signed)
Continue lipitor.  Low cholesterol diet and exercise.  Follow lipid panel and liver function tests.   

## 2023-04-01 NOTE — Assessment & Plan Note (Signed)
On zoloft.  Does not feel needs any further intervention at this time.  Follow.  

## 2023-04-01 NOTE — Assessment & Plan Note (Signed)
No increased cough or congestion.  Breathing overall stable.  

## 2023-04-01 NOTE — Assessment & Plan Note (Signed)
On zoloft. Does not feel needs any further intervention.  Follow.   

## 2023-04-01 NOTE — Assessment & Plan Note (Signed)
Recent CT chest - calcification in aorta and Left main and 3 vessel disease.  Have discussed risk factor modification. have discussed the need to quit smoking.  Family history.  Saw cardiology.  myoview unremarkable.  ECHO with preserved LV function.  Continue medical management. Recently evaluated.  Stable.  No changes.  

## 2023-04-01 NOTE — Assessment & Plan Note (Signed)
Reviewed bone density.  Discussed treatment options.  Had intolerance to actonel.  Discussed reclast and prolia. Discussed weight bearing exercise.  Refer to endocrinology - interested in reclast.

## 2023-04-02 ENCOUNTER — Telehealth: Payer: Self-pay

## 2023-04-02 NOTE — Telephone Encounter (Signed)
-----   Message from Dale White, MD sent at 04/02/2023  5:21 AM EDT ----- Notify - neck xray reveals arthritis changes.  Back xray reveals arthritis changes and the compression fracture.  Given increased pain and xray findings, I would like to refer her to Dr Mickeal Needy - for further evaluation and treatment.

## 2023-04-03 NOTE — Telephone Encounter (Signed)
Patient called and note was read. Patient would like to know who Dr Delrae Sawyers / Alphonzo Lemmings are and where they are located.

## 2023-04-03 NOTE — Progress Notes (Unsigned)
Order placed for referral to Dr Chasnis.   

## 2023-04-06 DIAGNOSIS — M810 Age-related osteoporosis without current pathological fracture: Secondary | ICD-10-CM | POA: Diagnosis not present

## 2023-04-06 DIAGNOSIS — F17200 Nicotine dependence, unspecified, uncomplicated: Secondary | ICD-10-CM | POA: Diagnosis not present

## 2023-04-06 NOTE — Telephone Encounter (Signed)
The referral is already in.  They are located at [facility]

## 2023-04-06 NOTE — Telephone Encounter (Signed)
Patient is agreeable to referral. I tried to place referral for you but was not able to find in epic.

## 2023-04-06 NOTE — Telephone Encounter (Signed)
LMTCB

## 2023-04-07 ENCOUNTER — Ambulatory Visit: Payer: Medicare Other | Admitting: Internal Medicine

## 2023-04-07 NOTE — Telephone Encounter (Signed)
Noted. Patient is aware. 

## 2023-04-10 DIAGNOSIS — M5136 Other intervertebral disc degeneration, lumbar region: Secondary | ICD-10-CM | POA: Diagnosis not present

## 2023-04-10 DIAGNOSIS — M5030 Other cervical disc degeneration, unspecified cervical region: Secondary | ICD-10-CM | POA: Diagnosis not present

## 2023-04-10 DIAGNOSIS — M47812 Spondylosis without myelopathy or radiculopathy, cervical region: Secondary | ICD-10-CM | POA: Diagnosis not present

## 2023-04-10 DIAGNOSIS — M47816 Spondylosis without myelopathy or radiculopathy, lumbar region: Secondary | ICD-10-CM | POA: Diagnosis not present

## 2023-04-22 DIAGNOSIS — M545 Low back pain, unspecified: Secondary | ICD-10-CM | POA: Diagnosis not present

## 2023-04-22 DIAGNOSIS — M542 Cervicalgia: Secondary | ICD-10-CM | POA: Diagnosis not present

## 2023-04-28 ENCOUNTER — Telehealth: Payer: Self-pay | Admitting: Internal Medicine

## 2023-04-28 DIAGNOSIS — E039 Hypothyroidism, unspecified: Secondary | ICD-10-CM

## 2023-04-28 DIAGNOSIS — E7800 Pure hypercholesterolemia, unspecified: Secondary | ICD-10-CM

## 2023-04-28 NOTE — Telephone Encounter (Signed)
Patient need lab orders.

## 2023-04-29 DIAGNOSIS — M810 Age-related osteoporosis without current pathological fracture: Secondary | ICD-10-CM | POA: Diagnosis not present

## 2023-04-29 NOTE — Telephone Encounter (Signed)
Orders placed for future labs.  

## 2023-04-30 DIAGNOSIS — M545 Low back pain, unspecified: Secondary | ICD-10-CM | POA: Diagnosis not present

## 2023-04-30 DIAGNOSIS — M542 Cervicalgia: Secondary | ICD-10-CM | POA: Diagnosis not present

## 2023-05-06 DIAGNOSIS — M542 Cervicalgia: Secondary | ICD-10-CM | POA: Diagnosis not present

## 2023-05-06 DIAGNOSIS — M545 Low back pain, unspecified: Secondary | ICD-10-CM | POA: Diagnosis not present

## 2023-05-07 ENCOUNTER — Other Ambulatory Visit (INDEPENDENT_AMBULATORY_CARE_PROVIDER_SITE_OTHER): Payer: Medicare Other

## 2023-05-07 DIAGNOSIS — E7800 Pure hypercholesterolemia, unspecified: Secondary | ICD-10-CM

## 2023-05-07 DIAGNOSIS — E039 Hypothyroidism, unspecified: Secondary | ICD-10-CM | POA: Diagnosis not present

## 2023-05-07 LAB — HEPATIC FUNCTION PANEL
ALT: 18 U/L (ref 0–35)
AST: 20 U/L (ref 0–37)
Albumin: 4.4 g/dL (ref 3.5–5.2)
Alkaline Phosphatase: 59 U/L (ref 39–117)
Bilirubin, Direct: 0.1 mg/dL (ref 0.0–0.3)
Total Bilirubin: 0.6 mg/dL (ref 0.2–1.2)
Total Protein: 6.7 g/dL (ref 6.0–8.3)

## 2023-05-07 LAB — CBC WITH DIFFERENTIAL/PLATELET
Basophils Absolute: 0 10*3/uL (ref 0.0–0.1)
Basophils Relative: 0.4 % (ref 0.0–3.0)
Eosinophils Absolute: 0.2 10*3/uL (ref 0.0–0.7)
Eosinophils Relative: 1.6 % (ref 0.0–5.0)
HCT: 39.3 % (ref 36.0–46.0)
Hemoglobin: 13 g/dL (ref 12.0–15.0)
Lymphocytes Relative: 22.6 % (ref 12.0–46.0)
Lymphs Abs: 2.5 10*3/uL (ref 0.7–4.0)
MCHC: 33 g/dL (ref 30.0–36.0)
MCV: 99.8 fl (ref 78.0–100.0)
Monocytes Absolute: 0.9 10*3/uL (ref 0.1–1.0)
Monocytes Relative: 8.3 % (ref 3.0–12.0)
Neutro Abs: 7.3 10*3/uL (ref 1.4–7.7)
Neutrophils Relative %: 67.1 % (ref 43.0–77.0)
Platelets: 270 10*3/uL (ref 150.0–400.0)
RBC: 3.94 Mil/uL (ref 3.87–5.11)
RDW: 13.5 % (ref 11.5–15.5)
WBC: 10.9 10*3/uL — ABNORMAL HIGH (ref 4.0–10.5)

## 2023-05-07 LAB — LIPID PANEL
Cholesterol: 167 mg/dL (ref 0–200)
HDL: 74.3 mg/dL (ref >39.00)
LDL Cholesterol: 75 mg/dL (ref 0–99)
NonHDL: 92.97
Total CHOL/HDL Ratio: 2
Triglycerides: 90 mg/dL (ref 0.0–149.0)
VLDL: 18 mg/dL (ref 0.0–40.0)

## 2023-05-07 LAB — BASIC METABOLIC PANEL
BUN: 9 mg/dL (ref 6–23)
CO2: 29 mEq/L (ref 19–32)
Calcium: 9.2 mg/dL (ref 8.4–10.5)
Chloride: 103 mEq/L (ref 96–112)
Creatinine, Ser: 0.75 mg/dL (ref 0.40–1.20)
GFR: 79.84 mL/min (ref >60.00)
Glucose, Bld: 88 mg/dL (ref 70–99)
Potassium: 3.7 mEq/L (ref 3.5–5.1)
Sodium: 139 mEq/L (ref 135–145)

## 2023-05-07 LAB — TSH: TSH: 0.38 u[IU]/mL (ref 0.35–5.50)

## 2023-05-08 DIAGNOSIS — M542 Cervicalgia: Secondary | ICD-10-CM | POA: Diagnosis not present

## 2023-05-08 DIAGNOSIS — M545 Low back pain, unspecified: Secondary | ICD-10-CM | POA: Diagnosis not present

## 2023-05-11 DIAGNOSIS — M542 Cervicalgia: Secondary | ICD-10-CM | POA: Diagnosis not present

## 2023-05-11 DIAGNOSIS — M545 Low back pain, unspecified: Secondary | ICD-10-CM | POA: Diagnosis not present

## 2023-05-12 ENCOUNTER — Ambulatory Visit (INDEPENDENT_AMBULATORY_CARE_PROVIDER_SITE_OTHER): Payer: Medicare Other | Admitting: Internal Medicine

## 2023-05-12 ENCOUNTER — Encounter: Payer: Self-pay | Admitting: Internal Medicine

## 2023-05-12 VITALS — BP 136/72 | HR 68 | Temp 97.8°F | Resp 16 | Ht 60.0 in | Wt 101.8 lb

## 2023-05-12 DIAGNOSIS — M8580 Other specified disorders of bone density and structure, unspecified site: Secondary | ICD-10-CM | POA: Diagnosis not present

## 2023-05-12 DIAGNOSIS — F419 Anxiety disorder, unspecified: Secondary | ICD-10-CM

## 2023-05-12 DIAGNOSIS — E039 Hypothyroidism, unspecified: Secondary | ICD-10-CM | POA: Diagnosis not present

## 2023-05-12 DIAGNOSIS — N281 Cyst of kidney, acquired: Secondary | ICD-10-CM

## 2023-05-12 DIAGNOSIS — F439 Reaction to severe stress, unspecified: Secondary | ICD-10-CM | POA: Diagnosis not present

## 2023-05-12 DIAGNOSIS — I2510 Atherosclerotic heart disease of native coronary artery without angina pectoris: Secondary | ICD-10-CM

## 2023-05-12 DIAGNOSIS — J432 Centrilobular emphysema: Secondary | ICD-10-CM | POA: Diagnosis not present

## 2023-05-12 DIAGNOSIS — Z0000 Encounter for general adult medical examination without abnormal findings: Secondary | ICD-10-CM

## 2023-05-12 DIAGNOSIS — M542 Cervicalgia: Secondary | ICD-10-CM | POA: Diagnosis not present

## 2023-05-12 DIAGNOSIS — I779 Disorder of arteries and arterioles, unspecified: Secondary | ICD-10-CM | POA: Diagnosis not present

## 2023-05-12 DIAGNOSIS — I700 Atherosclerosis of aorta: Secondary | ICD-10-CM | POA: Diagnosis not present

## 2023-05-12 DIAGNOSIS — E7800 Pure hypercholesterolemia, unspecified: Secondary | ICD-10-CM

## 2023-05-12 MED ORDER — LEVOTHYROXINE SODIUM 112 MCG PO TABS: 112.0000 ug | ORAL_TABLET | Freq: Every day | ORAL | 1 refills | Status: DC

## 2023-05-12 MED ORDER — SERTRALINE HCL 25 MG PO TABS: ORAL_TABLET | ORAL | 1 refills | Status: DC

## 2023-05-12 NOTE — Progress Notes (Signed)
Subjective:    Patient ID: Leah Benitez, female    DOB: Jan 17, 1951, 72 y.o.   MRN: [id number]  Patient here for  Chief Complaint  Patient presents with   Annual Exam    HPI With past history of hypercholesterolemia, increased stress and osteoporosis, she comes in today to follow up on these issues as well as for a complete physical exam.  Received reclast 04/2023.  Tolerated. Seeing physiatry for her neck and back pain.  Evaluated 04/10/23 - placed on meloxicam.  Referred to PT.  PT has helped.  She is doing exercises at home.     Past Medical History:  Diagnosis Date   Allergy    Hyperlipidemia    Thyroid disease    Past Surgical History:  Procedure Laterality Date   ABDOMINAL HYSTERECTOMY     previous abnormal pap smear   BREAST BIOPSY     TONSILLECTOMY     Family History  Problem Relation Age of Onset   Arthritis Mother    Stroke Mother    Hypertension Mother    Arthritis Father    Heart disease Father    Heart disease Brother    Cancer Maternal Aunt        breast   Social History   Socioeconomic History   Marital status: Married    Spouse name: Not on file   Number of children: Not on file   Years of education: Not on file   Highest education level: 12th grade  Occupational History   Not on file  Tobacco Use   Smoking status: Every Day    Current packs/day: 1.00    Average packs/day: 1 pack/day for 47.0 years (47.0 ttl pk-yrs)    Types: Cigarettes   Smokeless tobacco: Never  Substance and Sexual Activity   Alcohol use: Yes    Alcohol/week: 7.0 standard drinks of alcohol    Types: 7 Glasses of wine per week    Comment: 1 glass of wine per night   Drug use: Not on file   Sexual activity: Not on file  Other Topics Concern   Not on file  Social History Narrative   Not on file   Social Determinants of Health   Financial Resource Strain: Low Risk  (01/05/2023)   Overall Financial Resource Strain (CARDIA)    Difficulty of Paying Living Expenses: Not  very hard  Food Insecurity: No Food Insecurity (01/05/2023)   Hunger Vital Sign    Worried About Running Out of Food in the Last Year: Never true    Ran Out of Food in the Last Year: Never true  Transportation Needs: No Transportation Needs (01/05/2023)   PRAPARE - Administrator, Civil Service (Medical): No    Lack of Transportation (Non-Medical): No  Physical Activity: Unknown (01/05/2023)   Exercise Vital Sign    Days of Exercise per Week: 2 days    Minutes of Exercise per Session: Not on file  Stress: Stress Concern Present (01/05/2023)   [employer] of Occupational Health - Occupational Stress Questionnaire    Feeling of Stress : To some extent  Social Connections: Moderately Integrated (01/05/2023)   Social Connection and Isolation Panel [NHANES]    Frequency of Communication with Friends and Family: More than three times a week    Frequency of Social Gatherings with Friends and Family: Once a week    Attends Religious Services: More than 4 times per year    Active Member of Clubs or Organizations: No  Attends Banker Meetings: Not on file    Marital Status: Married     Review of Systems  Constitutional:  Negative for appetite change and unexpected weight change.  HENT:  Negative for congestion, sinus pressure and sore throat.   Eyes:  Negative for pain and visual disturbance.  Respiratory:  Negative for cough, chest tightness and shortness of breath.   Cardiovascular:  Negative for chest pain, palpitations and leg swelling.  Gastrointestinal:  Negative for abdominal pain, diarrhea, nausea and vomiting.  Genitourinary:  Negative for difficulty urinating and dysuria.  Musculoskeletal:  Negative for joint swelling and myalgias.       Neck and back pain.    Skin:  Negative for color change and rash.  Neurological:  Negative for dizziness and headaches.  Hematological:  Negative for adenopathy. Does not bruise/bleed easily.  Psychiatric/Behavioral:   Negative for agitation and dysphoric mood.        Objective:     BP 136/72   Pulse 68   Temp 97.8 F (36.6 C)   Resp 16   Ht 5' (1.524 m)   Wt 101 lb 12.8 oz (46.2 kg)   SpO2 97%   BMI 19.88 kg/m  Wt Readings from Last 3 Encounters:  05/12/23 101 lb 12.8 oz (46.2 kg)  03/25/23 103 lb (46.7 kg)  01/06/23 102 lb (46.3 kg)    Physical Exam Vitals reviewed.  Constitutional:      General: She is not in acute distress.    Appearance: Normal appearance. She is well-developed.  HENT:     Head: Normocephalic and atraumatic.     Right Ear: External ear normal.     Left Ear: External ear normal.  Eyes:     General: No scleral icterus.       Right eye: No discharge.        Left eye: No discharge.     Conjunctiva/sclera: Conjunctivae normal.  Neck:     Thyroid: No thyromegaly.  Cardiovascular:     Rate and Rhythm: Normal rate and regular rhythm.  Pulmonary:     Effort: No tachypnea, accessory muscle usage or respiratory distress.     Breath sounds: Normal breath sounds. No decreased breath sounds or wheezing.  Chest:  Breasts:    Right: No inverted nipple, mass, nipple discharge or tenderness (no axillary adenopathy).     Left: No inverted nipple, mass, nipple discharge or tenderness (no axilarry adenopathy).  Abdominal:     General: Bowel sounds are normal.     Palpations: Abdomen is soft.     Tenderness: There is no abdominal tenderness.  Musculoskeletal:        General: No swelling or tenderness.     Cervical back: Neck supple.  Lymphadenopathy:     Cervical: No cervical adenopathy.  Skin:    Findings: No erythema or rash.  Neurological:     Mental Status: She is alert and oriented to person, place, and time.  Psychiatric:        Mood and Affect: Mood normal.        Behavior: Behavior normal.      Outpatient Encounter Medications as of 05/12/2023  Medication Sig   albuterol (VENTOLIN HFA) 108 (90 Base) MCG/ACT inhaler Inhale 2 puffs into the lungs every 6  (six) hours as needed for wheezing or shortness of breath.   aspirin EC 81 MG tablet Take 1 tablet (81 mg total) by mouth daily. Swallow whole.   atorvastatin (LIPITOR) 20 MG tablet  TAKE 1 TABLET BY MOUTH AT BEDTIME FOR CHOLESTEROL   cholecalciferol (VITAMIN D) 1000 units tablet Take 2,000 Units by mouth daily.   Cyanocobalamin (VITAMIN B12) 1000 MCG TBCR    Esomeprazole Magnesium (NEXIUM PO) Take by mouth.   fluticasone (FLONASE) 50 MCG/ACT nasal spray Place 2 sprays into both nostrils daily.   levothyroxine (SYNTHROID) 112 MCG tablet Take 1 tablet (112 mcg total) by mouth daily.   sertraline (ZOLOFT) 25 MG tablet Take 1-2 tablets by mouth daily   tizanidine (ZANAFLEX) 2 MG capsule Take 1 capsule (2 mg total) by mouth at bedtime as needed for muscle spasms.   verapamil (CALAN-SR) 180 MG CR tablet Take 180 mg by mouth daily.   [DISCONTINUED] levothyroxine (SYNTHROID) 112 MCG tablet TAKE ONE TABLET BY MOUTH ONCE DAILY   [DISCONTINUED] sertraline (ZOLOFT) 25 MG tablet Take 1-2 tablets by mouth daily   No facility-administered encounter medications on file as of 05/12/2023.     Lab Results  Component Value Date   WBC 10.9 (H) 05/07/2023   HGB 13.0 05/07/2023   HCT 39.3 05/07/2023   PLT 270.0 05/07/2023   GLUCOSE 88 05/07/2023   CHOL 167 05/07/2023   TRIG 90.0 05/07/2023   HDL 74.30 05/07/2023   LDLCALC 75 05/07/2023   ALT 18 05/07/2023   AST 20 05/07/2023   NA 139 05/07/2023   K 3.7 05/07/2023   CL 103 05/07/2023   CREATININE 0.75 05/07/2023   BUN 9 05/07/2023   CO2 29 05/07/2023   TSH 0.38 05/07/2023    CT CHEST LUNG CA SCREEN LOW DOSE W/O CM  Result Date: 03/04/2023 CLINICAL DATA:  72 year old female with 49 pack year history of smoking. Lung cancer screening. EXAM: CT CHEST WITHOUT CONTRAST LOW-DOSE FOR LUNG CANCER SCREENING TECHNIQUE: Multidetector CT imaging of the chest was performed following the standard protocol without IV contrast. RADIATION DOSE REDUCTION: This exam  was performed according to the departmental dose-optimization program which includes automated exposure control, adjustment of the mA and/or kV according to patient size and/or use of iterative reconstruction technique. COMPARISON:  02/25/2022 FINDINGS: Cardiovascular: The heart size is normal. No substantial pericardial effusion. Coronary artery calcification is evident. Mild atherosclerotic calcification is noted in the wall of the thoracic aorta. Mediastinum/Nodes: No mediastinal lymphadenopathy. No evidence for gross hilar lymphadenopathy although assessment is limited by the lack of intravenous contrast on the current study. The esophagus has normal imaging features. There is no axillary lymphadenopathy. Lungs/Pleura: Centrilobular and paraseptal emphysema evident. Biapical pleuroparenchymal scarring again noted. Scattered tiny pulmonary nodules identified previously are stable in the interval. There is no new suspicious pulmonary nodule or mass. No focal airspace consolidation. No pleural effusion. Upper Abdomen: Unremarkable. Musculoskeletal: No worrisome lytic or sclerotic osseous abnormality. Superior endplate compression deformity at L1 appears to be new in the interval since the prior study. IMPRESSION: 1. Lung-RADS 2, benign appearance or behavior. Continue annual screening with low-dose chest CT without contrast in 12 months. 2. Superior endplate compression deformity at L1 appears to be new in the interval since the prior study. 3.  Aortic Atherosclerosis (ICD10-I70.0). Electronically Signed   By: Kennith Center M.D.   On: 03/04/2023 08:21      Assessment & Plan:  Healthcare maintenance Assessment & Plan: Physical today 05/12/23. Mammogram 05/02/22 - Briads I. Schedule f/u colonoscopy. Colonoscopy - 10/2022 - tubular adenoma.  Recommended f/u colonoscopy in 3 years.    Hypercholesterolemia Assessment & Plan: Continue lipitor.  Low cholesterol diet and exercise.  Follow lipid panel  and liver  function tests.    Orders: -     Lipid panel; Future -     Hepatic function panel; Future -     Basic metabolic panel; Future -     CBC with Differential/Platelet; Future  Anxiety Assessment & Plan: On zoloft. Does not feel needs any further intervention.  Follow.     Aortic atherosclerosis (HCC) Assessment & Plan: Continue lipitor.    Coronary artery disease involving native coronary artery of native heart without angina pectoris Assessment & Plan: Previous CT chest - calcification in aorta and Left main and 3 vessel disease.  Have discussed risk factor modification. have discussed the need to quit smoking.  Family history.  Saw cardiology.  myoview unremarkable.  ECHO with preserved LV function.  Continue medical management. No changes.    Carotid artery disease, unspecified laterality, unspecified type [facility]) Assessment & Plan: Life line screening - 10/21/21 - mild carotid artery disease.  Continue statin and adequate blood pressure control.  Follow.    Centrilobular emphysema (HCC) Assessment & Plan: No increased cough or congestion.  Breathing overall stable.    Hypothyroidism, unspecified type Assessment & Plan: On synthroid.  Follow tsh.     Osteopenia, unspecified location Assessment & Plan: Endocrinology - 03/2023 - recommended IV reclast.  Recommended f/u bone density 06/2024.    Renal cyst Assessment & Plan: Was evaluated (08/2021) - Dr Apolinar Junes - recommended f/u in 2 years.     Stress Assessment & Plan: On zoloft.  Does not feel needs any further intervention at this time.  Follow.    Neck pain Assessment & Plan: Seeing physiatry for her neck and back pain.  Evaluated 04/10/23 - placed on meloxicam.  Referred to PT.  PT has helped.  She is doing exercises at home.     Other orders -     Levothyroxine Sodium; Take 1 tablet (112 mcg total) by mouth daily.  Dispense: 90 tablet; Refill: 1 -     Sertraline HCl; Take 1-2 tablets by mouth daily  Dispense: 180  tablet; Refill: 1     Dale Cadiz, MD

## 2023-05-12 NOTE — Assessment & Plan Note (Addendum)
Physical today 05/12/23. Mammogram 05/02/22 - Briads I. Schedule f/u colonoscopy. Colonoscopy - 10/2022 - tubular adenoma.  Recommended f/u colonoscopy in 3 years.

## 2023-05-13 DIAGNOSIS — M545 Low back pain, unspecified: Secondary | ICD-10-CM | POA: Diagnosis not present

## 2023-05-13 DIAGNOSIS — M542 Cervicalgia: Secondary | ICD-10-CM | POA: Diagnosis not present

## 2023-05-17 ENCOUNTER — Encounter: Payer: Self-pay | Admitting: Internal Medicine

## 2023-05-17 NOTE — Assessment & Plan Note (Signed)
Was evaluated (08/2021) - Dr Erlene Quan - recommended f/u in 2 years.

## 2023-05-17 NOTE — Assessment & Plan Note (Signed)
No increased cough or congestion.  Breathing overall stable.  

## 2023-05-17 NOTE — Assessment & Plan Note (Signed)
Continue lipitor.  Low cholesterol diet and exercise.  Follow lipid panel and liver function tests.   

## 2023-05-17 NOTE — Assessment & Plan Note (Signed)
Previous CT chest - calcification in aorta and Left main and 3 vessel disease.  Have discussed risk factor modification. have discussed the need to quit smoking.  Family history.  Saw cardiology.  myoview unremarkable.  ECHO with preserved LV function.  Continue medical management. No changes.

## 2023-05-17 NOTE — Assessment & Plan Note (Signed)
On zoloft.  Does not feel needs any further intervention at this time.  Follow.  

## 2023-05-17 NOTE — Assessment & Plan Note (Signed)
Endocrinology - 03/2023 - recommended IV reclast.  Recommended f/u bone density 06/2024.

## 2023-05-17 NOTE — Assessment & Plan Note (Signed)
On zoloft. Does not feel needs any further intervention.  Follow.

## 2023-05-17 NOTE — Assessment & Plan Note (Signed)
Seeing physiatry for her neck and back pain.  Evaluated 04/10/23 - placed on meloxicam.  Referred to PT.  PT has helped.  She is doing exercises at home.

## 2023-05-17 NOTE — Assessment & Plan Note (Signed)
Life line screening - 10/21/21 - mild carotid artery disease.  Continue statin and adequate blood pressure control.  Follow.  

## 2023-05-17 NOTE — Assessment & Plan Note (Signed)
Continue lipitor  ?

## 2023-05-17 NOTE — Assessment & Plan Note (Signed)
On synthroid.  Follow tsh.   

## 2023-05-20 DIAGNOSIS — M542 Cervicalgia: Secondary | ICD-10-CM | POA: Diagnosis not present

## 2023-05-20 DIAGNOSIS — M545 Low back pain, unspecified: Secondary | ICD-10-CM | POA: Diagnosis not present

## 2023-05-27 DIAGNOSIS — M545 Low back pain, unspecified: Secondary | ICD-10-CM | POA: Diagnosis not present

## 2023-05-27 DIAGNOSIS — M542 Cervicalgia: Secondary | ICD-10-CM | POA: Diagnosis not present

## 2023-06-02 DIAGNOSIS — M542 Cervicalgia: Secondary | ICD-10-CM | POA: Diagnosis not present

## 2023-06-02 DIAGNOSIS — M545 Low back pain, unspecified: Secondary | ICD-10-CM | POA: Diagnosis not present

## 2023-06-04 DIAGNOSIS — M545 Low back pain, unspecified: Secondary | ICD-10-CM | POA: Diagnosis not present

## 2023-06-04 DIAGNOSIS — M542 Cervicalgia: Secondary | ICD-10-CM | POA: Diagnosis not present

## 2023-06-09 DIAGNOSIS — M542 Cervicalgia: Secondary | ICD-10-CM | POA: Diagnosis not present

## 2023-06-09 DIAGNOSIS — M545 Low back pain, unspecified: Secondary | ICD-10-CM | POA: Diagnosis not present

## 2023-06-11 DIAGNOSIS — M545 Low back pain, unspecified: Secondary | ICD-10-CM | POA: Diagnosis not present

## 2023-06-11 DIAGNOSIS — M542 Cervicalgia: Secondary | ICD-10-CM | POA: Diagnosis not present

## 2023-06-12 DIAGNOSIS — Z79899 Other long term (current) drug therapy: Secondary | ICD-10-CM | POA: Diagnosis not present

## 2023-06-12 DIAGNOSIS — I499 Cardiac arrhythmia, unspecified: Secondary | ICD-10-CM | POA: Diagnosis not present

## 2023-06-12 DIAGNOSIS — G44011 Episodic cluster headache, intractable: Secondary | ICD-10-CM | POA: Diagnosis not present

## 2023-06-16 DIAGNOSIS — M545 Low back pain, unspecified: Secondary | ICD-10-CM | POA: Diagnosis not present

## 2023-06-16 DIAGNOSIS — M542 Cervicalgia: Secondary | ICD-10-CM | POA: Diagnosis not present

## 2023-06-18 DIAGNOSIS — M542 Cervicalgia: Secondary | ICD-10-CM | POA: Diagnosis not present

## 2023-06-18 DIAGNOSIS — M545 Low back pain, unspecified: Secondary | ICD-10-CM | POA: Diagnosis not present

## 2023-06-22 DIAGNOSIS — I499 Cardiac arrhythmia, unspecified: Secondary | ICD-10-CM | POA: Diagnosis not present

## 2023-06-23 DIAGNOSIS — M545 Low back pain, unspecified: Secondary | ICD-10-CM | POA: Diagnosis not present

## 2023-06-23 DIAGNOSIS — M542 Cervicalgia: Secondary | ICD-10-CM | POA: Diagnosis not present

## 2023-06-26 DIAGNOSIS — M545 Low back pain, unspecified: Secondary | ICD-10-CM | POA: Diagnosis not present

## 2023-06-26 DIAGNOSIS — M542 Cervicalgia: Secondary | ICD-10-CM | POA: Diagnosis not present

## 2023-06-29 DIAGNOSIS — Z1231 Encounter for screening mammogram for malignant neoplasm of breast: Secondary | ICD-10-CM | POA: Diagnosis not present

## 2023-06-29 LAB — HM MAMMOGRAPHY

## 2023-07-13 ENCOUNTER — Other Ambulatory Visit: Payer: Self-pay | Admitting: Family Medicine

## 2023-07-13 DIAGNOSIS — M5030 Other cervical disc degeneration, unspecified cervical region: Secondary | ICD-10-CM

## 2023-07-13 DIAGNOSIS — M5136 Other intervertebral disc degeneration, lumbar region: Secondary | ICD-10-CM | POA: Diagnosis not present

## 2023-07-13 DIAGNOSIS — M47812 Spondylosis without myelopathy or radiculopathy, cervical region: Secondary | ICD-10-CM | POA: Diagnosis not present

## 2023-07-30 ENCOUNTER — Telehealth: Payer: Self-pay | Admitting: Internal Medicine

## 2023-07-30 NOTE — Telephone Encounter (Signed)
Patient called and said she tested positive today for Covid and wanted to know if she could get the antiviral medication. Symptoms are: runny nose, cough, headache, no fever, some body aches, she thinks, she states she always has body aches.

## 2023-07-30 NOTE — Telephone Encounter (Signed)
Patient scheduled for virtual visit with Dr Darrick Huntsman to discuss antiviral

## 2023-07-31 ENCOUNTER — Telehealth: Payer: Medicare Other | Admitting: Internal Medicine

## 2023-08-03 ENCOUNTER — Ambulatory Visit
Admission: RE | Admit: 2023-08-03 | Discharge: 2023-08-03 | Disposition: A | Discharge status: Home / Self Care | Payer: Medicare Other | Source: Ambulatory Visit | Attending: Family Medicine | Admitting: Family Medicine

## 2023-08-03 DIAGNOSIS — M4312 Spondylolisthesis, cervical region: Secondary | ICD-10-CM | POA: Diagnosis not present

## 2023-08-03 DIAGNOSIS — M4802 Spinal stenosis, cervical region: Secondary | ICD-10-CM | POA: Diagnosis not present

## 2023-08-03 DIAGNOSIS — M47812 Spondylosis without myelopathy or radiculopathy, cervical region: Secondary | ICD-10-CM

## 2023-08-03 DIAGNOSIS — M5030 Other cervical disc degeneration, unspecified cervical region: Secondary | ICD-10-CM

## 2023-08-19 DIAGNOSIS — H2513 Age-related nuclear cataract, bilateral: Secondary | ICD-10-CM | POA: Diagnosis not present

## 2023-08-19 DIAGNOSIS — H524 Presbyopia: Secondary | ICD-10-CM | POA: Diagnosis not present

## 2023-08-24 DIAGNOSIS — M7918 Myalgia, other site: Secondary | ICD-10-CM | POA: Diagnosis not present

## 2023-08-24 DIAGNOSIS — M47812 Spondylosis without myelopathy or radiculopathy, cervical region: Secondary | ICD-10-CM | POA: Diagnosis not present

## 2023-08-24 DIAGNOSIS — M47816 Spondylosis without myelopathy or radiculopathy, lumbar region: Secondary | ICD-10-CM | POA: Diagnosis not present

## 2023-08-24 DIAGNOSIS — M5412 Radiculopathy, cervical region: Secondary | ICD-10-CM | POA: Diagnosis not present

## 2023-08-28 DIAGNOSIS — Z23 Encounter for immunization: Secondary | ICD-10-CM | POA: Diagnosis not present

## 2023-09-14 ENCOUNTER — Other Ambulatory Visit (INDEPENDENT_AMBULATORY_CARE_PROVIDER_SITE_OTHER): Payer: Medicare Other

## 2023-09-14 DIAGNOSIS — E7800 Pure hypercholesterolemia, unspecified: Secondary | ICD-10-CM

## 2023-09-14 LAB — LIPID PANEL
Cholesterol: 197 mg/dL (ref 0–200)
HDL: 72.7 mg/dL (ref >39.00)
LDL Cholesterol: 103 mg/dL — ABNORMAL HIGH (ref 0–99)
NonHDL: 124.34
Total CHOL/HDL Ratio: 3
Triglycerides: 105 mg/dL (ref 0.0–149.0)
VLDL: 21 mg/dL (ref 0.0–40.0)

## 2023-09-14 LAB — BASIC METABOLIC PANEL
BUN: 9 mg/dL (ref 6–23)
CO2: 30 meq/L (ref 19–32)
Calcium: 9 mg/dL (ref 8.4–10.5)
Chloride: 100 meq/L (ref 96–112)
Creatinine, Ser: 0.65 mg/dL (ref 0.40–1.20)
GFR: 88.07 mL/min (ref >60.00)
Glucose, Bld: 89 mg/dL (ref 70–99)
Potassium: 4.3 meq/L (ref 3.5–5.1)
Sodium: 137 meq/L (ref 135–145)

## 2023-09-14 LAB — HEPATIC FUNCTION PANEL
ALT: 15 U/L (ref 0–35)
AST: 19 U/L (ref 0–37)
Albumin: 4.2 g/dL (ref 3.5–5.2)
Alkaline Phosphatase: 42 U/L (ref 39–117)
Bilirubin, Direct: 0.1 mg/dL (ref 0.0–0.3)
Total Bilirubin: 0.6 mg/dL (ref 0.2–1.2)
Total Protein: 6.5 g/dL (ref 6.0–8.3)

## 2023-09-14 LAB — CBC WITH DIFFERENTIAL/PLATELET
Basophils Absolute: 0.1 10*3/uL (ref 0.0–0.1)
Basophils Relative: 0.7 % (ref 0.0–3.0)
Eosinophils Absolute: 0.1 10*3/uL (ref 0.0–0.7)
Eosinophils Relative: 1.1 % (ref 0.0–5.0)
HCT: 40.4 % (ref 36.0–46.0)
Hemoglobin: 13.5 g/dL (ref 12.0–15.0)
Lymphocytes Relative: 29 % (ref 12.0–46.0)
Lymphs Abs: 2.5 10*3/uL (ref 0.7–4.0)
MCHC: 33.3 g/dL (ref 30.0–36.0)
MCV: 100.2 fL — ABNORMAL HIGH (ref 78.0–100.0)
Monocytes Absolute: 0.6 10*3/uL (ref 0.1–1.0)
Monocytes Relative: 7.1 % (ref 3.0–12.0)
Neutro Abs: 5.4 10*3/uL (ref 1.4–7.7)
Neutrophils Relative %: 62.1 % (ref 43.0–77.0)
Platelets: 290 10*3/uL (ref 150.0–400.0)
RBC: 4.04 Mil/uL (ref 3.87–5.11)
RDW: 14 % (ref 11.5–15.5)
WBC: 8.7 10*3/uL (ref 4.0–10.5)

## 2023-09-17 ENCOUNTER — Ambulatory Visit: Payer: Medicare Other | Admitting: Internal Medicine

## 2023-09-17 ENCOUNTER — Ambulatory Visit: Payer: Medicare Other

## 2023-09-17 VITALS — BP 138/72 | HR 92 | Temp 98.0°F | Resp 18 | Ht 60.0 in | Wt 99.2 lb

## 2023-09-17 DIAGNOSIS — E039 Hypothyroidism, unspecified: Secondary | ICD-10-CM | POA: Diagnosis not present

## 2023-09-17 DIAGNOSIS — R0602 Shortness of breath: Secondary | ICD-10-CM | POA: Diagnosis not present

## 2023-09-17 DIAGNOSIS — I779 Disorder of arteries and arterioles, unspecified: Secondary | ICD-10-CM

## 2023-09-17 DIAGNOSIS — I700 Atherosclerosis of aorta: Secondary | ICD-10-CM

## 2023-09-17 DIAGNOSIS — Z8616 Personal history of COVID-19: Secondary | ICD-10-CM | POA: Diagnosis not present

## 2023-09-17 DIAGNOSIS — M8580 Other specified disorders of bone density and structure, unspecified site: Secondary | ICD-10-CM | POA: Diagnosis not present

## 2023-09-17 DIAGNOSIS — Z720 Tobacco use: Secondary | ICD-10-CM | POA: Diagnosis not present

## 2023-09-17 DIAGNOSIS — I2510 Atherosclerotic heart disease of native coronary artery without angina pectoris: Secondary | ICD-10-CM

## 2023-09-17 DIAGNOSIS — E7800 Pure hypercholesterolemia, unspecified: Secondary | ICD-10-CM

## 2023-09-17 DIAGNOSIS — R0600 Dyspnea, unspecified: Secondary | ICD-10-CM

## 2023-09-17 DIAGNOSIS — R059 Cough, unspecified: Secondary | ICD-10-CM

## 2023-09-17 DIAGNOSIS — R053 Chronic cough: Secondary | ICD-10-CM | POA: Diagnosis not present

## 2023-09-17 DIAGNOSIS — J432 Centrilobular emphysema: Secondary | ICD-10-CM | POA: Diagnosis not present

## 2023-09-17 DIAGNOSIS — R918 Other nonspecific abnormal finding of lung field: Secondary | ICD-10-CM | POA: Diagnosis not present

## 2023-09-17 MED ORDER — PREDNISONE 10 MG PO TABS: ORAL_TABLET | ORAL | 0 refills | Status: DC

## 2023-09-17 MED ORDER — ALBUTEROL SULFATE HFA 108 (90 BASE) MCG/ACT IN AERS: 2.0000 | INHALATION_SPRAY | Freq: Four times a day (QID) | RESPIRATORY_TRACT | 0 refills | Status: DC | PRN

## 2023-09-17 MED ORDER — DOXYCYCLINE HYCLATE 100 MG PO TABS: 100.0000 mg | ORAL_TABLET | Freq: Two times a day (BID) | ORAL | 0 refills | Status: DC

## 2023-09-17 NOTE — Progress Notes (Signed)
Subjective:    Patient ID: Leah Benitez, female    DOB: 1951/02/22, 72 y.o.   MRN: [id number]  Patient here for  Chief Complaint  Patient presents with   Medical Management of Chronic Issues    HPI Here for a follow up appt. F/u with Dr Mickeal Needy Meeler - f/u regarding neck and low back pain.  Treated with meloxicam and recommend to continue with PT. Trigger poin injection - left C5 paraspinal muscle. This injection did not help.  Scheduled for ESI left C4-5.  She reports that she had covid 2 months ago.  Has  noticed over the last two weeks, increased cough/congestion.  Also increased sob with exertion.  No acid reflux reported. No fever. No nausea or vomiting.  No diarrhea. Productive mucus - occasionally yellow.  Did notice - specks of blood in mucus (tinged), but no gross hemoptysis.  Coughing fits. Discussed labs.  Continues to smoke.    Past Medical History:  Diagnosis Date   Allergy    Hyperlipidemia    Thyroid disease    Past Surgical History:  Procedure Laterality Date   ABDOMINAL HYSTERECTOMY     previous abnormal pap smear   BREAST BIOPSY     TONSILLECTOMY     Family History  Problem Relation Age of Onset   Arthritis Mother    Stroke Mother    Hypertension Mother    Arthritis Father    Heart disease Father    Heart disease Brother    Cancer Maternal Aunt        breast   Social History   Socioeconomic History   Marital status: Married    Spouse name: Not on file   Number of children: Not on file   Years of education: Not on file   Highest education level: 12th grade  Occupational History   Not on file  Tobacco Use   Smoking status: Every Day    Current packs/day: 1.00    Average packs/day: 1 pack/day for 47.0 years (47.0 ttl pk-yrs)    Types: Cigarettes   Smokeless tobacco: Never  Substance and Sexual Activity   Alcohol use: Yes    Alcohol/week: 7.0 standard drinks of alcohol    Types: 7 Glasses of wine per week    Comment: 1 glass of wine  per night   Drug use: Not on file   Sexual activity: Not on file  Other Topics Concern   Not on file  Social History Narrative   Not on file   Social Determinants of Health   Financial Resource Strain: Low Risk  (09/16/2023)   Overall Financial Resource Strain (CARDIA)    Difficulty of Paying Living Expenses: Not hard at all  Food Insecurity: No Food Insecurity (09/16/2023)   Hunger Vital Sign    Worried About Running Out of Food in the Last Year: Never true    Ran Out of Food in the Last Year: Never true  Transportation Needs: No Transportation Needs (09/16/2023)   PRAPARE - Administrator, Civil Service (Medical): No    Lack of Transportation (Non-Medical): No  Physical Activity: Unknown (09/16/2023)   Exercise Vital Sign    Days of Exercise per Week: 0 days    Minutes of Exercise per Session: Not on file  Stress: Stress Concern Present (09/16/2023)   [employer] of Occupational Health - Occupational Stress Questionnaire    Feeling of Stress : To some extent  Social Connections: Socially Integrated (09/16/2023)  Social Advertising account executive [NHANES]    Frequency of Communication with Friends and Family: More than three times a week    Frequency of Social Gatherings with Friends and Family: Once a week    Attends Religious Services: More than 4 times per year    Active Member of [employer] or Organizations: Yes    Attends Banker Meetings: Not on file    Marital Status: Married     Review of Systems  Constitutional:  Negative for appetite change and unexpected weight change.  HENT:  Positive for congestion. Negative for sinus pressure.   Respiratory:  Positive for cough, shortness of breath and wheezing. Negative for chest tightness.   Cardiovascular:  Negative for chest pain, palpitations and leg swelling.  Gastrointestinal:  Negative for abdominal pain, diarrhea, nausea and vomiting.  Genitourinary:  Negative for difficulty urinating and  dysuria.  Musculoskeletal:  Negative for joint swelling and myalgias.  Skin:  Negative for color change and rash.  Neurological:  Negative for dizziness and headaches.  Psychiatric/Behavioral:  Negative for agitation and dysphoric mood.        Objective:     BP 138/72   Pulse 92   Temp 98 F (36.7 C)   Resp 18   Ht 5' (1.524 m)   Wt 99 lb 3.2 oz (45 kg)   SpO2 95%   BMI 19.37 kg/m  Wt Readings from Last 3 Encounters:  09/17/23 99 lb 3.2 oz (45 kg)  05/12/23 101 lb 12.8 oz (46.2 kg)  03/25/23 103 lb (46.7 kg)    Physical Exam Vitals reviewed.  Constitutional:      General: She is not in acute distress.    Appearance: Normal appearance.  HENT:     Head: Normocephalic and atraumatic.     Right Ear: External ear normal.     Left Ear: External ear normal.  Eyes:     General: No scleral icterus.       Right eye: No discharge.        Left eye: No discharge.     Conjunctiva/sclera: Conjunctivae normal.  Neck:     Thyroid: No thyromegaly.  Cardiovascular:     Rate and Rhythm: Normal rate and regular rhythm.  Pulmonary:     Comments: Increased cough with expiration.  Decreased air movement.  Xopenex neb given in office.  Abdominal:     General: Bowel sounds are normal.     Palpations: Abdomen is soft.     Tenderness: There is no abdominal tenderness.  Musculoskeletal:        General: No swelling or tenderness.     Cervical back: Neck supple. No tenderness.  Lymphadenopathy:     Cervical: No cervical adenopathy.  Skin:    Findings: No erythema or rash.  Neurological:     Mental Status: She is alert.  Psychiatric:        Mood and Affect: Mood normal.        Behavior: Behavior normal.      Outpatient Encounter Medications as of 09/17/2023  Medication Sig   doxycycline (VIBRA-TABS) 100 MG tablet Take 1 tablet (100 mg total) by mouth 2 (two) times daily.   predniSONE (DELTASONE) 10 MG tablet Take 6 tablets x 1 day and then decrease by 1/2 tablet per day until  down to zero mg.   albuterol (VENTOLIN HFA) 108 (90 Base) MCG/ACT inhaler Inhale 2 puffs into the lungs every 6 (six) hours as needed for wheezing or shortness  of breath.   aspirin EC 81 MG tablet Take 1 tablet (81 mg total) by mouth daily. Swallow whole.   atorvastatin (LIPITOR) 20 MG tablet TAKE 1 TABLET BY MOUTH AT BEDTIME FOR CHOLESTEROL   cholecalciferol (VITAMIN D) 1000 units tablet Take 2,000 Units by mouth daily.   Cyanocobalamin (VITAMIN B12) 1000 MCG TBCR    Esomeprazole Magnesium (NEXIUM PO) Take by mouth.   fluticasone (FLONASE) 50 MCG/ACT nasal spray Place 2 sprays into both nostrils daily.   levothyroxine (SYNTHROID) 112 MCG tablet Take 1 tablet (112 mcg total) by mouth daily.   sertraline (ZOLOFT) 25 MG tablet Take 1-2 tablets by mouth daily   verapamil (CALAN-SR) 180 MG CR tablet Take 180 mg by mouth daily.   [DISCONTINUED] albuterol (VENTOLIN HFA) 108 (90 Base) MCG/ACT inhaler Inhale 2 puffs into the lungs every 6 (six) hours as needed for wheezing or shortness of breath.   [DISCONTINUED] tizanidine (ZANAFLEX) 2 MG capsule Take 1 capsule (2 mg total) by mouth at bedtime as needed for muscle spasms.   No facility-administered encounter medications on file as of 09/17/2023.     Lab Results  Component Value Date   WBC 8.7 09/14/2023   HGB 13.5 09/14/2023   HCT 40.4 09/14/2023   PLT 290.0 09/14/2023   GLUCOSE 89 09/14/2023   CHOL 197 09/14/2023   TRIG 105.0 09/14/2023   HDL 72.70 09/14/2023   LDLCALC 103 (H) 09/14/2023   ALT 15 09/14/2023   AST 19 09/14/2023   NA 137 09/14/2023   K 4.3 09/14/2023   CL 100 09/14/2023   CREATININE 0.65 09/14/2023   BUN 9 09/14/2023   CO2 30 09/14/2023   TSH 0.38 05/07/2023    MR CERVICAL SPINE WO CONTRAST  Result Date: 08/20/2023 CLINICAL DATA:  72 year old female with spondylosis, left upper extremity symptoms, worsening pain. EXAM: MRI CERVICAL SPINE WITHOUT CONTRAST TECHNIQUE: Multiplanar, multisequence MR imaging of the cervical  spine was performed. No intravenous contrast was administered. COMPARISON:  Cervical radiographs 03/25/2023. FINDINGS: Alignment: Stable. Straightening and mild reversal of lordosis as seen in [place]. Associated subtle degenerative appearing anterolisthesis C4 on C5. Vertebrae: Visualized bone marrow signal is within normal limits. No marrow edema or evidence of acute osseous abnormality. Cord: No spinal cord signal abnormality. Borderline to mild degenerative lower cord mass effect, see details below. Posterior Fossa, vertebral arteries, paraspinal tissues: Negative cervicomedullary junction. Patchy T2 and STIR hyperintensity in the pons. Otherwise negative visible posterior fossa, brain parenchyma. Preserved major vascular flow voids in the neck. The right vertebral artery has a late entry into the cervical transverse foramen on series 109, image 17, and appears mildly dominant. Negative visible neck soft tissues, lung apices. Disc levels: C2-C3: Mild to moderate facet hypertrophy greater on the right. No stenosis. C3-C4: Small central disc protrusion (series 110, image 13). Mild facet hypertrophy. No significant spinal stenosis. Mild left C4 foraminal stenosis. C4-C5: Mild anterolisthesis. Moderate bilateral facet hypertrophy. Mild to moderate ligament flavum hypertrophy. Mild disc bulge. Mild spinal stenosis. No convincing cord mass effect. Moderate bilateral C5 foraminal stenosis. C5-C6: Disc space loss. Circumferential disc osteophyte complex with a broad-based posterior component (series 109, image 22). Mild facet hypertrophy. Mild spinal stenosis and mild ventral cord mass effect. Severe left, moderate right C7 foraminal stenosis. C6-C7: Similar disc space loss and circumferential disc osteophyte complex. Mild facet and ligament flavum hypertrophy. No significant spinal stenosis. Moderate to severe left greater than right C7 foraminal stenosis. C7-T1: Mild to moderate facet hypertrophy. No spinal stenosis.  Borderline to mild right C8 foraminal stenosis. Capacious visible upper thoracic spinal canal. IMPRESSION: 1. Widespread cervical spine degeneration with mild degenerative anterolisthesis C4 on C5. No acute osseous abnormality. 2. Mild multifactorial spinal stenosis at C4-C5 through C6-C7. Mild cord mass effect at C5-C6. No spinal cord signal abnormality. 3. Associated moderate, up to severe degenerative neural foraminal stenosis at the bilateral C4 through C7 nerve levels. Electronically Signed   By: Odessa Fleming M.D.   On: 08/20/2023 10:41       Assessment & Plan:  Dyspnea, unspecified type -     EKG 12-Lead -     DG Chest 2 View; Future  SOB (shortness of breath) Assessment & Plan: Has had previous cardiac w/up as outlined in previous note.  With increased cough, congestion, feel sob related to the current infection and her history of smoking. EKG - SR with no acute ischemic changes. Treat current infection.  Check cxr. Discussed ER evaluation. She wants to hold on ER evaluation, stating unable to go at this time.  Follow closely.  Call with update.   Orders: -     DG Chest 2 View; Future  Tobacco abuse Assessment & Plan: Have discussed the need to quit.  Declines to quit at this time.  Follow.    Osteopenia, unspecified location Assessment & Plan: Endocrinology - 03/2023 - recommended IV reclast.  Recommended f/u bone density 06/2024.    Hypothyroidism, unspecified type Assessment & Plan: On synthroid.  Follow tsh.     Hypercholesterolemia Assessment & Plan: Continue lipitor.  Low cholesterol diet and exercise.  Follow lipid panel and liver function tests.   Lab Results  Component Value Date   CHOL 197 09/14/2023   HDL 72.70 09/14/2023   LDLCALC 103 (H) 09/14/2023   TRIG 105.0 09/14/2023   CHOLHDL 3 09/14/2023      Centrilobular emphysema (HCC) Assessment & Plan: With increased cough and congestion as outlined. Treat infection as outlined. Check cxr.  Albuterol inhaler.   Follow.    Cough, unspecified type Assessment & Plan: Increased cough and congestion as outlined.  Appear to be c/w URI/bronchitis.  Given xopenex neb here in the office.  Tolerated. Pulse ox post neb 95% room air. Will treat with doxycycline and prednisone taper as directed.  Check cxr. Albuterol inhaler.  Follow.  Call with update tomorrow.  Declined ER evaluation. Any change or worsening symptoms, she will be evaluated.    Carotid artery disease, unspecified laterality, unspecified type [facility]) Assessment & Plan: Life line screening - 10/21/21 - mild carotid artery disease.  Continue statin and adequate blood pressure control.  Follow.    Coronary artery disease involving native coronary artery of native heart without angina pectoris Assessment & Plan: Previous CT chest - calcification in aorta and Left main and 3 vessel disease.  Have discussed risk factor modification. have discussed the need to quit smoking.  Family history.  Saw cardiology.  myoview unremarkable.  ECHO with preserved LV function.  Continue medical management. No changes. EKG as outlined.  Feel symptoms more related to the current infection and underlying pulmonary issues.  Follow    Aortic atherosclerosis (HCC) Assessment & Plan: Continue lipitor.    Other orders -     predniSONE; Take 6 tablets x 1 day and then decrease by 1/2 tablet per day until down to zero mg.  Dispense: 39 tablet; Refill: 0 -     Doxycycline Hyclate; Take 1 tablet (100 mg total) by mouth 2 (two) times daily.  Dispense: 20 tablet; Refill: 0 -     Albuterol Sulfate HFA; Inhale 2 puffs into the lungs every 6 (six) hours as needed for wheezing or shortness of breath.  Dispense: 18 g; Refill: 0     Dale Lowes, MD

## 2023-09-20 ENCOUNTER — Encounter: Payer: Self-pay | Admitting: Internal Medicine

## 2023-09-20 NOTE — Assessment & Plan Note (Signed)
Increased cough and congestion as outlined.  Appear to be c/w URI/bronchitis.  Given xopenex neb here in the office.  Tolerated. Pulse ox post neb 95% room air. Will treat with doxycycline and prednisone taper as directed.  Check cxr. Albuterol inhaler.  Follow.  Call with update tomorrow.  Declined ER evaluation. Any change or worsening symptoms, she will be evaluated.

## 2023-09-20 NOTE — Assessment & Plan Note (Signed)
Endocrinology - 03/2023 - recommended IV reclast.  Recommended f/u bone density 06/2024.

## 2023-09-20 NOTE — Assessment & Plan Note (Signed)
With increased cough and congestion as outlined. Treat infection as outlined. Check cxr.  Albuterol inhaler.  Follow.

## 2023-09-20 NOTE — Assessment & Plan Note (Signed)
Life line screening - 10/21/21 - mild carotid artery disease.  Continue statin and adequate blood pressure control.  Follow.  

## 2023-09-20 NOTE — Assessment & Plan Note (Signed)
On synthroid.  Follow tsh.   

## 2023-09-20 NOTE — Assessment & Plan Note (Signed)
Previous CT chest - calcification in aorta and Left main and 3 vessel disease.  Have discussed risk factor modification. have discussed the need to quit smoking.  Family history.  Saw cardiology.  myoview unremarkable.  ECHO with preserved LV function.  Continue medical management. No changes. EKG as outlined.  Feel symptoms more related to the current infection and underlying pulmonary issues.  Follow

## 2023-09-20 NOTE — Assessment & Plan Note (Signed)
Has had previous cardiac w/up as outlined in previous note.  With increased cough, congestion, feel sob related to the current infection and her history of smoking. EKG - SR with no acute ischemic changes. Treat current infection.  Check cxr. Discussed ER evaluation. She wants to hold on ER evaluation, stating unable to go at this time.  Follow closely.  Call with update.

## 2023-09-20 NOTE — Assessment & Plan Note (Signed)
Continue lipitor  ?

## 2023-09-20 NOTE — Assessment & Plan Note (Signed)
Continue lipitor.  Low cholesterol diet and exercise.  Follow lipid panel and liver function tests.   Lab Results  Component Value Date   CHOL 197 09/14/2023   HDL 72.70 09/14/2023   LDLCALC 103 (H) 09/14/2023   TRIG 105.0 09/14/2023   CHOLHDL 3 09/14/2023

## 2023-09-20 NOTE — Assessment & Plan Note (Signed)
Have discussed the need to quit.  Declines to quit at this time.  Follow.

## 2023-09-23 ENCOUNTER — Encounter: Payer: Self-pay | Admitting: Internal Medicine

## 2023-09-23 ENCOUNTER — Ambulatory Visit: Payer: Medicare Other | Admitting: Internal Medicine

## 2023-09-23 VITALS — BP 130/70 | HR 90 | Temp 98.0°F | Resp 16 | Ht 60.0 in | Wt 102.0 lb

## 2023-09-23 DIAGNOSIS — R059 Cough, unspecified: Secondary | ICD-10-CM

## 2023-09-23 DIAGNOSIS — I4949 Other premature depolarization: Secondary | ICD-10-CM | POA: Diagnosis not present

## 2023-09-23 DIAGNOSIS — E7800 Pure hypercholesterolemia, unspecified: Secondary | ICD-10-CM | POA: Diagnosis not present

## 2023-09-23 DIAGNOSIS — I2510 Atherosclerotic heart disease of native coronary artery without angina pectoris: Secondary | ICD-10-CM | POA: Diagnosis not present

## 2023-09-23 DIAGNOSIS — Z720 Tobacco use: Secondary | ICD-10-CM | POA: Diagnosis not present

## 2023-09-23 DIAGNOSIS — J432 Centrilobular emphysema: Secondary | ICD-10-CM | POA: Diagnosis not present

## 2023-09-23 DIAGNOSIS — M542 Cervicalgia: Secondary | ICD-10-CM | POA: Diagnosis not present

## 2023-09-23 NOTE — Assessment & Plan Note (Signed)
Was seen last week with increased cough, congestion and sob. Cough productive. CXR - no acute abnormality. Treated with doxycycline, prednisone taper and albuterol inhaler prescribed, but she has not been using.  She reports she is feeling better. Cough is better.  Breathing better. Still with some increased cough and congestion, but improved. She is tolerating the abx. Eating. Appetite is good. Continue prednisone and doxycycline as ordered.  Continue mucinex. She has not been using the inhaler. Discussed using - to help with symptoms.  Follow closely.  Call with update.

## 2023-09-23 NOTE — Assessment & Plan Note (Signed)
Continue lipitor.  Low cholesterol diet and exercise.  Follow lipid panel and liver function tests.   Lab Results  Component Value Date   CHOL 197 09/14/2023   HDL 72.70 09/14/2023   LDLCALC 103 (H) 09/14/2023   TRIG 105.0 09/14/2023   CHOLHDL 3 09/14/2023

## 2023-09-23 NOTE — Assessment & Plan Note (Signed)
Continue abx and prednisone.  Albuterol inhaler as directed.  Discussed the need to quit smoking.

## 2023-09-23 NOTE — Assessment & Plan Note (Signed)
Previous CT chest - calcification in aorta and Left main and 3 vessel disease.  Have discussed risk factor modification. have discussed the need to quit smoking.  Family history.  Saw cardiology.  myoview unremarkable.  ECHO with preserved LV function.  Continue medical management. No changes. EKG as outlined. Follow.

## 2023-09-23 NOTE — Assessment & Plan Note (Addendum)
Have discussed the need to quit.  Declines to quit at this time. Has decreased amount smoking.  Follow.

## 2023-09-23 NOTE — Assessment & Plan Note (Signed)
Seeing physiatry for her neck and back pain. She postponed her neck injection due to being sick.  Rescheduled.

## 2023-09-23 NOTE — Progress Notes (Signed)
Subjective:    Patient ID: Leah Benitez, female    DOB: 30-Apr-1951, 72 y.o.   MRN: [id number]  Patient here for scheduled follow up.   HPI Here for a scheduled follow up.  Was seen last week with increased cough, congestion and sob. Cough productive. CXR - no acute abnormality. Treated with doxycycline, prednisone taper and albuterol inhaler prescribed, but she has not been using.  She reports she is feeling better. Cough is better.  Breathing better. Still with some increased cough and congestion, but improved. She is tolerating the abx. Eating. Appetite is good. No nausea or vomiting. No diarrhea.    Past Medical History:  Diagnosis Date   Allergy    Hyperlipidemia    Thyroid disease    Past Surgical History:  Procedure Laterality Date   ABDOMINAL HYSTERECTOMY     previous abnormal pap smear   BREAST BIOPSY     TONSILLECTOMY     Family History  Problem Relation Age of Onset   Arthritis Mother    Stroke Mother    Hypertension Mother    Arthritis Father    Heart disease Father    Heart disease Brother    Cancer Maternal Aunt        breast   Social History   Socioeconomic History   Marital status: Married    Spouse name: Not on file   Number of children: Not on file   Years of education: Not on file   Highest education level: 12th grade  Occupational History   Not on file  Tobacco Use   Smoking status: Every Day    Current packs/day: 1.00    Average packs/day: 1 pack/day for 47.0 years (47.0 ttl pk-yrs)    Types: Cigarettes   Smokeless tobacco: Never  Substance and Sexual Activity   Alcohol use: Yes    Alcohol/week: 7.0 standard drinks of alcohol    Types: 7 Glasses of wine per week    Comment: 1 glass of wine per night   Drug use: Not on file   Sexual activity: Not on file  Other Topics Concern   Not on file  Social History Narrative   Not on file   Social Determinants of Health   Financial Resource Strain: Low Risk  (09/16/2023)   Overall Financial  Resource Strain (CARDIA)    Difficulty of Paying Living Expenses: Not hard at all  Food Insecurity: No Food Insecurity (09/16/2023)   Hunger Vital Sign    Worried About Running Out of Food in the Last Year: Never true    Ran Out of Food in the Last Year: Never true  Transportation Needs: No Transportation Needs (09/16/2023)   PRAPARE - Administrator, Civil Service (Medical): No    Lack of Transportation (Non-Medical): No  Physical Activity: Unknown (09/16/2023)   Exercise Vital Sign    Days of Exercise per Week: 0 days    Minutes of Exercise per Session: Not on file  Stress: Stress Concern Present (09/16/2023)   [employer] of Occupational Health - Occupational Stress Questionnaire    Feeling of Stress : To some extent  Social Connections: Socially Integrated (09/16/2023)   Social Connection and Isolation Panel [NHANES]    Frequency of Communication with Friends and Family: More than three times a week    Frequency of Social Gatherings with Friends and Family: Once a week    Attends Religious Services: More than 4 times per year    Active Member  of Clubs or Organizations: Yes    Attends Banker Meetings: Not on file    Marital Status: Married     Review of Systems  Constitutional:  Negative for appetite change, fever and unexpected weight change.  HENT:  Negative for sinus pressure.   Respiratory:  Positive for cough. Negative for chest tightness.        Cough and breathing better.   Cardiovascular:  Negative for chest pain, palpitations and leg swelling.  Gastrointestinal:  Negative for abdominal pain, diarrhea, nausea and vomiting.  Genitourinary:  Negative for difficulty urinating and dysuria.  Skin:  Negative for color change and rash.  Neurological:  Negative for dizziness and headaches.  Psychiatric/Behavioral:  Negative for agitation and dysphoric mood.        Objective:     BP 130/70   Pulse 90   Temp 98 F (36.7 C)   Resp 16   Ht 5'  (1.524 m)   Wt 102 lb (46.3 kg)   SpO2 96%   BMI 19.92 kg/m  Wt Readings from Last 3 Encounters:  09/23/23 102 lb (46.3 kg)  09/17/23 99 lb 3.2 oz (45 kg)  05/12/23 101 lb 12.8 oz (46.2 kg)    Physical Exam Vitals reviewed.  Constitutional:      General: She is not in acute distress.    Appearance: Normal appearance.  HENT:     Head: Normocephalic and atraumatic.     Right Ear: External ear normal.     Left Ear: External ear normal.     Mouth/Throat:     Pharynx: Oropharyngeal exudate present. No posterior oropharyngeal erythema.  Eyes:     General: No scleral icterus.       Right eye: No discharge.        Left eye: No discharge.     Conjunctiva/sclera: Conjunctivae normal.  Neck:     Thyroid: No thyromegaly.  Cardiovascular:     Rate and Rhythm: Normal rate.     Comments: Rhythm appears to be regular with frequent premature beats.  Pulmonary:     Effort: No respiratory distress.     Breath sounds: No wheezing.     Comments: No wheezing.  Increased air movement.  Abdominal:     General: Bowel sounds are normal.     Palpations: Abdomen is soft.     Tenderness: There is no abdominal tenderness.  Musculoskeletal:        General: No swelling or tenderness.     Cervical back: Neck supple. No tenderness.  Lymphadenopathy:     Cervical: No cervical adenopathy.  Skin:    Findings: No erythema or rash.  Neurological:     Mental Status: She is alert.  Psychiatric:        Mood and Affect: Mood normal.        Behavior: Behavior normal.      Outpatient Encounter Medications as of 09/23/2023  Medication Sig   albuterol (VENTOLIN HFA) 108 (90 Base) MCG/ACT inhaler Inhale 2 puffs into the lungs every 6 (six) hours as needed for wheezing or shortness of breath.   aspirin EC 81 MG tablet Take 1 tablet (81 mg total) by mouth daily. Swallow whole.   atorvastatin (LIPITOR) 20 MG tablet TAKE 1 TABLET BY MOUTH AT BEDTIME FOR CHOLESTEROL   cholecalciferol (VITAMIN D) 1000 units  tablet Take 2,000 Units by mouth daily.   Cyanocobalamin (VITAMIN B12) 1000 MCG TBCR    doxycycline (VIBRA-TABS) 100 MG tablet Take 1 tablet (  100 mg total) by mouth 2 (two) times daily.   Esomeprazole Magnesium (NEXIUM PO) Take by mouth.   fluticasone (FLONASE) 50 MCG/ACT nasal spray Place 2 sprays into both nostrils daily.   levothyroxine (SYNTHROID) 112 MCG tablet Take 1 tablet (112 mcg total) by mouth daily.   predniSONE (DELTASONE) 10 MG tablet Take 6 tablets x 1 day and then decrease by 1/2 tablet per day until down to zero mg.   sertraline (ZOLOFT) 25 MG tablet Take 1-2 tablets by mouth daily   verapamil (CALAN-SR) 180 MG CR tablet Take 180 mg by mouth daily.   No facility-administered encounter medications on file as of 09/23/2023.     Lab Results  Component Value Date   WBC 8.7 09/14/2023   HGB 13.5 09/14/2023   HCT 40.4 09/14/2023   PLT 290.0 09/14/2023   GLUCOSE 89 09/14/2023   CHOL 197 09/14/2023   TRIG 105.0 09/14/2023   HDL 72.70 09/14/2023   LDLCALC 103 (H) 09/14/2023   ALT 15 09/14/2023   AST 19 09/14/2023   NA 137 09/14/2023   K 4.3 09/14/2023   CL 100 09/14/2023   CREATININE 0.65 09/14/2023   BUN 9 09/14/2023   CO2 30 09/14/2023   TSH 0.38 05/07/2023    MR CERVICAL SPINE WO CONTRAST  Result Date: 08/20/2023 CLINICAL DATA:  73 year old female with spondylosis, left upper extremity symptoms, worsening pain. EXAM: MRI CERVICAL SPINE WITHOUT CONTRAST TECHNIQUE: Multiplanar, multisequence MR imaging of the cervical spine was performed. No intravenous contrast was administered. COMPARISON:  Cervical radiographs 03/25/2023. FINDINGS: Alignment: Stable. Straightening and mild reversal of lordosis as seen in [place]. Associated subtle degenerative appearing anterolisthesis C4 on C5. Vertebrae: Visualized bone marrow signal is within normal limits. No marrow edema or evidence of acute osseous abnormality. Cord: No spinal cord signal abnormality. Borderline to mild  degenerative lower cord mass effect, see details below. Posterior Fossa, vertebral arteries, paraspinal tissues: Negative cervicomedullary junction. Patchy T2 and STIR hyperintensity in the pons. Otherwise negative visible posterior fossa, brain parenchyma. Preserved major vascular flow voids in the neck. The right vertebral artery has a late entry into the cervical transverse foramen on series 109, image 17, and appears mildly dominant. Negative visible neck soft tissues, lung apices. Disc levels: C2-C3: Mild to moderate facet hypertrophy greater on the right. No stenosis. C3-C4: Small central disc protrusion (series 110, image 13). Mild facet hypertrophy. No significant spinal stenosis. Mild left C4 foraminal stenosis. C4-C5: Mild anterolisthesis. Moderate bilateral facet hypertrophy. Mild to moderate ligament flavum hypertrophy. Mild disc bulge. Mild spinal stenosis. No convincing cord mass effect. Moderate bilateral C5 foraminal stenosis. C5-C6: Disc space loss. Circumferential disc osteophyte complex with a broad-based posterior component (series 109, image 22). Mild facet hypertrophy. Mild spinal stenosis and mild ventral cord mass effect. Severe left, moderate right C7 foraminal stenosis. C6-C7: Similar disc space loss and circumferential disc osteophyte complex. Mild facet and ligament flavum hypertrophy. No significant spinal stenosis. Moderate to severe left greater than right C7 foraminal stenosis. C7-T1: Mild to moderate facet hypertrophy. No spinal stenosis. Borderline to mild right C8 foraminal stenosis. Capacious visible upper thoracic spinal canal. IMPRESSION: 1. Widespread cervical spine degeneration with mild degenerative anterolisthesis C4 on C5. No acute osseous abnormality. 2. Mild multifactorial spinal stenosis at C4-C5 through C6-C7. Mild cord mass effect at C5-C6. No spinal cord signal abnormality. 3. Associated moderate, up to severe degenerative neural foraminal stenosis at the bilateral C4  through C7 nerve levels. Electronically Signed   By: Odessa Fleming  M.D.   On: 08/20/2023 10:41       Assessment & Plan:  Premature beats Assessment & Plan: Noted on exam. She is without symptoms.  No increased heart rate or palpitations. EKG - SR with PACs.  Follow.  Treat infection.   Orders: -     EKG 12-Lead  Coronary artery disease involving native coronary artery of native heart without angina pectoris Assessment & Plan: Previous CT chest - calcification in aorta and Left main and 3 vessel disease.  Have discussed risk factor modification. have discussed the need to quit smoking.  Family history.  Saw cardiology.  myoview unremarkable.  ECHO with preserved LV function.  Continue medical management. No changes. EKG as outlined. Follow.    Cough, unspecified type Assessment & Plan: Was seen last week with increased cough, congestion and sob. Cough productive. CXR - no acute abnormality. Treated with doxycycline, prednisone taper and albuterol inhaler prescribed, but she has not been using.  She reports she is feeling better. Cough is better.  Breathing better. Still with some increased cough and congestion, but improved. She is tolerating the abx. Eating. Appetite is good. Continue prednisone and doxycycline as ordered.  Continue mucinex. She has not been using the inhaler. Discussed using - to help with symptoms.  Follow closely.  Call with update.    Centrilobular emphysema (HCC) Assessment & Plan: Continue abx and prednisone.  Albuterol inhaler as directed.  Discussed the need to quit smoking.     Hypercholesterolemia Assessment & Plan: Continue lipitor.  Low cholesterol diet and exercise.  Follow lipid panel and liver function tests.   Lab Results  Component Value Date   CHOL 197 09/14/2023   HDL 72.70 09/14/2023   LDLCALC 103 (H) 09/14/2023   TRIG 105.0 09/14/2023   CHOLHDL 3 09/14/2023      Neck pain Assessment & Plan: Seeing physiatry for her neck and back pain. She  postponed her neck injection due to being sick.  Rescheduled.     Tobacco abuse Assessment & Plan: Have discussed the need to quit.  Declines to quit at this time. Has decreased amount smoking.  Follow.       Dale [place], MD

## 2023-09-23 NOTE — Assessment & Plan Note (Signed)
Noted on exam. She is without symptoms.  No increased heart rate or palpitations. EKG - SR with PACs.  Follow.  Treat infection.

## 2023-10-02 ENCOUNTER — Encounter: Payer: Self-pay | Admitting: Internal Medicine

## 2023-10-02 NOTE — Telephone Encounter (Signed)
LMTCB

## 2023-10-05 NOTE — Telephone Encounter (Signed)
Patient is returning a call from [place]. Patient would like a call back.

## 2023-10-05 NOTE — Telephone Encounter (Signed)
Called patient. Unable to leave message.

## 2023-10-06 NOTE — Telephone Encounter (Signed)
Spoke with patient. Feeling some better. Offered appt today patient declined. She has been scheduled for Thursday and advised to be seen asap if symptoms worsen or new symptoms develop. Pt gave verbal understanding.

## 2023-10-08 ENCOUNTER — Ambulatory Visit (INDEPENDENT_AMBULATORY_CARE_PROVIDER_SITE_OTHER): Payer: Medicare Other | Admitting: Nurse Practitioner

## 2023-10-08 VITALS — BP 122/78 | HR 83 | Temp 97.9°F | Ht 60.0 in | Wt 99.6 lb

## 2023-10-08 DIAGNOSIS — J069 Acute upper respiratory infection, unspecified: Secondary | ICD-10-CM | POA: Diagnosis not present

## 2023-10-08 DIAGNOSIS — R0600 Dyspnea, unspecified: Secondary | ICD-10-CM

## 2023-10-08 MED ORDER — PREDNISONE 10 MG PO TABS: ORAL_TABLET | ORAL | 0 refills | Status: DC

## 2023-10-08 MED ORDER — AZITHROMYCIN 250 MG PO TABS: ORAL_TABLET | ORAL | 0 refills | Status: AC

## 2023-10-08 NOTE — Progress Notes (Signed)
Established Patient Office Visit  Subjective:  Patient ID: Leah Benitez, female    DOB: [birth year]  Age: 72 y.o. MRN: [id number]  CC:  Chief Complaint  Patient presents with   Cough    Cough & congestion   Discussed the use of a AI scribe  software for clinical note transcription with the patient, who gave verbal consent to proceed.  HPI  Leah Benitez presented with a worsening of symptoms after an initial improvement from a recent course of doxycycline and prednisone. The patient reported feeling better on the Tuesday of the previous week, but by Wednesday, they experienced a return of symptoms including fatigue, cough, and nasal congestion. The patient also reported some shortness of breath, but not as severe as their initial presentation.  Despite using an albuterol inhaler twice daily, the patient did not perceive significant relief. The patient denied fever and did not report any body aches beyond their usual neck pain.  The patient has been using over-the-counter medications including Mucinex DM Maximum Strength, Flonase nasal spray, saline solution, and occasionally Zyrtec. They also reported a postnasal drip sensation and a feeling of congestion in the throat.  The patient is a smoker, consuming approximately eight to nine cigarettes a day, and reported more coughing in the morning after not smoking all night. They also reported shortness of breath with exertion, such as when changing bed linens or doing laundry.  The patient's spouse recently developed a cold, but the patient denied any sore throat or throat pain, attributing the discomfort to mucus congestion.  Reviewed last chest x-ray on 09/17/23 no acute cardiopulmonary disease.  HPI   Past Medical History:  Diagnosis Date   Allergy    Hyperlipidemia    Thyroid disease     Past Surgical History:  Procedure Laterality Date   ABDOMINAL HYSTERECTOMY     previous abnormal pap smear   BREAST BIOPSY     TONSILLECTOMY       Family History  Problem Relation Age of Onset   Arthritis Mother    Stroke Mother    Hypertension Mother    Arthritis Father    Heart disease Father    Heart disease Brother    Cancer Maternal Aunt        breast    Social History   Socioeconomic History   Marital status: Married    Spouse name: Not on file   Number of children: Not on file   Years of education: Not on file   Highest education level: 12th grade  Occupational History   Not on file  Tobacco Use   Smoking status: Every Day    Current packs/day: 1.00    Average packs/day: 1 pack/day for 47.0 years (47.0 ttl pk-yrs)    Types: Cigarettes   Smokeless tobacco: Never  Substance and Sexual Activity   Alcohol use: Yes    Alcohol/week: 7.0 standard drinks of alcohol    Types: 7 Glasses of wine per week    Comment: 1 glass of wine per night   Drug use: Not on file   Sexual activity: Not on file  Other Topics Concern   Not on file  Social History Narrative   Not on file   Social Drivers of Health   Financial Resource Strain: Low Risk  (10/08/2023)   Overall Financial Resource Strain (CARDIA)    Difficulty of Paying Living Expenses: Not hard at all  Food Insecurity: No Food Insecurity (10/08/2023)   Hunger Vital Sign  Worried About Programme researcher, broadcasting/film/video in the Last Year: Never true    Ran Out of Food in the Last Year: Never true  Transportation Needs: No Transportation Needs (10/08/2023)   PRAPARE - Administrator, Civil Service (Medical): No    Lack of Transportation (Non-Medical): No  Physical Activity: Insufficiently Active (10/08/2023)   Exercise Vital Sign    Days of Exercise per Week: 1 day    Minutes of Exercise per Session: 20 min  Stress: Stress Concern Present (09/16/2023)   [employer] of Occupational Health - Occupational Stress Questionnaire    Feeling of Stress : To some extent  Social Connections: Socially Integrated (10/08/2023)   Social Connection and Isolation  Panel [NHANES]    Frequency of Communication with Friends and Family: More than three times a week    Frequency of Social Gatherings with Friends and Family: Once a week    Attends Religious Services: More than 4 times per year    Active Member of [employer] or Organizations: Yes    Attends Banker Meetings: Patient declined    Marital Status: Married  Catering manager Violence: Not At Risk (11/06/2022)   Humiliation, Afraid, Rape, and Kick questionnaire    Fear of Current or Ex-Partner: No    Emotionally Abused: No    Physically Abused: No    Sexually Abused: No     Outpatient Medications Prior to Visit  Medication Sig Dispense Refill   albuterol (VENTOLIN HFA) 108 (90 Base) MCG/ACT inhaler Inhale 2 puffs into the lungs every 6 (six) hours as needed for wheezing or shortness of breath. 18 g 0   aspirin EC 81 MG tablet Take 1 tablet (81 mg total) by mouth daily. Swallow whole. 30 tablet 0   atorvastatin (LIPITOR) 20 MG tablet TAKE 1 TABLET BY MOUTH AT BEDTIME FOR CHOLESTEROL 90 tablet 3   cholecalciferol (VITAMIN D) 1000 units tablet Take 2,000 Units by mouth daily.     Cyanocobalamin (VITAMIN B12) 1000 MCG TBCR      Esomeprazole Magnesium (NEXIUM PO) Take by mouth.     fluticasone (FLONASE) 50 MCG/ACT nasal spray Place 2 sprays into both nostrils daily.     levothyroxine (SYNTHROID) 112 MCG tablet Take 1 tablet (112 mcg total) by mouth daily. 90 tablet 1   sertraline (ZOLOFT) 25 MG tablet Take 1-2 tablets by mouth daily 180 tablet 1   verapamil (CALAN-SR) 180 MG CR tablet Take 180 mg by mouth daily.     doxycycline (VIBRA-TABS) 100 MG tablet Take 1 tablet (100 mg total) by mouth 2 (two) times daily. 20 tablet 0   predniSONE (DELTASONE) 10 MG tablet Take 6 tablets x 1 day and then decrease by 1/2 tablet per day until down to zero mg. 39 tablet 0   No facility-administered medications prior to visit.    Allergies  Allergen Reactions   Actonel [Risedronate Sodium] Nausea And  Vomiting   Penicillins Swelling    ROS Review of Systems Negative unless indicated in HPI.    Objective:    Physical Exam Constitutional:      Appearance: Normal appearance.  HENT:     Right Ear: Tympanic membrane normal. Tympanic membrane is not erythematous.     Left Ear: Tympanic membrane normal. Tympanic membrane is not erythematous.     Nose:     Right Turbinates: Not enlarged.     Left Turbinates: Not enlarged.     Right Sinus: No maxillary sinus tenderness or frontal  sinus tenderness.     Left Sinus: No maxillary sinus tenderness or frontal sinus tenderness.     Mouth/Throat:     Mouth: Mucous membranes are moist.     Pharynx: No pharyngeal swelling, oropharyngeal exudate or posterior oropharyngeal erythema.     Tonsils: No tonsillar exudate.  Cardiovascular:     Rate and Rhythm: Normal rate and regular rhythm.  Pulmonary:     Effort: Pulmonary effort is normal.     Breath sounds: Normal breath sounds. No stridor. No wheezing.  Skin:    General: Skin is warm.  Neurological:     General: No focal deficit present.     Mental Status: She is alert and oriented to person, place, and time. Mental status is at baseline.  Psychiatric:        Mood and Affect: Mood normal.        Behavior: Behavior normal.        Thought Content: Thought content normal.        Judgment: Judgment normal.     BP 122/78   Pulse 83   Temp 97.9 F (36.6 C)   Ht 5' (1.524 m)   Wt 99 lb 9.6 oz (45.2 kg)   SpO2 95%   BMI 19.45 kg/m  Wt Readings from Last 3 Encounters:  10/08/23 99 lb 9.6 oz (45.2 kg)  09/23/23 102 lb (46.3 kg)  09/17/23 99 lb 3.2 oz (45 kg)     Health Maintenance  Topic Date Due   COVID-19 Vaccine (6 - 2024-25 season) 06/14/2023   Medicare Annual Wellness (AWV)  11/07/2023   Zoster Vaccines- Shingrix (1 of 2) 12/22/2023 (Originally 06/16/1970)   Lung Cancer Screening  02/27/2024   MAMMOGRAM  06/28/2025   DTaP/Tdap/Td (2 - Td or Tdap) 01/20/2027   Colonoscopy   10/21/2032   Pneumonia Vaccine 45+ Years old  Completed   INFLUENZA VACCINE  Completed   DEXA SCAN  Completed   Hepatitis C Screening  Completed   HPV VACCINES  Aged Out    There are no preventive care reminders to display for this patient.  Lab Results  Component Value Date   TSH 0.38 05/07/2023   Lab Results  Component Value Date   WBC 8.7 09/14/2023   HGB 13.5 09/14/2023   HCT 40.4 09/14/2023   MCV 100.2 (H) 09/14/2023   PLT 290.0 09/14/2023   Lab Results  Component Value Date   NA 137 09/14/2023   K 4.3 09/14/2023   CO2 30 09/14/2023   GLUCOSE 89 09/14/2023   BUN 9 09/14/2023   CREATININE 0.65 09/14/2023   BILITOT 0.6 09/14/2023   ALKPHOS 42 09/14/2023   AST 19 09/14/2023   ALT 15 09/14/2023   PROT 6.5 09/14/2023   ALBUMIN 4.2 09/14/2023   CALCIUM 9.0 09/14/2023   GFR 88.07 09/14/2023   Lab Results  Component Value Date   CHOL 197 09/14/2023   Lab Results  Component Value Date   HDL 72.70 09/14/2023   Lab Results  Component Value Date   LDLCALC 103 (H) 09/14/2023   Lab Results  Component Value Date   TRIG 105.0 09/14/2023   Lab Results  Component Value Date   CHOLHDL 3 09/14/2023   No results found for: "HGBA1C"    Assessment & Plan:  URI with cough and congestion Assessment & Plan: Pt is afebrile, non toxic appearing, without respiratoty distress.  Will treat with z-pack and prednisone tapering. Advised to continue take plain Mucinex and take OTC antihistamine  for PND. Incresae fluid intake and rest. Patient with at this point symptoms not improving    Dyspnea, unspecified type Assessment & Plan: Patient reports her symptom has been improved compared to last time but not completely resolved.  Patient reports that she is not using albuterol inhaler consistently.  Advised patient to use albuterol inhaler.  She will keep [place] posted regarding symptoms.  Red flag discussed.  Patient was provided clear instructions to go to ER or urgent care  if symptoms does not improve, red flag or new problem develops.  Patient verbalized understanding.    Other orders -     Azithromycin; Take 2 tablets on day 1, then 1 tablet daily on days 2 through 5  Dispense: 6 tablet; Refill: 0 -     predniSONE; Take 4 tablets ( total 40 mg) by mouth for 2 days; take 3 tablets ( total 30 mg) by mouth for 2 days; take 2 tablets ( total 20 mg) by mouth for 1 day; take 1 tablet ( total 10 mg) by mouth for 1 day.  Dispense: 17 tablet; Refill: 0    Follow-up: Return if symptoms worsen or fail to improve.   Kara Dies, NP

## 2023-10-08 NOTE — Patient Instructions (Addendum)
Will treat with azithromycin and prednisone tapering. Take albuterol inhaler every 4-6 hours as needed. If symptoms are not improving please let us know.

## 2023-10-11 ENCOUNTER — Telehealth: Payer: Self-pay | Admitting: Nurse Practitioner

## 2023-10-11 ENCOUNTER — Encounter: Payer: Self-pay | Admitting: Nurse Practitioner

## 2023-10-11 DIAGNOSIS — J069 Acute upper respiratory infection, unspecified: Secondary | ICD-10-CM | POA: Insufficient documentation

## 2023-10-11 NOTE — Assessment & Plan Note (Signed)
Pt is afebrile, non toxic appearing, without respiratoty distress.  Will treat with z-pack and prednisone tapering. Advised to continue take plain Mucinex and take OTC antihistamine for PND. Incresae fluid intake and rest. Patient with at this point symptoms not improving

## 2023-10-11 NOTE — Assessment & Plan Note (Addendum)
Patient reports her symptom has been improved compared to last time but not completely resolved.  Patient reports that she is not using albuterol inhaler consistently.  Advised patient to use albuterol inhaler.  She will keep [place] posted regarding symptoms.  Red flag discussed.  Patient was provided clear instructions to go to ER or urgent care if symptoms does not improve, red flag or new problem develops.  Patient verbalized understanding.

## 2023-10-12 NOTE — Telephone Encounter (Signed)
Noted  

## 2023-10-13 DIAGNOSIS — M5412 Radiculopathy, cervical region: Secondary | ICD-10-CM | POA: Diagnosis not present

## 2023-10-13 DIAGNOSIS — M4802 Spinal stenosis, cervical region: Secondary | ICD-10-CM | POA: Diagnosis not present

## 2023-10-22 ENCOUNTER — Other Ambulatory Visit: Payer: Self-pay | Admitting: Internal Medicine

## 2023-11-09 ENCOUNTER — Ambulatory Visit (INDEPENDENT_AMBULATORY_CARE_PROVIDER_SITE_OTHER): Payer: Medicare Other | Admitting: *Deleted

## 2023-11-09 VITALS — Ht 60.0 in | Wt 100.0 lb

## 2023-11-09 DIAGNOSIS — M5412 Radiculopathy, cervical region: Secondary | ICD-10-CM | POA: Diagnosis not present

## 2023-11-09 DIAGNOSIS — M47812 Spondylosis without myelopathy or radiculopathy, cervical region: Secondary | ICD-10-CM | POA: Diagnosis not present

## 2023-11-09 DIAGNOSIS — Z0000 Encounter for general adult medical examination without abnormal findings: Secondary | ICD-10-CM

## 2023-11-09 DIAGNOSIS — M4802 Spinal stenosis, cervical region: Secondary | ICD-10-CM | POA: Diagnosis not present

## 2023-11-09 NOTE — Progress Notes (Signed)
Subjective:   Leah Benitez is a 73 y.o. female who presents for Medicare Annual (Subsequent) preventive examination.  Visit Complete: Virtual I connected with  Leah Benitez on 11/09/23 by a audio enabled telemedicine application and verified that I am speaking with the correct person using two identifiers.This patient declined Interactive audio and Acupuncturist. Therefore the visit was completed with audio only.   Patient Location: Home  Provider Location: Office/Clinic  I discussed the limitations of evaluation and management by telemedicine. The patient expressed understanding and agreed to proceed.  Vital Signs: Because this visit was a virtual/telehealth visit, some criteria may be missing or patient reported. Any vitals not documented were not able to be obtained and vitals that have been documented are patient reported.   Cardiac Risk Factors include: advanced age (>12men, >45 women);smoking/ tobacco exposure;dyslipidemia;Other (see comment), Risk factor comments: CAD     Objective:    Today's Vitals   11/09/23 0835 11/09/23 0836  Weight: 100 lb (45.4 kg)   Height: 5' (1.524 m)   PainSc:  5    Body mass index is 19.53 kg/m.     11/09/2023    8:52 AM 11/06/2022    8:52 AM 10/25/2021   12:38 PM 10/16/2020   11:15 AM 02/02/2019    9:50 AM  Advanced Directives  Does Patient Have a Medical Advance Directive? No No No No No  Would patient like information on creating a medical advance directive? No - Patient declined No - Patient declined No - Patient declined No - Patient declined Yes (MAU/Ambulatory/Procedural Areas - Information given)    Current Medications (verified) Outpatient Encounter Medications as of 11/09/2023  Medication Sig   albuterol (VENTOLIN HFA) 108 (90 Base) MCG/ACT inhaler Inhale 2 puffs into the lungs every 6 (six) hours as needed for wheezing or shortness of breath.   aspirin EC 81 MG tablet Take 1 tablet (81 mg total) by mouth daily.  Swallow whole.   atorvastatin (LIPITOR) 20 MG tablet TAKE ONE TABLET BY MOUTH AT BEDTIME FOR CHOLESTEROL   cholecalciferol (VITAMIN D) 1000 units tablet Take 2,000 Units by mouth daily.   Cyanocobalamin (VITAMIN B12) 1000 MCG TBCR    Esomeprazole Magnesium (NEXIUM PO) Take by mouth.   fluticasone (FLONASE) 50 MCG/ACT nasal spray Place 2 sprays into both nostrils daily.   ibuprofen (ADVIL) 200 MG tablet Take 200 mg by mouth every 6 (six) hours as needed.   levothyroxine (SYNTHROID) 112 MCG tablet Take 1 tablet (112 mcg total) by mouth daily.   sertraline (ZOLOFT) 25 MG tablet TAKE 1 OR 2 TABLETS BY MOUTH ONCE DAILY   verapamil (CALAN) 120 MG tablet Take 120 mg by mouth daily.   [DISCONTINUED] predniSONE (DELTASONE) 10 MG tablet Take 4 tablets ( total 40 mg) by mouth for 2 days; take 3 tablets ( total 30 mg) by mouth for 2 days; take 2 tablets ( total 20 mg) by mouth for 1 day; take 1 tablet ( total 10 mg) by mouth for 1 day. (Patient not taking: Reported on 11/09/2023)   [DISCONTINUED] verapamil (CALAN-SR) 180 MG CR tablet Take 180 mg by mouth daily. (Patient not taking: Reported on 11/09/2023)   No facility-administered encounter medications on file as of 11/09/2023.    Allergies (verified) Actonel [risedronate sodium] and Penicillins   History: Past Medical History:  Diagnosis Date   Allergy    Hyperlipidemia    Thyroid disease    Past Surgical History:  Procedure Laterality Date   ABDOMINAL  HYSTERECTOMY     previous abnormal pap smear   BREAST BIOPSY     TONSILLECTOMY     Family History  Problem Relation Age of Onset   Arthritis Mother    Stroke Mother    Hypertension Mother    Arthritis Father    Heart disease Father    Heart disease Brother    Cancer Maternal Aunt        breast   Social History   Socioeconomic History   Marital status: Married    Spouse name: Not on file   Number of children: Not on file   Years of education: Not on file   Highest education level:  12th grade  Occupational History   Not on file  Tobacco Use   Smoking status: Every Day    Current packs/day: 1.00    Average packs/day: 1 pack/day for 47.0 years (47.0 ttl pk-yrs)    Types: Cigarettes   Smokeless tobacco: Never  Substance and Sexual Activity   Alcohol use: Yes    Alcohol/week: 7.0 standard drinks of alcohol    Types: 7 Glasses of wine per week    Comment: 1 glass of wine per night   Drug use: Not on file   Sexual activity: Not on file  Other Topics Concern   Not on file  Social History Narrative   Married   Social Drivers of Health   Financial Resource Strain: Low Risk  (11/09/2023)   Overall Financial Resource Strain (CARDIA)    Difficulty of Paying Living Expenses: Not hard at all  Food Insecurity: No Food Insecurity (11/09/2023)   Hunger Vital Sign    Worried About Running Out of Food in the Last Year: Never true    Ran Out of Food in the Last Year: Never true  Transportation Needs: No Transportation Needs (11/09/2023)   PRAPARE - Administrator, Civil Service (Medical): No    Lack of Transportation (Non-Medical): No  Physical Activity: Inactive (11/09/2023)   Exercise Vital Sign    Days of Exercise per Week: 0 days    Minutes of Exercise per Session: 0 min  Stress: Stress Concern Present (11/09/2023)   [employer] of Occupational Health - Occupational Stress Questionnaire    Feeling of Stress : To some extent  Social Connections: Moderately Integrated (11/09/2023)   Social Connection and Isolation Panel [NHANES]    Frequency of Communication with Friends and Family: More than three times a week    Frequency of Social Gatherings with Friends and Family: Three times a week    Attends Religious Services: More than 4 times per year    Active Member of Clubs or Organizations: No    Attends Banker Meetings: Never    Marital Status: Married    Tobacco Counseling Ready to quit: No Counseling given: Not Answered   Clinical  Intake:  Pre-visit preparation completed: Yes  Pain : 0-10 Pain Score: 5  Pain Type: Chronic pain Pain Location: Neck Pain Descriptors / Indicators: Aching, Nagging Pain Onset: More than a month ago Pain Frequency: Constant     BMI - recorded: 19.53 Nutritional Status: BMI of 19-24  Normal Nutritional Risks: None Diabetes: No  How often do you need to have someone help you when you read instructions, pamphlets, or other written materials from your doctor or pharmacy?: 1 - Never  Interpreter Needed?: No  Information entered by :: R. Rakiya Krawczyk LPN   Activities of Daily Living    11/09/2023  8:38 AM  In your present state of health, do you have any difficulty performing the following activities:  Hearing? 0  Vision? 0  Comment readers  Difficulty concentrating or making decisions? 1  Walking or climbing stairs? 1  Comment climbing stairs  Dressing or bathing? 0  Doing errands, shopping? 0  Preparing Food and eating ? N  Using the Toilet? N  In the past six months, have you accidently leaked urine? Y  Comment wears panty liners  Do you have problems with loss of bowel control? N  Managing your Medications? N  Managing your Finances? N  Housekeeping or managing your Housekeeping? N    Patient Care Team: Dale Iroquois, MD as PCP - General (Internal Medicine) Melton Alar Rozell Searing, DO as Referring Physician (Cardiology)  Indicate any recent Medical Services you may have received from other than [facility] providers in the past year (date may be approximate).     Assessment:   This is a routine wellness examination for Darleen.  Hearing/Vision screen Hearing Screening - Comments:: No issues Vision Screening - Comments:: readers   Goals Addressed             This Visit's Progress    Patient Stated       Exercise more and quit smoking       Depression Screen    11/09/2023    8:47 AM 10/08/2023   11:46 AM 11/06/2022    8:55 AM 08/07/2022   10:39 AM 04/04/2022     9:34 AM 10/25/2021   12:35 PM 07/04/2021    1:26 PM  PHQ 2/9 Scores  PHQ - 2 Score 1 2 0 1 1 0 0  PHQ- 9 Score 6 4         Fall Risk    11/09/2023    8:41 AM 10/08/2023   11:46 AM 11/06/2022    8:56 AM 08/07/2022   10:39 AM 04/04/2022    9:33 AM  Fall Risk   Falls in the past year? 0 0 0 0 1  Number falls in past yr: 0 0 0 0 0  Injury with Fall? 0 0 0 0 1  Risk for fall due to : No Fall Risks No Fall Risks  No Fall Risks History of fall(s);Impaired balance/gait  Follow up Falls prevention discussed;Falls evaluation completed Falls evaluation completed Falls evaluation completed Falls evaluation completed Falls evaluation completed    MEDICARE RISK AT HOME: Medicare Risk at Home Any stairs in or around the home?: Yes If so, are there any without handrails?: No Home free of loose throw rugs in walkways, pet beds, electrical cords, etc?: Yes Adequate lighting in your home to reduce risk of falls?: Yes Life alert?: No Use of a cane, walker or w/c?: No Grab bars in the bathroom?: Yes Shower chair or bench in shower?: Yes Elevated toilet seat or a handicapped toilet?: No    Cognitive Function:        11/09/2023    8:53 AM 11/06/2022    8:55 AM 10/16/2020   11:22 AM 02/02/2019    9:55 AM  6CIT Screen  What Year? 0 points 0 points 0 points 0 points  What month? 0 points 0 points 0 points 0 points  What time? 0 points 0 points  0 points  Count back from 20 0 points 0 points  0 points  Months in reverse 0 points 0 points 0 points 0 points  Repeat phrase 0 points 0 points 0 points 0 points  Total Score 0 points 0 points  0 points    Immunizations Immunization History  Administered Date(s) Administered   Influenza, High Dose Seasonal PF 08/06/2022   Influenza, Seasonal, Injecte, Preservative Fre 08/28/2023   Influenza,inj,quad, With Preservative 07/27/2019   Influenza-Unspecified 07/30/2018, 07/24/2020, 07/02/2021   Moderna Sars-Covid-2 Vaccination 02/21/2020, 03/23/2020,  09/11/2020, 05/15/2021, 08/07/2021   Pneumococcal Conjugate-13 10/31/2016   Pneumococcal Polysaccharide-23 08/16/2019   Tdap 01/19/2017    TDAP status: Up to date  Flu Vaccine status: Up to date  Pneumococcal vaccine status: Up to date  Covid-19 vaccine status: Information provided on how to obtain vaccines.   Qualifies for Shingles Vaccine? Yes   Zostavax completed No  Shingrix Completed?: No.    Education has been provided regarding the importance of this vaccine. Patient has been advised to call insurance company to determine out of pocket expense if they have not yet received this vaccine. Advised may also receive vaccine at local pharmacy or Health Dept. Verbalized acceptance and understanding.  Screening Tests Health Maintenance  Topic Date Due   COVID-19 Vaccine (6 - 2024-25 season) 06/14/2023   Medicare Annual Wellness (AWV)  11/07/2023   Zoster Vaccines- Shingrix (1 of 2) 12/22/2023 (Originally 06/16/1970)   Lung Cancer Screening  02/27/2024   MAMMOGRAM  06/28/2025   DTaP/Tdap/Td (2 - Td or Tdap) 01/20/2027   Colonoscopy  10/21/2032   Pneumonia Vaccine 77+ Years old  Completed   INFLUENZA VACCINE  Completed   DEXA SCAN  Completed   Hepatitis C Screening  Completed   HPV VACCINES  Aged Out    Health Maintenance  Health Maintenance Due  Topic Date Due   COVID-19 Vaccine (6 - 2024-25 season) 06/14/2023   Medicare Annual Wellness (AWV)  11/07/2023    Colorectal cancer screening: Type of screening: Colonoscopy. Completed 10/2022. Repeat every 3 years  Mammogram status: Completed 06/2023. Repeat every year  Bone Density status: Completed 06/2022. Results reflect: Bone density results: OSTEOPOROSIS. Repeat every 2 years.  Lung Cancer Screening: (Low Dose CT Chest recommended if Age 59-80 years, 20 pack-year currently smoking OR have quit w/in 15years.) does qualify.     Additional Screening:  Hepatitis C Screening: does qualify; Completed 10/2020  Vision  Screening: Recommended annual ophthalmology exams for early detection of glaucoma and other disorders of the eye. Is the patient up to date with their annual eye exam?  Yes  Who is the provider or what is the name of the office in which the patient attends annual eye exams? [facility] If pt is not established with a provider, would they like to be referred to a provider to establish care? No .   Dental Screening: Recommended annual dental exams for proper oral hygiene    Community Resource Referral / Chronic Care Management: CRR required this visit?  No   CCM required this visit?  No     Plan:     I have personally reviewed and noted the following in the patient's chart:   Medical and social history Use of alcohol, tobacco or illicit drugs  Current medications and supplements including opioid prescriptions. Patient is not currently taking opioid prescriptions. Functional ability and status Nutritional status Physical activity Advanced directives List of other physicians Hospitalizations, surgeries, and ER visits in previous 12 months Vitals Screenings to include cognitive, depression, and falls Referrals and appointments  In addition, I have reviewed and discussed with patient certain preventive protocols, quality metrics, and best practice recommendations. A written personalized care plan for preventive services  as well as general preventive health recommendations were provided to patient.     Sydell Axon, LPN   [id number]   After Visit Summary: (MyChart) Due to this being a telephonic visit, the after visit summary with patients personalized plan was offered to patient via MyChart   Nurse Notes: None

## 2023-11-09 NOTE — Patient Instructions (Signed)
Leah Benitez , Thank you for taking time to come for your Medicare Wellness Visit. I appreciate your ongoing commitment to your health goals. Please review the following plan we discussed and let me know if I can assist you in the future.   Referrals/Orders/Follow-Ups/Clinician Recommendations: None  This is a list of the screening recommended for you and due dates:  Health Maintenance  Topic Date Due   COVID-19 Vaccine (6 - 2024-25 season) 06/14/2023   Zoster (Shingles) Vaccine (1 of 2) 12/22/2023*   Screening for Lung Cancer  02/27/2024   Medicare Annual Wellness Visit  11/08/2024   Mammogram  06/28/2025   Colon Cancer Screening  10/21/2025   DTaP/Tdap/Td vaccine (2 - Td or Tdap) 01/20/2027   Pneumonia Vaccine  Completed   Flu Shot  Completed   DEXA scan (bone density measurement)  Completed   Hepatitis C Screening  Completed   HPV Vaccine  Aged Out  *Topic was postponed. The date shown is not the original due date.    Advanced directives: (Declined) Advance directive discussed with you today. Even though you declined this today, please call our office should you change your mind, and we can give you the proper paperwork for you to fill out.  Next Medicare Annual Wellness Visit scheduled for next year: Yes 11/11/24 @ 8:50

## 2023-11-12 DIAGNOSIS — G44011 Episodic cluster headache, intractable: Secondary | ICD-10-CM | POA: Diagnosis not present

## 2023-11-12 DIAGNOSIS — M542 Cervicalgia: Secondary | ICD-10-CM | POA: Diagnosis not present

## 2023-11-20 DIAGNOSIS — M47812 Spondylosis without myelopathy or radiculopathy, cervical region: Secondary | ICD-10-CM | POA: Diagnosis not present

## 2023-11-27 DIAGNOSIS — M4802 Spinal stenosis, cervical region: Secondary | ICD-10-CM | POA: Diagnosis not present

## 2023-11-27 DIAGNOSIS — M47816 Spondylosis without myelopathy or radiculopathy, lumbar region: Secondary | ICD-10-CM | POA: Diagnosis not present

## 2023-11-27 DIAGNOSIS — M47812 Spondylosis without myelopathy or radiculopathy, cervical region: Secondary | ICD-10-CM | POA: Diagnosis not present

## 2023-11-27 DIAGNOSIS — M5412 Radiculopathy, cervical region: Secondary | ICD-10-CM | POA: Diagnosis not present

## 2023-12-23 ENCOUNTER — Other Ambulatory Visit: Payer: Self-pay | Admitting: Internal Medicine

## 2023-12-28 ENCOUNTER — Ambulatory Visit: Payer: Medicare Other | Admitting: Neurosurgery

## 2023-12-28 NOTE — Progress Notes (Unsigned)
 Referring Physician:  Dale Moravia, MD [address] [place],  [place] [address]  Primary Physician:  Leah Artesian, MD  History of Present Illness: 12/30/2023 [name] is here today with a chief complaint of cervicalgia.  She has had neck pain for well over a year.  She gets it aggravated by doing activities with her upper extremities and lifting items.  She has had injections as well as physical therapy.  She had physical therapy for both her neck and back.  She does get some numbness in her hands however this does not bother her this is only when she wakes up from sleeping.  She does not get at any other time.  She has not been previously diagnosed with carpal tunnel syndrome.  She is not having any bowel or bladder symptoms.  No numbness weakness or tingling.  No shooting pain down arms or shoulders.  Most of the pain is just in the neck and worsens with activity.  Conservative measures:  Physical therapy: has participated in the past year and has had cervical traction in [place], [place] at [facility]  Multimodal medical therapy including regular antiinflammatories: Ibuprofen, gabapentin, tramadol, meloxicam Injections:  11/19/2022: MBB to the left C4-5 and C5-6 facet joints (9/10 to 5/10) 10/13/2023: Left C4-5 transforaminal ESI (1 day of relief) 08/24/2023: Left C5 paraspinal musculature (little relief)   I have utilized the care everywhere function in epic to review the outside records available from external health systems.  Review of Systems:  A 10 point review of systems is negative, except for the pertinent positives and negatives detailed in the HPI.  Past Medical History: Past Medical History:  Diagnosis Date   Allergy    Hyperlipidemia    Thyroid disease     Past Surgical History: Past Surgical History:  Procedure Laterality Date   ABDOMINAL HYSTERECTOMY     previous abnormal pap smear   BREAST BIOPSY     TONSILLECTOMY       Allergies: Allergies as of 12/30/2023 - Review Complete 12/30/2023  Allergen Reaction Noted   Actonel [risedronate sodium] Nausea And Vomiting 08/21/2016   Penicillins Swelling 08/21/2016    Medications:  Current Outpatient Medications:    meloxicam (MOBIC) 15 MG tablet, Take 15 mg by mouth daily., Disp: , Rfl:    traMADol (ULTRAM) 50 MG tablet, Take 50 mg by mouth 2 (two) times daily., Disp: , Rfl:    albuterol (VENTOLIN HFA) 108 (90 Base) MCG/ACT inhaler, Inhale 2 puffs into the lungs every 6 (six) hours as needed for wheezing or shortness of breath., Disp: 18 g, Rfl: 0   aspirin EC 81 MG tablet, Take 1 tablet (81 mg total) by mouth daily. Swallow whole., Disp: 30 tablet, Rfl: 0   atorvastatin (LIPITOR) 20 MG tablet, TAKE ONE TABLET BY MOUTH AT BEDTIME FOR CHOLESTEROL, Disp: 90 tablet, Rfl: 3   cholecalciferol (VITAMIN D) 1000 units tablet, Take 2,000 Units by mouth daily., Disp: , Rfl:    Cyanocobalamin (VITAMIN B12) 1000 MCG TBCR, , Disp: , Rfl:    Esomeprazole Magnesium (NEXIUM PO), Take by mouth., Disp: , Rfl:    fluticasone (FLONASE) 50 MCG/ACT nasal spray, Place 2 sprays into both nostrils daily., Disp: , Rfl:    gabapentin (NEURONTIN) 100 MG capsule, Take 100 mg by mouth daily., Disp: , Rfl:    ibuprofen (ADVIL) 200 MG tablet, Take 200 mg by mouth every 6 (six) hours as needed., Disp: , Rfl:    levothyroxine (SYNTHROID) 112  MCG tablet, TAKE ONE TABLET BY MOUTH ONCE DAILY, Disp: 90 tablet, Rfl: 1   sertraline (ZOLOFT) 25 MG tablet, TAKE 1 OR 2 TABLETS BY MOUTH ONCE DAILY, Disp: 180 tablet, Rfl: 1   verapamil (CALAN) 120 MG tablet, Take 120 mg by mouth daily., Disp: , Rfl:   Social History: Social History   Tobacco Use   Smoking status: Every Day    Current packs/day: 1.00    Average packs/day: 1 pack/day for 47.0 years (47.0 ttl pk-yrs)    Types: Cigarettes   Smokeless tobacco: Never  Substance Use Topics   Alcohol use: Yes    Alcohol/week: 7.0 standard drinks of  alcohol    Types: 7 Glasses of wine per week    Comment: 1 glass of wine per night    Family Medical History: Family History  Problem Relation Age of Onset   Arthritis Mother    Stroke Mother    Hypertension Mother    Arthritis Father    Heart disease Father    Heart disease Brother    Cancer Maternal Aunt        breast    Physical Examination: Vitals:   12/30/23 1312  BP: 134/74    General: Patient is in no apparent distress. Attention to examination is appropriate.  Neck:   Supple.  Full range of motion.  Respiratory: Patient is breathing without any difficulty.   NEUROLOGICAL:     Awake, alert, oriented to person, place, and time.  Speech is clear and fluent.   Cranial Nerves: Pupils equal round and reactive to light.  Facial tone is symmetric.  Facial sensation is symmetric. Shoulder shrug is symmetric. Tongue protrusion is midline.    Strength: Side Biceps Triceps Deltoid Interossei Grip Wrist Ext. Wrist Flex.  R 5 5 5 5 5 5 5   L 5 5 5 5 5 5 5     Reflexes are 2+, no hyperreflexia noted  Bilateral upper and lower extremity sensation is intact to light touch, specifically no median distribution issues, no provocative factors for carpal tunnel syndrome     No evidence of dysmetria noted.  Gait is normal.    Imaging: Narrative & Impression  CLINICAL DATA:  73 year old female with spondylosis, left upper extremity symptoms, worsening pain.   EXAM: MRI CERVICAL SPINE WITHOUT CONTRAST   TECHNIQUE: Multiplanar, multisequence MR imaging of the cervical spine was performed. No intravenous contrast was administered.   COMPARISON:  Cervical radiographs 03/25/2023.   FINDINGS: Alignment: Stable. Straightening and mild reversal of lordosis as seen in Leah Benitez. Associated subtle degenerative appearing anterolisthesis C4 on C5.   Vertebrae: Visualized bone marrow signal is within normal limits. No marrow edema or evidence of acute osseous abnormality.   Cord:  No spinal cord signal abnormality. Borderline to mild degenerative lower cord mass effect, see details below.   Posterior Fossa, vertebral arteries, paraspinal tissues: Negative cervicomedullary junction. Patchy T2 and STIR hyperintensity in the pons. Otherwise negative visible posterior fossa, brain parenchyma.   Preserved major vascular flow voids in the neck. The right vertebral artery has a late entry into the cervical transverse foramen on series 109, image 17, and appears mildly dominant. Negative visible neck soft tissues, lung apices.   Disc levels:   C2-C3: Mild to moderate facet hypertrophy greater on the right. No stenosis.   C3-C4: Small central disc protrusion (series 110, image 13). Mild facet hypertrophy. No significant spinal stenosis. Mild left C4 foraminal stenosis.   C4-C5: Mild anterolisthesis. Moderate bilateral facet hypertrophy.  Mild to moderate ligament flavum hypertrophy. Mild disc bulge. Mild spinal stenosis. No convincing cord mass effect. Moderate bilateral C5 foraminal stenosis.   C5-C6: Disc space loss. Circumferential disc osteophyte complex with a broad-based posterior component (series 109, image 22). Mild facet hypertrophy. Mild spinal stenosis and mild ventral cord mass effect. Severe left, moderate right C7 foraminal stenosis.   C6-C7: Similar disc space loss and circumferential disc osteophyte complex. Mild facet and ligament flavum hypertrophy. No significant spinal stenosis. Moderate to severe left greater than right C7 foraminal stenosis.   C7-T1: Mild to moderate facet hypertrophy. No spinal stenosis. Borderline to mild right C8 foraminal stenosis.   Capacious visible upper thoracic spinal canal.   IMPRESSION: 1. Widespread cervical spine degeneration with mild degenerative anterolisthesis C4 on C5. No acute osseous abnormality. 2. Mild multifactorial spinal stenosis at C4-C5 through C6-C7. Mild cord mass effect at C5-C6. No spinal  cord signal abnormality. 3. Associated moderate, up to severe degenerative neural foraminal stenosis at the bilateral C4 through C7 nerve levels.     Electronically Signed   By: Odessa Fleming M.D.   Narrative & Impression  CLINICAL DATA:  Neck pain   EXAM: CERVICAL SPINE - 2-3 VIEW   COMPARISON:  None Available.   FINDINGS: No evidence of acute fracture or traumatic malalignment. Grade 1 anterolisthesis of C4 on C5. Multilevel spondylosis, disc space height loss, degenerative endplate change greatest at C5-C6 and C6-C7 where it is moderate. Moderate facet arthropathy in the upper cervical spine. Normal prevertebral soft tissues.   IMPRESSION: No acute abnormality.  Multilevel spondylosis greatest from C5-C7.     Electronically Signed   By: Minerva Fester M.D.   On: 04/01/2023 03:39   Narrative & Impression  EXAM: DUAL X-RAY ABSORPTIOMETRY (DXA) FOR BONE MINERAL DENSITY   IMPRESSION: Your patient Claudett Starkes completed a BMD test on 06/30/2022 using the [employer] iDXA DXA System (software version: 14.10) manufactured by [employer]. The following summarizes the results of our evaluation. Technologist::TNB PATIENT BIOGRAPHICAL: Name: Deisha, Stull Patient ID:  [id number]   Birth Date: 04-04-1951   Height:     60.0 in. Gender:      Female   Exam Date:  06/30/2022   Weight:     111.4 lbs. Indications: Caucasian, Family Hx of Osteoporosis, History of Fracture (Adult), Hypothyroid, Hysterectomy, Low Calcium Intake, Postmenopausal, Tobacco User Fractures: Foot, Ribs Treatments: Calcium, Synthroid, Vitamin D DENSITOMETRY RESULTS: Site      Region     Measured Date Measured Age WHO Classification Young Adult T-score BMD         %Change vs. Previous Significant Change (*) AP Spine L1-L4 06/30/2022 71.0 Osteopenia -2.2 0.924 g/cm2 -0.3% - AP Spine L1-L4 02/26/2017 65.6 Osteopenia -2.2 0.927 g/cm2 - -   DualFemur Neck Right 06/30/2022 71.0 Osteoporosis -2.5  0.690 g/cm2 -1.0% - DualFemur Neck Right 02/26/2017 65.6 Osteoporosis -2.5 0.697 g/cm2 - -   DualFemur Total Mean 06/30/2022 71.0 Osteopenia -2.1 0.745 g/cm2 -3.6% Yes DualFemur Total Mean 02/26/2017 65.6 Osteopenia -1.9 0.773 g/cm2 - - ASSESSMENT: The BMD measured at Femur Neck Right is 0.690 g/cm2 with a T-score of -2.5. This patient is considered osteoporotic according to World Health Organization [facility]) criteria. Compared with prior study, there has been no significant change in the spine. Compared with prior study, there has been no significant change in the right hip. Compared with prior study, there has been significant decrease in the total mean. Patient is not a candidate for FRAX due to  diagnosis of osteoporosis. The scan quality is good.   World Science writer [facility]) criteria for post-menopausal, Caucasian Women: Normal:                   T-score at or above -1 SD Osteopenia/low bone mass: T-score between -1 and -2.5 SD Osteoporosis:             T-score at or below -2.5 SD   RECOMMENDATIONS: 1. All patients should optimize calcium and vitamin D intake. 2. Consider FDA-approved medical therapies in postmenopausal women and men aged 36 years and older, based on the following: a. A hip or vertebral(clinical or morphometric) fracture b. T-score < -2.5 at the femoral neck or spine after appropriate evaluation to exclude secondary causes c. Low bone mass (T-score between -1.0 and -2.5 at the femoral neck or spine) and a 10-year probability of a hip fracture > 3% or a 10-year probability of a major osteoporosis-related fracture > 20% based on the [place]-adapted WHO algorithm 3. Clinician judgment and/or patient preferences may indicate treatment for people with 10-year fracture probabilities above or below these levels FOLLOW-UP: People with diagnosed cases of osteoporosis or at high risk for fracture should have regular bone mineral density tests. For patients eligible  for Medicare, routine testing is allowed once every 2 years. The testing frequency can be increased to one year for patients who have rapidly progressing disease, those who are receiving or discontinuing medical therapy to restore bone mass, or have additional risk factors.   I have reviewed this report, and agree with the above findings.   [facility]     Electronically Signed   By: Frederico Hamman M.D.   On: 06/30/2022 15:39   I have personally reviewed the images and agree with the above interpretation.  Medical Decision Making/Assessment and Plan: [name] is a pleasant 73 y.o. female with cervicalgia.  She has had this for over a year.  She is here today for evaluation.  She has had previous cervical physical therapy as well as low back physical therapy.  She got some benefit from this but continues to have pain.  She has a history of osteoporosis and is currently on Reclast.  Her x-rays demonstrate a spondylolisthesis above the level of what looks like a congenital fusion.  Her MRI verifies this, she does not have severe central stenosis, does have some mild to moderate central stenosis, she does however have multilevel cervical foraminal stenosis but no radicular pain.  Physical exam she has full strength no evidence of myelopathy.  She does smoke, I gave her recommendations to quit smoking especially if she needed spine fusion in the future.  She like to avoid surgery if at all possible.  I would like to evaluate her for any mobile spondylolisthesis as she does have a alignment issue noted both on her x-rays and on her MRI.  Thankfully this is not causing any neurologic pain or symptomatology and mostly focused on axial neck pain.  Thank you for involving me in the care of this patient.    Lovenia Kim MD/MSCR Neurosurgery

## 2023-12-30 ENCOUNTER — Ambulatory Visit (INDEPENDENT_AMBULATORY_CARE_PROVIDER_SITE_OTHER): Admitting: Neurosurgery

## 2023-12-30 ENCOUNTER — Encounter: Payer: Self-pay | Admitting: Neurosurgery

## 2023-12-30 ENCOUNTER — Ambulatory Visit
Admission: RE | Admit: 2023-12-30 | Discharge: 2023-12-30 | Disposition: A | Discharge status: Home / Self Care | Attending: Neurosurgery | Admitting: Neurosurgery

## 2023-12-30 ENCOUNTER — Ambulatory Visit
Admission: RE | Admit: 2023-12-30 | Discharge: 2023-12-30 | Disposition: A | Discharge status: Home / Self Care | Source: Ambulatory Visit | Attending: Neurosurgery | Admitting: Neurosurgery

## 2023-12-30 VITALS — BP 134/74 | Ht 60.0 in | Wt 100.0 lb

## 2023-12-30 DIAGNOSIS — M4312 Spondylolisthesis, cervical region: Secondary | ICD-10-CM | POA: Diagnosis not present

## 2023-12-30 DIAGNOSIS — M47812 Spondylosis without myelopathy or radiculopathy, cervical region: Secondary | ICD-10-CM | POA: Insufficient documentation

## 2023-12-30 DIAGNOSIS — M50322 Other cervical disc degeneration at C5-C6 level: Secondary | ICD-10-CM | POA: Diagnosis not present

## 2023-12-30 DIAGNOSIS — M4802 Spinal stenosis, cervical region: Secondary | ICD-10-CM

## 2023-12-31 DIAGNOSIS — I2510 Atherosclerotic heart disease of native coronary artery without angina pectoris: Secondary | ICD-10-CM | POA: Diagnosis not present

## 2023-12-31 DIAGNOSIS — I779 Disorder of arteries and arterioles, unspecified: Secondary | ICD-10-CM | POA: Diagnosis not present

## 2023-12-31 DIAGNOSIS — M542 Cervicalgia: Secondary | ICD-10-CM | POA: Diagnosis not present

## 2023-12-31 DIAGNOSIS — K2970 Gastritis, unspecified, without bleeding: Secondary | ICD-10-CM | POA: Diagnosis not present

## 2023-12-31 DIAGNOSIS — E782 Mixed hyperlipidemia: Secondary | ICD-10-CM | POA: Diagnosis not present

## 2023-12-31 DIAGNOSIS — Z720 Tobacco use: Secondary | ICD-10-CM | POA: Diagnosis not present

## 2023-12-31 DIAGNOSIS — J449 Chronic obstructive pulmonary disease, unspecified: Secondary | ICD-10-CM | POA: Diagnosis not present

## 2023-12-31 DIAGNOSIS — I499 Cardiac arrhythmia, unspecified: Secondary | ICD-10-CM | POA: Diagnosis not present

## 2023-12-31 DIAGNOSIS — R001 Bradycardia, unspecified: Secondary | ICD-10-CM | POA: Diagnosis not present

## 2024-01-07 ENCOUNTER — Encounter: Payer: Self-pay | Admitting: Neurosurgery

## 2024-01-13 DIAGNOSIS — I779 Disorder of arteries and arterioles, unspecified: Secondary | ICD-10-CM | POA: Diagnosis not present

## 2024-01-22 ENCOUNTER — Other Ambulatory Visit (INDEPENDENT_AMBULATORY_CARE_PROVIDER_SITE_OTHER): Payer: Medicare Other

## 2024-01-22 ENCOUNTER — Other Ambulatory Visit: Payer: Self-pay

## 2024-01-22 DIAGNOSIS — E7800 Pure hypercholesterolemia, unspecified: Secondary | ICD-10-CM

## 2024-01-22 LAB — HEPATIC FUNCTION PANEL
ALT: 12 U/L (ref 0–35)
AST: 18 U/L (ref 0–37)
Albumin: 4.5 g/dL (ref 3.5–5.2)
Alkaline Phosphatase: 45 U/L (ref 39–117)
Bilirubin, Direct: 0.1 mg/dL (ref 0.0–0.3)
Total Bilirubin: 0.5 mg/dL (ref 0.2–1.2)
Total Protein: 6.6 g/dL (ref 6.0–8.3)

## 2024-01-22 LAB — BASIC METABOLIC PANEL WITH GFR
BUN: 10 mg/dL (ref 6–23)
CO2: 31 meq/L (ref 19–32)
Calcium: 9.6 mg/dL (ref 8.4–10.5)
Chloride: 102 meq/L (ref 96–112)
Creatinine, Ser: 0.65 mg/dL (ref 0.40–1.20)
GFR: 87.85 mL/min (ref >60.00)
Glucose, Bld: 93 mg/dL (ref 70–99)
Potassium: 4.2 meq/L (ref 3.5–5.1)
Sodium: 140 meq/L (ref 135–145)

## 2024-01-22 LAB — LIPID PANEL
Cholesterol: 171 mg/dL (ref 0–200)
HDL: 71.8 mg/dL (ref >39.00)
LDL Cholesterol: 84 mg/dL (ref 0–99)
NonHDL: 99.27
Total CHOL/HDL Ratio: 2
Triglycerides: 77 mg/dL (ref 0.0–149.0)
VLDL: 15.4 mg/dL (ref 0.0–40.0)

## 2024-01-26 ENCOUNTER — Ambulatory Visit (INDEPENDENT_AMBULATORY_CARE_PROVIDER_SITE_OTHER): Payer: Medicare Other | Admitting: Internal Medicine

## 2024-01-26 VITALS — BP 134/76 | HR 69 | Temp 98.0°F | Resp 16 | Ht 60.0 in | Wt 102.8 lb

## 2024-01-26 DIAGNOSIS — M8580 Other specified disorders of bone density and structure, unspecified site: Secondary | ICD-10-CM

## 2024-01-26 DIAGNOSIS — E039 Hypothyroidism, unspecified: Secondary | ICD-10-CM | POA: Diagnosis not present

## 2024-01-26 DIAGNOSIS — F439 Reaction to severe stress, unspecified: Secondary | ICD-10-CM | POA: Diagnosis not present

## 2024-01-26 DIAGNOSIS — I2510 Atherosclerotic heart disease of native coronary artery without angina pectoris: Secondary | ICD-10-CM

## 2024-01-26 DIAGNOSIS — E7800 Pure hypercholesterolemia, unspecified: Secondary | ICD-10-CM | POA: Diagnosis not present

## 2024-01-26 DIAGNOSIS — I700 Atherosclerosis of aorta: Secondary | ICD-10-CM | POA: Diagnosis not present

## 2024-01-26 DIAGNOSIS — F419 Anxiety disorder, unspecified: Secondary | ICD-10-CM

## 2024-01-26 DIAGNOSIS — G44019 Episodic cluster headache, not intractable: Secondary | ICD-10-CM | POA: Diagnosis not present

## 2024-01-26 DIAGNOSIS — M542 Cervicalgia: Secondary | ICD-10-CM

## 2024-01-26 DIAGNOSIS — J432 Centrilobular emphysema: Secondary | ICD-10-CM

## 2024-01-26 DIAGNOSIS — I779 Disorder of arteries and arterioles, unspecified: Secondary | ICD-10-CM

## 2024-01-26 NOTE — Progress Notes (Signed)
 Subjective:    Patient ID: Leah Benitez, female    DOB: [birth year], 73 y.o.   MRN: [id number]  Patient here for  Chief Complaint  Patient presents with   Medical Management of Chronic Issues    HPI Here for a scheduled follow up - follow up regarding CAD, hypercholesterolemia and neck pain. Saw neurology 11/12/23 - chronic headaches - continue verapamil, high flow 100% oxygen prn. Seeing Dr Aleen Ammons - neck pain. S/p ESI. Takes meloxicam  prn. Saw [facility] 12/30/23. Evaluated by cardiology 12/31/23 - recommended continuing aspirin  and lipitor and carotid doppler. (Performed 01/13/24). Stays active. No chest pain reported. Breathing stable. Increased stress. Discussed. Has good support.    Past Medical History:  Diagnosis Date   Allergy    Hyperlipidemia    Thyroid  disease    Past Surgical History:  Procedure Laterality Date   ABDOMINAL HYSTERECTOMY     previous abnormal pap smear   BREAST BIOPSY     TONSILLECTOMY     Family History  Problem Relation Age of Onset   Arthritis Mother    Stroke Mother    Hypertension Mother    Arthritis Father    Heart disease Father    Heart disease Brother    Cancer Maternal Aunt        breast   Social History   Socioeconomic History   Marital status: Married    Spouse name: Not on file   Number of children: Not on file   Years of education: Not on file   Highest education level: 12th grade  Occupational History   Not on file  Tobacco Use   Smoking status: Every Day    Current packs/day: 1.00    Average packs/day: 1 pack/day for 47.0 years (47.0 ttl pk-yrs)    Types: Cigarettes   Smokeless tobacco: Never  Substance and Sexual Activity   Alcohol use: Yes    Alcohol/week: 7.0 standard drinks of alcohol    Types: 7 Glasses of wine per week    Comment: 1 glass of wine per night   Drug use: Not on file   Sexual activity: Not on file  Other Topics Concern   Not on file  Social History Narrative   Married   Social Drivers of Health    Financial Resource Strain: Low Risk  (11/09/2023)   Overall Financial Resource Strain (CARDIA)    Difficulty of Paying Living Expenses: Not hard at all  Food Insecurity: No Food Insecurity (11/09/2023)   Hunger Vital Sign    Worried About Running Out of Food in the Last Year: Never true    Ran Out of Food in the Last Year: Never true  Transportation Needs: No Transportation Needs (11/09/2023)   PRAPARE - Administrator, Civil Service (Medical): No    Lack of Transportation (Non-Medical): No  Physical Activity: Inactive (11/09/2023)   Exercise Vital Sign    Days of Exercise per Week: 0 days    Minutes of Exercise per Session: 0 min  Stress: Stress Concern Present (11/09/2023)   [employer] of Occupational Health - Occupational Stress Questionnaire    Feeling of Stress : To some extent  Social Connections: Moderately Integrated (11/09/2023)   Social Connection and Isolation Panel [NHANES]    Frequency of Communication with Friends and Family: More than three times a week    Frequency of Social Gatherings with Friends and Family: Three times a week    Attends Religious Services: More than 4 times per  year    Active Member of Clubs or Organizations: No    Attends Banker Meetings: Never    Marital Status: Married     Review of Systems  Constitutional:  Negative for appetite change and unexpected weight change.  HENT:  Negative for congestion and sinus pressure.   Respiratory:  Negative for cough, chest tightness and shortness of breath.   Cardiovascular:  Negative for chest pain, palpitations and leg swelling.  Gastrointestinal:  Negative for abdominal pain, diarrhea, nausea and vomiting.  Genitourinary:  Negative for difficulty urinating and dysuria.  Musculoskeletal:  Negative for joint swelling and myalgias.  Skin:  Negative for color change and rash.  Neurological:  Negative for dizziness.       No increased headaches.   Psychiatric/Behavioral:   Negative for agitation and dysphoric mood.        Objective:     BP 134/76   Pulse 69   Temp 98 F (36.7 C)   Resp 16   Ht 5' (1.524 m)   Wt 102 lb 12.8 oz (46.6 kg)   SpO2 97%   BMI 20.08 kg/m  Wt Readings from Last 3 Encounters:  01/26/24 102 lb 12.8 oz (46.6 kg)  12/30/23 100 lb (45.4 kg)  11/09/23 100 lb (45.4 kg)    Physical Exam Vitals reviewed.  Constitutional:      General: She is not in acute distress.    Appearance: Normal appearance.  HENT:     Head: Normocephalic and atraumatic.     Right Ear: External ear normal.     Left Ear: External ear normal.     Mouth/Throat:     Pharynx: No oropharyngeal exudate or posterior oropharyngeal erythema.  Eyes:     General: No scleral icterus.       Right eye: No discharge.        Left eye: No discharge.     Conjunctiva/sclera: Conjunctivae normal.  Neck:     Thyroid : No thyromegaly.  Cardiovascular:     Rate and Rhythm: Normal rate and regular rhythm.  Pulmonary:     Effort: No respiratory distress.     Breath sounds: Normal breath sounds. No wheezing.  Abdominal:     General: Bowel sounds are normal.     Palpations: Abdomen is soft.     Tenderness: There is no abdominal tenderness.  Musculoskeletal:        General: No swelling or tenderness.     Cervical back: Neck supple. No tenderness.  Lymphadenopathy:     Cervical: No cervical adenopathy.  Skin:    Findings: No erythema or rash.  Neurological:     Mental Status: She is alert.  Psychiatric:        Mood and Affect: Mood normal.        Behavior: Behavior normal.         Outpatient Encounter Medications as of 01/26/2024  Medication Sig   albuterol  (VENTOLIN  HFA) 108 (90 Base) MCG/ACT inhaler Inhale 2 puffs into the lungs every 6 (six) hours as needed for wheezing or shortness of breath.   aspirin  EC 81 MG tablet Take 1 tablet (81 mg total) by mouth daily. Swallow whole.   atorvastatin  (LIPITOR) 20 MG tablet TAKE ONE TABLET BY MOUTH AT BEDTIME FOR  CHOLESTEROL   cholecalciferol (VITAMIN D ) 1000 units tablet Take 2,000 Units by mouth daily.   Cyanocobalamin  (VITAMIN B12) 1000 MCG TBCR    Esomeprazole Magnesium (NEXIUM PO) Take by mouth.   fluticasone (FLONASE)  50 MCG/ACT nasal spray Place 2 sprays into both nostrils daily.   ibuprofen (ADVIL) 200 MG tablet Take 200 mg by mouth every 6 (six) hours as needed.   levothyroxine  (SYNTHROID ) 112 MCG tablet TAKE ONE TABLET BY MOUTH ONCE DAILY   meloxicam  (MOBIC ) 15 MG tablet Take 15 mg by mouth daily.   sertraline  (ZOLOFT ) 25 MG tablet TAKE 1 OR 2 TABLETS BY MOUTH ONCE DAILY   verapamil (CALAN) 120 MG tablet Take 120 mg by mouth daily.   [DISCONTINUED] gabapentin (NEURONTIN) 100 MG capsule Take 100 mg by mouth daily.   [DISCONTINUED] traMADol (ULTRAM) 50 MG tablet Take 50 mg by mouth 2 (two) times daily.   No facility-administered encounter medications on file as of 01/26/2024.     Lab Results  Component Value Date   WBC 8.7 09/14/2023   HGB 13.5 09/14/2023   HCT 40.4 09/14/2023   PLT 290.0 09/14/2023   GLUCOSE 93 01/22/2024   CHOL 171 01/22/2024   TRIG 77.0 01/22/2024   HDL 71.80 01/22/2024   LDLCALC 84 01/22/2024   ALT 12 01/22/2024   AST 18 01/22/2024   NA 140 01/22/2024   K 4.2 01/22/2024   CL 102 01/22/2024   CREATININE 0.65 01/22/2024   BUN 10 01/22/2024   CO2 31 01/22/2024   TSH 0.38 05/07/2023    DG Cervical Spine Complete Result Date: 01/06/2024 CLINICAL DATA:  Cervicalgia, evaluate for excess motion EXAM: CERVICAL SPINE - COMPLETE 4+ VIEW COMPARISON:  MRI cervical spine August 03, 2023 cervical spine series [place] 12, 2024 FINDINGS: Severe degenerative disc disease narrowing of the C5-C6 C6-C7 disc spaces with moderate anterior osteophytes and posterior bony spondylosis. 3 mm posterior listhesis of C5 on C4 on the neutral images that does not significantly change with flexion or extension images indicating no ligamentous instability C1-C2 articulation and predental space  are normal Prevertebral soft tissues are normal and spinolaminar line appears intact on neutral images IMPRESSION: *Severe degenerative disc disease C5-C6 and C6-C7. *3 mm posterior listhesis of C5 on C4 on the neutral images that does not significantly change with flexion or extension images indicating no ligamentous instability. Electronically Signed   By: Fredrich Jefferson M.D.   On: 01/06/2024 13:59       Assessment & Plan:  Anxiety Assessment & Plan: Continues on zoloft . Increased stress. Has good support. Will notify me if she feels she needs further intervention.    Hypercholesterolemia Assessment & Plan: Continue lipitor.  Low cholesterol diet and exercise.  Follow lipid panel and liver function tests.  Discussed recent labs.  Lab Results  Component Value Date   CHOL 171 01/22/2024   HDL 71.80 01/22/2024   LDLCALC 84 01/22/2024   TRIG 77.0 01/22/2024   CHOLHDL 2 01/22/2024     Orders: -     Hepatic function panel; Future -     Lipid panel; Future -     Basic metabolic panel with GFR; Future  Aortic atherosclerosis (HCC) Assessment & Plan: Continue lipitor.    Coronary artery disease involving native coronary artery of native heart without angina pectoris Assessment & Plan: Previous CT chest - calcification in aorta and Left main and 3 vessel disease.  Have discussed risk factor modification. Have discussed the need to quit smoking.  Family history.  Saw cardiology.  myoview unremarkable.  ECHO with preserved LV function.  Continue medical management. No changes. Recent cardiac evaluation - continue lipitor and aspirin .    Carotid artery disease, unspecified laterality, unspecified type (HCC)  Assessment & Plan: Life line screening - 10/21/21 - mild carotid artery disease.  Continue statin and adequate blood pressure control.  Just saw cardiology. Had carotid ultrasound 01/13/24. Obtain results.    Episodic cluster headache, not intractable Assessment & Plan: Episodic  cluster headaches.  Continue verapamil - 180mg .  Has sumatriptan.  Stable.    Centrilobular emphysema (HCC) Assessment & Plan: Have discussed the need to quit smoking.  Had albuterol  if needed. Feels breathing is stable.    Hypothyroidism, unspecified type Assessment & Plan: On synthroid . Follow tsh.    Neck pain Assessment & Plan: Seeing physiatry for her neck and back pain.   Seeing Dr Aleen Ammons - neck pain. S/p ESI. Takes meloxicam  prn. Follow.    Osteopenia, unspecified location Assessment & Plan: Endocrinology - 03/2023 - recommended IV reclast .  Recommended f/u bone density 06/2024.   Stress Assessment & Plan: Increased stress. Discussed. Continues on zoloft . Notify me if feels needs further intervention.       Dellar Fenton, MD

## 2024-01-31 ENCOUNTER — Encounter: Payer: Self-pay | Admitting: Internal Medicine

## 2024-01-31 NOTE — Assessment & Plan Note (Signed)
On synthroid.  Follow tsh.   

## 2024-01-31 NOTE — Assessment & Plan Note (Signed)
Episodic cluster headaches.  Continue verapamil - 180mg .  Has sumatriptan.  Stable.

## 2024-01-31 NOTE — Assessment & Plan Note (Signed)
 Previous CT chest - calcification in aorta and Left main and 3 vessel disease.  Have discussed risk factor modification. Have discussed the need to quit smoking.  Family history.  Saw cardiology.  myoview unremarkable.  ECHO with preserved LV function.  Continue medical management. No changes. Recent cardiac evaluation - continue lipitor and aspirin .

## 2024-01-31 NOTE — Assessment & Plan Note (Signed)
 Increased stress. Discussed. Continues on zoloft . Notify me if feels needs further intervention.

## 2024-01-31 NOTE — Assessment & Plan Note (Signed)
 Have discussed the need to quit smoking.  Had albuterol  if needed. Feels breathing is stable.

## 2024-01-31 NOTE — Assessment & Plan Note (Signed)
 Continue lipitor  ?

## 2024-01-31 NOTE — Assessment & Plan Note (Signed)
 Continues on zoloft . Increased stress. Has good support. Will notify me if she feels she needs further intervention.

## 2024-01-31 NOTE — Assessment & Plan Note (Signed)
 Life line screening - 10/21/21 - mild carotid artery disease.  Continue statin and adequate blood pressure control.  Just saw cardiology. Had carotid ultrasound 01/13/24. Obtain results.

## 2024-01-31 NOTE — Assessment & Plan Note (Signed)
Endocrinology - 03/2023 - recommended IV reclast.  Recommended f/u bone density 06/2024.

## 2024-01-31 NOTE — Assessment & Plan Note (Signed)
 Continue lipitor.  Low cholesterol diet and exercise.  Follow lipid panel and liver function tests.  Discussed recent labs.  Lab Results  Component Value Date   CHOL 171 01/22/2024   HDL 71.80 01/22/2024   LDLCALC 84 01/22/2024   TRIG 77.0 01/22/2024   CHOLHDL 2 01/22/2024

## 2024-01-31 NOTE — Assessment & Plan Note (Signed)
 Seeing physiatry for her neck and back pain.   Seeing Dr Aleen Ammons - neck pain. S/p ESI. Takes meloxicam  prn. Follow.

## 2024-02-16 DIAGNOSIS — H2513 Age-related nuclear cataract, bilateral: Secondary | ICD-10-CM | POA: Diagnosis not present

## 2024-03-16 ENCOUNTER — Other Ambulatory Visit: Payer: Self-pay | Admitting: Acute Care

## 2024-03-16 DIAGNOSIS — Z87891 Personal history of nicotine dependence: Secondary | ICD-10-CM

## 2024-03-16 DIAGNOSIS — Z122 Encounter for screening for malignant neoplasm of respiratory organs: Secondary | ICD-10-CM

## 2024-03-16 DIAGNOSIS — F17210 Nicotine dependence, cigarettes, uncomplicated: Secondary | ICD-10-CM

## 2024-03-31 ENCOUNTER — Ambulatory Visit
Admission: RE | Admit: 2024-03-31 | Discharge: 2024-03-31 | Disposition: A | Discharge status: Home / Self Care | Source: Ambulatory Visit | Attending: Acute Care | Admitting: Acute Care

## 2024-03-31 DIAGNOSIS — Z87891 Personal history of nicotine dependence: Secondary | ICD-10-CM | POA: Diagnosis not present

## 2024-03-31 DIAGNOSIS — F17210 Nicotine dependence, cigarettes, uncomplicated: Secondary | ICD-10-CM | POA: Insufficient documentation

## 2024-03-31 DIAGNOSIS — Z122 Encounter for screening for malignant neoplasm of respiratory organs: Secondary | ICD-10-CM | POA: Diagnosis not present

## 2024-03-31 DIAGNOSIS — M810 Age-related osteoporosis without current pathological fracture: Secondary | ICD-10-CM | POA: Diagnosis not present

## 2024-04-01 ENCOUNTER — Ambulatory Visit

## 2024-04-07 DIAGNOSIS — M810 Age-related osteoporosis without current pathological fracture: Secondary | ICD-10-CM | POA: Diagnosis not present

## 2024-04-07 DIAGNOSIS — F17200 Nicotine dependence, unspecified, uncomplicated: Secondary | ICD-10-CM | POA: Diagnosis not present

## 2024-04-13 ENCOUNTER — Other Ambulatory Visit: Payer: Self-pay | Admitting: Acute Care

## 2024-04-13 DIAGNOSIS — Z87891 Personal history of nicotine dependence: Secondary | ICD-10-CM

## 2024-04-13 DIAGNOSIS — Z122 Encounter for screening for malignant neoplasm of respiratory organs: Secondary | ICD-10-CM

## 2024-05-06 DIAGNOSIS — M810 Age-related osteoporosis without current pathological fracture: Secondary | ICD-10-CM | POA: Diagnosis not present

## 2024-05-25 ENCOUNTER — Other Ambulatory Visit

## 2024-05-25 DIAGNOSIS — E7800 Pure hypercholesterolemia, unspecified: Secondary | ICD-10-CM | POA: Diagnosis not present

## 2024-05-25 LAB — BASIC METABOLIC PANEL WITH GFR
BUN: 8 mg/dL (ref 6–23)
CO2: 29 meq/L (ref 19–32)
Calcium: 9.6 mg/dL (ref 8.4–10.5)
Chloride: 100 meq/L (ref 96–112)
Creatinine, Ser: 0.68 mg/dL (ref 0.40–1.20)
GFR: 86.69 mL/min (ref >60.00)
Glucose, Bld: 82 mg/dL (ref 70–99)
Potassium: 4.4 meq/L (ref 3.5–5.1)
Sodium: 138 meq/L (ref 135–145)

## 2024-05-25 LAB — HEPATIC FUNCTION PANEL
ALT: 14 U/L (ref 0–35)
AST: 18 U/L (ref 0–37)
Albumin: 4.5 g/dL (ref 3.5–5.2)
Alkaline Phosphatase: 44 U/L (ref 39–117)
Bilirubin, Direct: 0.1 mg/dL (ref 0.0–0.3)
Total Bilirubin: 0.5 mg/dL (ref 0.2–1.2)
Total Protein: 6.5 g/dL (ref 6.0–8.3)

## 2024-05-25 LAB — LIPID PANEL
Cholesterol: 184 mg/dL (ref 0–200)
HDL: 75.2 mg/dL (ref >39.00)
LDL Cholesterol: 90 mg/dL (ref 0–99)
NonHDL: 108.99
Total CHOL/HDL Ratio: 2
Triglycerides: 94 mg/dL (ref 0.0–149.0)
VLDL: 18.8 mg/dL (ref 0.0–40.0)

## 2024-05-26 ENCOUNTER — Ambulatory Visit: Payer: Self-pay | Admitting: Internal Medicine

## 2024-05-30 ENCOUNTER — Ambulatory Visit (INDEPENDENT_AMBULATORY_CARE_PROVIDER_SITE_OTHER): Admitting: Internal Medicine

## 2024-05-30 VITALS — BP 120/68 | HR 71 | Resp 16 | Ht 60.0 in | Wt 96.2 lb

## 2024-05-30 DIAGNOSIS — Z720 Tobacco use: Secondary | ICD-10-CM

## 2024-05-30 DIAGNOSIS — M542 Cervicalgia: Secondary | ICD-10-CM

## 2024-05-30 DIAGNOSIS — F439 Reaction to severe stress, unspecified: Secondary | ICD-10-CM

## 2024-05-30 DIAGNOSIS — I700 Atherosclerosis of aorta: Secondary | ICD-10-CM | POA: Diagnosis not present

## 2024-05-30 DIAGNOSIS — I779 Disorder of arteries and arterioles, unspecified: Secondary | ICD-10-CM | POA: Diagnosis not present

## 2024-05-30 DIAGNOSIS — I2510 Atherosclerotic heart disease of native coronary artery without angina pectoris: Secondary | ICD-10-CM | POA: Diagnosis not present

## 2024-05-30 DIAGNOSIS — J432 Centrilobular emphysema: Secondary | ICD-10-CM | POA: Diagnosis not present

## 2024-05-30 DIAGNOSIS — N281 Cyst of kidney, acquired: Secondary | ICD-10-CM | POA: Diagnosis not present

## 2024-05-30 DIAGNOSIS — M810 Age-related osteoporosis without current pathological fracture: Secondary | ICD-10-CM

## 2024-05-30 DIAGNOSIS — E039 Hypothyroidism, unspecified: Secondary | ICD-10-CM

## 2024-05-30 DIAGNOSIS — E7800 Pure hypercholesterolemia, unspecified: Secondary | ICD-10-CM | POA: Diagnosis not present

## 2024-05-30 MED ORDER — SERTRALINE HCL 25 MG PO TABS: ORAL_TABLET | ORAL | 1 refills | Status: DC

## 2024-05-30 MED ORDER — ALBUTEROL SULFATE HFA 108 (90 BASE) MCG/ACT IN AERS: 2.0000 | INHALATION_SPRAY | Freq: Four times a day (QID) | RESPIRATORY_TRACT | 1 refills | Status: AC | PRN

## 2024-05-30 MED ORDER — MELOXICAM 15 MG PO TABS: 15.0000 mg | ORAL_TABLET | Freq: Every day | ORAL | 1 refills | Status: DC

## 2024-05-30 MED ORDER — LEVOTHYROXINE SODIUM 112 MCG PO TABS: 112.0000 ug | ORAL_TABLET | Freq: Every day | ORAL | 1 refills | Status: DC

## 2024-05-30 NOTE — Progress Notes (Signed)
 Subjective:    Patient ID: Leah Benitez, female    DOB: August 01, 1951, 73 y.o.   MRN: [id number]  Patient here for  Chief Complaint  Patient presents with   Medical Management of Chronic Issues    HPI Here for a scheduled follow up - follow up regarding CAD, hypercholesterolemia and neck pain. Saw neurology 11/12/23 - chronic headaches - continue verapamil and recommended high flow 100% oxygen prn. Seeing Dr Dodson - neck pain. S/p ESI. Takes meloxicam  prn. Request refill on meloxicam . Does feel needs the medication discussed possible side effects and risk of the medication. Saw [facility] 12/30/23. Evaluated by cardiology 12/31/23 - recommended continuing aspirin  and lipitor and carotid doppler. (Performed 01/13/24). Continues on zoloft . Had f/u with endocrinology 04/07/24- recommended continuing reclast . Infusion 05/06/24. Due bone density 06/2024. Still smoking - 1/2 ppd. Breathing overall stable. Increased stress.    Past Medical History:  Diagnosis Date   Allergy    Hyperlipidemia    Thyroid  disease    Past Surgical History:  Procedure Laterality Date   ABDOMINAL HYSTERECTOMY     previous abnormal pap smear   BREAST BIOPSY     TONSILLECTOMY     Family History  Problem Relation Age of Onset   Arthritis Mother    Stroke Mother    Hypertension Mother    Arthritis Father    Heart disease Father    Heart disease Brother    Cancer Maternal Aunt        breast   Social History   Socioeconomic History   Marital status: Married    Spouse name: Not on file   Number of children: Not on file   Years of education: Not on file   Highest education level: 12th grade  Occupational History   Not on file  Tobacco Use   Smoking status: Every Day    Current packs/day: 1.00    Average packs/day: 1 pack/day for 47.0 years (47.0 ttl pk-yrs)    Types: Cigarettes   Smokeless tobacco: Never  Substance and Sexual Activity   Alcohol use: Yes    Alcohol/week: 7.0 standard drinks of alcohol    Types:  7 Glasses of wine per week    Comment: 1 glass of wine per night   Drug use: Not on file   Sexual activity: Not on file  Other Topics Concern   Not on file  Social History Narrative   Married   Social Drivers of Health   Financial Resource Strain: Low Risk  (05/30/2024)   Overall Financial Resource Strain (CARDIA)    Difficulty of Paying Living Expenses: Not hard at all  Food Insecurity: No Food Insecurity (05/30/2024)   Hunger Vital Sign    Worried About Running Out of Food in the Last Year: Never true    Ran Out of Food in the Last Year: Never true  Transportation Needs: No Transportation Needs (05/30/2024)   PRAPARE - Administrator, Civil Service (Medical): No    Lack of Transportation (Non-Medical): No  Physical Activity: Insufficiently Active (05/30/2024)   Exercise Vital Sign    Days of Exercise per Week: 1 day    Minutes of Exercise per Session: 20 min  Stress: Stress Concern Present (05/30/2024)   [employer] of Occupational Health - Occupational Stress Questionnaire    Feeling of Stress: To some extent  Social Connections: Socially Integrated (05/30/2024)   Social Connection and Isolation Panel    Frequency of Communication with Friends and Family:  More than three times a week    Frequency of Social Gatherings with Friends and Family: Twice a week    Attends Religious Services: More than 4 times per year    Active Member of [employer] or Organizations: Yes    Attends Banker Meetings: 1 to 4 times per year    Marital Status: Married     Review of Systems  Constitutional:  Negative for appetite change and unexpected weight change.  HENT:  Negative for congestion and sinus pressure.   Respiratory:  Negative for cough, chest tightness and shortness of breath.   Cardiovascular:  Negative for chest pain, palpitations and leg swelling.  Gastrointestinal:  Negative for abdominal pain, diarrhea, nausea and vomiting.  Genitourinary:  Negative for  difficulty urinating and dysuria.  Musculoskeletal:  Negative for joint swelling and myalgias.  Skin:  Negative for color change and rash.  Neurological:  Negative for dizziness and headaches.  Psychiatric/Behavioral:  Negative for agitation and dysphoric mood.        Increased stress.        Objective:     BP 120/68   Pulse 71   Resp 16   Ht 5' (1.524 m)   Wt 96 lb 3.2 oz (43.6 kg)   SpO2 96%   BMI 18.79 kg/m  Wt Readings from Last 3 Encounters:  05/30/24 96 lb 3.2 oz (43.6 kg)  01/26/24 102 lb 12.8 oz (46.6 kg)  12/30/23 100 lb (45.4 kg)    Physical Exam Vitals reviewed.  Constitutional:      General: She is not in acute distress.    Appearance: Normal appearance.  HENT:     Head: Normocephalic and atraumatic.     Right Ear: External ear normal.     Left Ear: External ear normal.     Mouth/Throat:     Pharynx: No oropharyngeal exudate or posterior oropharyngeal erythema.  Eyes:     General: No scleral icterus.       Right eye: No discharge.        Left eye: No discharge.     Conjunctiva/sclera: Conjunctivae normal.  Neck:     Thyroid : No thyromegaly.  Cardiovascular:     Rate and Rhythm: Normal rate and regular rhythm.  Pulmonary:     Effort: No respiratory distress.     Breath sounds: Normal breath sounds. No wheezing.  Abdominal:     General: Bowel sounds are normal.     Palpations: Abdomen is soft.     Tenderness: There is no abdominal tenderness.  Musculoskeletal:        General: No swelling or tenderness.     Cervical back: Neck supple. No tenderness.  Lymphadenopathy:     Cervical: No cervical adenopathy.  Skin:    Findings: No erythema or rash.  Neurological:     Mental Status: She is alert.  Psychiatric:        Mood and Affect: Mood normal.        Behavior: Behavior normal.         Outpatient Encounter Medications as of 05/30/2024  Medication Sig   albuterol  (VENTOLIN  HFA) 108 (90 Base) MCG/ACT inhaler Inhale 2 puffs into the lungs  every 6 (six) hours as needed for wheezing or shortness of breath.   aspirin  EC 81 MG tablet Take 1 tablet (81 mg total) by mouth daily. Swallow whole.   atorvastatin  (LIPITOR) 20 MG tablet TAKE ONE TABLET BY MOUTH AT BEDTIME FOR CHOLESTEROL   cholecalciferol (VITAMIN  D) 1000 units tablet Take 2,000 Units by mouth daily.   Cyanocobalamin  (VITAMIN B12) 1000 MCG TBCR    Esomeprazole Magnesium (NEXIUM PO) Take by mouth.   fluticasone (FLONASE) 50 MCG/ACT nasal spray Place 2 sprays into both nostrils daily.   ibuprofen (ADVIL) 200 MG tablet Take 200 mg by mouth every 6 (six) hours as needed.   levothyroxine  (SYNTHROID ) 112 MCG tablet Take 1 tablet (112 mcg total) by mouth daily.   meloxicam  (MOBIC ) 15 MG tablet Take 1 tablet (15 mg total) by mouth daily.   sertraline  (ZOLOFT ) 25 MG tablet TAKE 1 OR 2 TABLETS BY MOUTH ONCE DAILY   verapamil (CALAN) 120 MG tablet Take 120 mg by mouth daily.   [DISCONTINUED] albuterol  (VENTOLIN  HFA) 108 (90 Base) MCG/ACT inhaler Inhale 2 puffs into the lungs every 6 (six) hours as needed for wheezing or shortness of breath.   [DISCONTINUED] levothyroxine  (SYNTHROID ) 112 MCG tablet TAKE ONE TABLET BY MOUTH ONCE DAILY   [DISCONTINUED] meloxicam  (MOBIC ) 15 MG tablet Take 15 mg by mouth daily.   [DISCONTINUED] sertraline  (ZOLOFT ) 25 MG tablet TAKE 1 OR 2 TABLETS BY MOUTH ONCE DAILY   No facility-administered encounter medications on file as of 05/30/2024.     Lab Results  Component Value Date   WBC 8.7 09/14/2023   HGB 13.5 09/14/2023   HCT 40.4 09/14/2023   PLT 290.0 09/14/2023   GLUCOSE 82 05/25/2024   CHOL 184 05/25/2024   TRIG 94.0 05/25/2024   HDL 75.20 05/25/2024   LDLCALC 90 05/25/2024   ALT 14 05/25/2024   AST 18 05/25/2024   NA 138 05/25/2024   K 4.4 05/25/2024   CL 100 05/25/2024   CREATININE 0.68 05/25/2024   BUN 8 05/25/2024   CO2 29 05/25/2024   TSH 0.38 05/07/2023    CT CHEST LUNG CA SCREEN LOW DOSE W/O CM Result Date: 04/13/2024 CLINICAL  DATA:  Lung cancer screening. Current smoker with 49+ pack-year history. EXAM: CT CHEST WITHOUT CONTRAST LOW-DOSE FOR LUNG CANCER SCREENING TECHNIQUE: Multidetector CT imaging of the chest was performed following the standard protocol without IV contrast. RADIATION DOSE REDUCTION: This exam was performed according to the departmental dose-optimization program which includes automated exposure control, adjustment of the mA and/or kV according to patient size and/or use of iterative reconstruction technique. COMPARISON:  Lung cancer screening CT 02/27/2023 FINDINGS: Cardiovascular: The heart is normal in size. There are coronary artery calcifications. Aortic atherosclerosis without aneurysm. No pericardial effusion. Mediastinum/Nodes: No enlarged mediastinal lymph nodes. No obvious bulky hilar adenopathy, hilar assessment limited in the absence of IV contrast. No esophageal wall thickening. Lungs/Pleura: Tiny pulmonary nodules, largest 3.8 mm, perifissural, are unchanged from prior exam. No new pulmonary nodules. Mild emphysema and diffuse bronchial wall thickening. No acute airspace disease. No pleural effusion. Upper Abdomen: No acute findings.  Abdominal aortic atherosclerosis. Musculoskeletal: There are no acute or suspicious osseous abnormalities. Unchanged L1 superior endplate compression fracture. The bones are subjectively under mineralized. IMPRESSION: 1. Lung-RADS 2, benign appearance or behavior. Continue annual screening with low-dose chest CT without contrast in 12 months. 2. Coronary artery calcifications. 3. Aortic Atherosclerosis (ICD10-I70.0) and Emphysema (ICD10-J43.9). Electronically Signed   By: Andrea Gasman M.D.   On: 04/13/2024 10:35       Assessment & Plan:  Osteoporosis without current pathological fracture, unspecified osteoporosis type Assessment & Plan: Endocrinology - 03/2024 - recommended continuing IV reclast .  Recommended f/u bone density 06/2024.  Orders: -     DG Bone  Density; Future -  VITAMIN D  25 Hydroxy (Vit-D Deficiency, Fractures); Future  Hypercholesterolemia Assessment & Plan: Continue lipitor.  Low cholesterol diet and exercise.  Follow lipid panel and liver function tests.  Discussed recent labs. No changes today.  Lab Results  Component Value Date   CHOL 184 05/25/2024   HDL 75.20 05/25/2024   LDLCALC 90 05/25/2024   TRIG 94.0 05/25/2024   CHOLHDL 2 05/25/2024     Orders: -     Hepatic function panel; Future -     Lipid panel; Future -     Basic metabolic panel with GFR; Future -     CBC with Differential/Platelet; Future -     Basic metabolic panel with GFR; Future  Hypothyroidism, unspecified type Assessment & Plan: On synthroid . Follow tsh.   Orders: -     TSH; Future  Tobacco abuse Assessment & Plan: Have discussed the need to quit.  Conitnues to smoke 1/2 ppd.    Stress Assessment & Plan: Increased stress. Continue zoloft . Follow. Notify me if feels needs any further intervention. Follow.    Renal cyst Assessment & Plan: Was evaluated (08/2021) - Dr Penne - recommended f/u in 2 years.  Overdue f/u. Discuss if agreeable for f/u.    Neck pain Assessment & Plan: Seeing physiatry for her neck and back pain.   Seeing Dr Dodson - neck pain. S/p ESI. Taking meloxicam . Request refill. Discussed possible side effects and risk of medication. Follow metabolic panel.    Centrilobular emphysema (HCC) Assessment & Plan: Have discussed the need to quit smoking.  Breathing stable. Albuterol  prn.    Carotid artery disease, unspecified laterality, unspecified type [facility]) Assessment & Plan: Life line screening - 10/21/21 - mild carotid artery disease.  Continue statin and adequate blood pressure control.  Just saw cardiology. Had carotid ultrasound 01/13/24.    Coronary artery disease involving native coronary artery of native heart without angina pectoris Assessment & Plan: Previous CT chest - calcification in aorta and  Left main and 3 vessel disease.  Have discussed risk factor modification. Have discussed the need to quit smoking.  Family history.  Saw cardiology.  myoview unremarkable.  ECHO with preserved LV function.  Continue medical management. No changes. Recent cardiac evaluation - continue lipitor and aspirin .    Aortic atherosclerosis (HCC) Assessment & Plan: Continue lipitor.    Other orders -     Levothyroxine  Sodium; Take 1 tablet (112 mcg total) by mouth daily.  Dispense: 90 tablet; Refill: 1 -     Sertraline  HCl; TAKE 1 OR 2 TABLETS BY MOUTH ONCE DAILY  Dispense: 180 tablet; Refill: 1 -     Albuterol  Sulfate HFA; Inhale 2 puffs into the lungs every 6 (six) hours as needed for wheezing or shortness of breath.  Dispense: 18 g; Refill: 1 -     Meloxicam ; Take 1 tablet (15 mg total) by mouth daily.  Dispense: 30 tablet; Refill: 1     Allena Hamilton, MD

## 2024-05-30 NOTE — Patient Instructions (Addendum)
 YOUR BONE DENISTY SCAN (dexa)  IS DUE, PLEASE CALL AND GET THIS SCHEDULED! [facility] - call [phone]  Schedule after 07/02/24   Take tylenol arthritis - 2 tablets twice a day

## 2024-06-04 ENCOUNTER — Encounter: Payer: Self-pay | Admitting: Internal Medicine

## 2024-06-04 NOTE — Assessment & Plan Note (Signed)
 Have discussed the need to quit smoking.  Breathing stable. Albuterol  prn.

## 2024-06-04 NOTE — Assessment & Plan Note (Signed)
 Was evaluated (08/2021) - Dr Penne - recommended f/u in 2 years.  Overdue f/u. Discuss if agreeable for f/u.

## 2024-06-04 NOTE — Assessment & Plan Note (Signed)
 Endocrinology - 03/2024 - recommended continuing IV reclast .  Recommended f/u bone density 06/2024.

## 2024-06-04 NOTE — Assessment & Plan Note (Signed)
 Life line screening - 10/21/21 - mild carotid artery disease.  Continue statin and adequate blood pressure control.  Just saw cardiology. Had carotid ultrasound 01/13/24.

## 2024-06-04 NOTE — Assessment & Plan Note (Signed)
 Continue lipitor.  Low cholesterol diet and exercise.  Follow lipid panel and liver function tests.  Discussed recent labs. No changes today.  Lab Results  Component Value Date   CHOL 184 05/25/2024   HDL 75.20 05/25/2024   LDLCALC 90 05/25/2024   TRIG 94.0 05/25/2024   CHOLHDL 2 05/25/2024

## 2024-06-04 NOTE — Assessment & Plan Note (Signed)
 Increased stress. Continue zoloft . Follow. Notify me if feels needs any further intervention. Follow.

## 2024-06-04 NOTE — Assessment & Plan Note (Signed)
 Have discussed the need to quit.  Conitnues to smoke 1/2 ppd.

## 2024-06-04 NOTE — Assessment & Plan Note (Signed)
On synthroid.  Follow tsh.   

## 2024-06-04 NOTE — Assessment & Plan Note (Signed)
 Seeing physiatry for her neck and back pain.   Seeing Dr Dodson - neck pain. S/p ESI. Taking meloxicam . Request refill. Discussed possible side effects and risk of medication. Follow metabolic panel.

## 2024-06-04 NOTE — Assessment & Plan Note (Signed)
 Continue lipitor  ?

## 2024-06-04 NOTE — Assessment & Plan Note (Signed)
 Previous CT chest - calcification in aorta and Left main and 3 vessel disease.  Have discussed risk factor modification. Have discussed the need to quit smoking.  Family history.  Saw cardiology.  myoview unremarkable.  ECHO with preserved LV function.  Continue medical management. No changes. Recent cardiac evaluation - continue lipitor and aspirin .

## 2024-07-01 ENCOUNTER — Other Ambulatory Visit (INDEPENDENT_AMBULATORY_CARE_PROVIDER_SITE_OTHER)

## 2024-07-01 DIAGNOSIS — E039 Hypothyroidism, unspecified: Secondary | ICD-10-CM

## 2024-07-01 DIAGNOSIS — M810 Age-related osteoporosis without current pathological fracture: Secondary | ICD-10-CM | POA: Diagnosis not present

## 2024-07-01 DIAGNOSIS — E7800 Pure hypercholesterolemia, unspecified: Secondary | ICD-10-CM

## 2024-07-01 LAB — CBC WITH DIFFERENTIAL/PLATELET
Basophils Absolute: 0.1 K/uL (ref 0.0–0.1)
Basophils Relative: 0.7 % (ref 0.0–3.0)
Eosinophils Absolute: 0.1 K/uL (ref 0.0–0.7)
Eosinophils Relative: 1.6 % (ref 0.0–5.0)
HCT: 39.2 % (ref 36.0–46.0)
Hemoglobin: 13.2 g/dL (ref 12.0–15.0)
Lymphocytes Relative: 30.6 % (ref 12.0–46.0)
Lymphs Abs: 2.6 K/uL (ref 0.7–4.0)
MCHC: 33.7 g/dL (ref 30.0–36.0)
MCV: 98.5 fl (ref 78.0–100.0)
Monocytes Absolute: 0.7 K/uL (ref 0.1–1.0)
Monocytes Relative: 8.3 % (ref 3.0–12.0)
Neutro Abs: 5 K/uL (ref 1.4–7.7)
Neutrophils Relative %: 58.8 % (ref 43.0–77.0)
Platelets: 239 K/uL (ref 150.0–400.0)
RBC: 3.97 Mil/uL (ref 3.87–5.11)
RDW: 13.1 % (ref 11.5–15.5)
WBC: 8.6 K/uL (ref 4.0–10.5)

## 2024-07-01 LAB — BASIC METABOLIC PANEL WITH GFR
BUN: 8 mg/dL (ref 6–23)
CO2: 31 meq/L (ref 19–32)
Calcium: 9.6 mg/dL (ref 8.4–10.5)
Chloride: 100 meq/L (ref 96–112)
Creatinine, Ser: 0.65 mg/dL (ref 0.40–1.20)
GFR: 87.58 mL/min (ref >60.00)
Glucose, Bld: 99 mg/dL (ref 70–99)
Potassium: 4 meq/L (ref 3.5–5.1)
Sodium: 138 meq/L (ref 135–145)

## 2024-07-01 LAB — LIPID PANEL
Cholesterol: 167 mg/dL (ref 0–200)
HDL: 72.1 mg/dL (ref >39.00)
LDL Cholesterol: 80 mg/dL (ref 0–99)
NonHDL: 94.78
Total CHOL/HDL Ratio: 2
Triglycerides: 75 mg/dL (ref 0.0–149.0)
VLDL: 15 mg/dL (ref 0.0–40.0)

## 2024-07-01 LAB — HEPATIC FUNCTION PANEL
ALT: 14 U/L (ref 0–35)
AST: 16 U/L (ref 0–37)
Albumin: 4.5 g/dL (ref 3.5–5.2)
Alkaline Phosphatase: 38 U/L — ABNORMAL LOW (ref 39–117)
Bilirubin, Direct: 0.1 mg/dL (ref 0.0–0.3)
Total Bilirubin: 0.6 mg/dL (ref 0.2–1.2)
Total Protein: 6.3 g/dL (ref 6.0–8.3)

## 2024-07-01 LAB — TSH: TSH: 0.22 u[IU]/mL — ABNORMAL LOW (ref 0.35–5.50)

## 2024-07-01 LAB — VITAMIN D 25 HYDROXY (VIT D DEFICIENCY, FRACTURES): VITD: 52.99 ng/mL (ref 30.00–100.00)

## 2024-07-04 ENCOUNTER — Other Ambulatory Visit: Payer: Self-pay

## 2024-07-04 ENCOUNTER — Ambulatory Visit: Payer: Self-pay | Admitting: Internal Medicine

## 2024-07-04 DIAGNOSIS — E039 Hypothyroidism, unspecified: Secondary | ICD-10-CM

## 2024-07-04 DIAGNOSIS — E7800 Pure hypercholesterolemia, unspecified: Secondary | ICD-10-CM

## 2024-07-04 MED ORDER — LEVOTHYROXINE SODIUM 100 MCG PO TABS: 100.0000 ug | ORAL_TABLET | Freq: Every day | ORAL | 0 refills | Status: DC

## 2024-07-14 ENCOUNTER — Other Ambulatory Visit: Payer: Self-pay | Admitting: Internal Medicine

## 2024-07-15 NOTE — Telephone Encounter (Signed)
 Rx ok'd for meloxicam .

## 2024-08-31 ENCOUNTER — Other Ambulatory Visit (INDEPENDENT_AMBULATORY_CARE_PROVIDER_SITE_OTHER)

## 2024-08-31 ENCOUNTER — Ambulatory Visit
Admission: RE | Admit: 2024-08-31 | Discharge: 2024-08-31 | Disposition: A | Discharge status: Home / Self Care | Source: Ambulatory Visit | Attending: Internal Medicine | Admitting: Internal Medicine

## 2024-08-31 DIAGNOSIS — E7800 Pure hypercholesterolemia, unspecified: Secondary | ICD-10-CM | POA: Diagnosis not present

## 2024-08-31 DIAGNOSIS — E039 Hypothyroidism, unspecified: Secondary | ICD-10-CM | POA: Diagnosis not present

## 2024-08-31 DIAGNOSIS — M810 Age-related osteoporosis without current pathological fracture: Secondary | ICD-10-CM | POA: Insufficient documentation

## 2024-08-31 LAB — HEPATIC FUNCTION PANEL
ALT: 16 U/L (ref 0–35)
AST: 19 U/L (ref 0–37)
Albumin: 4.7 g/dL (ref 3.5–5.2)
Alkaline Phosphatase: 44 U/L (ref 39–117)
Bilirubin, Direct: 0.1 mg/dL (ref 0.0–0.3)
Total Bilirubin: 0.5 mg/dL (ref 0.2–1.2)
Total Protein: 6.9 g/dL (ref 6.0–8.3)

## 2024-08-31 LAB — TSH: TSH: 0.78 u[IU]/mL (ref 0.35–5.50)

## 2024-08-31 LAB — BASIC METABOLIC PANEL WITH GFR
BUN: 7 mg/dL (ref 6–23)
CO2: 33 meq/L — ABNORMAL HIGH (ref 19–32)
Calcium: 9.5 mg/dL (ref 8.4–10.5)
Chloride: 100 meq/L (ref 96–112)
Creatinine, Ser: 0.62 mg/dL (ref 0.40–1.20)
GFR: 88.48 mL/min (ref >60.00)
Glucose, Bld: 88 mg/dL (ref 70–99)
Potassium: 4.8 meq/L (ref 3.5–5.1)
Sodium: 140 meq/L (ref 135–145)

## 2024-08-31 LAB — LIPID PANEL
Cholesterol: 180 mg/dL (ref 0–200)
HDL: 79.7 mg/dL (ref >39.00)
LDL Cholesterol: 81 mg/dL (ref 0–99)
NonHDL: 100.06
Total CHOL/HDL Ratio: 2
Triglycerides: 96 mg/dL (ref 0.0–149.0)
VLDL: 19.2 mg/dL (ref 0.0–40.0)

## 2024-09-01 ENCOUNTER — Ambulatory Visit: Payer: Self-pay | Admitting: Internal Medicine

## 2024-09-02 ENCOUNTER — Ambulatory Visit (INDEPENDENT_AMBULATORY_CARE_PROVIDER_SITE_OTHER): Admitting: Internal Medicine

## 2024-09-02 VITALS — BP 120/70 | HR 66 | Temp 98.5°F | Ht 60.0 in | Wt 95.0 lb

## 2024-09-02 DIAGNOSIS — N281 Cyst of kidney, acquired: Secondary | ICD-10-CM | POA: Diagnosis not present

## 2024-09-02 DIAGNOSIS — I2510 Atherosclerotic heart disease of native coronary artery without angina pectoris: Secondary | ICD-10-CM | POA: Diagnosis not present

## 2024-09-02 DIAGNOSIS — M810 Age-related osteoporosis without current pathological fracture: Secondary | ICD-10-CM | POA: Diagnosis not present

## 2024-09-02 DIAGNOSIS — Z720 Tobacco use: Secondary | ICD-10-CM | POA: Diagnosis not present

## 2024-09-02 DIAGNOSIS — Z1231 Encounter for screening mammogram for malignant neoplasm of breast: Secondary | ICD-10-CM | POA: Diagnosis not present

## 2024-09-02 DIAGNOSIS — E039 Hypothyroidism, unspecified: Secondary | ICD-10-CM | POA: Diagnosis not present

## 2024-09-02 DIAGNOSIS — M542 Cervicalgia: Secondary | ICD-10-CM

## 2024-09-02 DIAGNOSIS — E7800 Pure hypercholesterolemia, unspecified: Secondary | ICD-10-CM

## 2024-09-02 DIAGNOSIS — J439 Emphysema, unspecified: Secondary | ICD-10-CM | POA: Diagnosis not present

## 2024-09-02 DIAGNOSIS — F439 Reaction to severe stress, unspecified: Secondary | ICD-10-CM | POA: Diagnosis not present

## 2024-09-02 DIAGNOSIS — Z23 Encounter for immunization: Secondary | ICD-10-CM | POA: Diagnosis not present

## 2024-09-02 DIAGNOSIS — I779 Disorder of arteries and arterioles, unspecified: Secondary | ICD-10-CM

## 2024-09-02 MED ORDER — PREDNISONE 10 MG PO TABS: ORAL_TABLET | ORAL | 0 refills | Status: DC

## 2024-09-02 MED ORDER — LEVOTHYROXINE SODIUM 100 MCG PO TABS
100.0000 ug | ORAL_TABLET | Freq: Every day | ORAL | 1 refills | Status: AC
Start: 2024-09-02 — End: ?

## 2024-09-02 MED ORDER — ATORVASTATIN CALCIUM 20 MG PO TABS: ORAL_TABLET | ORAL | 3 refills | Status: AC

## 2024-09-02 MED ORDER — SERTRALINE HCL 25 MG PO TABS: ORAL_TABLET | ORAL | 1 refills | Status: AC

## 2024-09-02 NOTE — Progress Notes (Unsigned)
 Subjective:    Patient ID: Leah Benitez, female    DOB: 1950/12/27, 73 y.o.   MRN: [id number]  Patient here for  Chief Complaint  Patient presents with   Medical Management of Chronic Issues    HPI With past history of hypercholesterolemia, CAD, neck pain and chronic headaches, comes in today to follow up on these issues. Given acute issues, held on physcal today. Saw neurology 11/12/23 - chronic headaches - continue verapamil and recommended high flow 100% oxygen prn. Seeing Dr Dodson - neck pain. S/p ESI. Takes meloxicam  prn. Saw [facility] 12/30/23. Evaluated by cardiology 12/31/23 - recommended continuing aspirin  and lipitor and carotid doppler. (Performed 01/13/24). Carotid ultrasound - no significant stenosis. Continues on zoloft . Had f/u with endocrinology 04/07/24- recommended continuing reclast . Infusion 05/06/24.  Had recent bone density. Discussed results - reported no significant change. Forward copy to endocrinology. Continues to smoke. Discussed recommendation to quit.  Increased stress. Discussed. Will notify me if feels needs further intervention. Discussed increasing zoloft  vs adding medication at night. Increased cough. Has been present for a while. No fever. No increased congestion. Coughing fits. No acid reflux.    Past Medical History:  Diagnosis Date   Allergy    Anxiety    Depression    GERD (gastroesophageal reflux disease)    Heart murmur    Hyperlipidemia    Thyroid  disease    Past Surgical History:  Procedure Laterality Date   ABDOMINAL HYSTERECTOMY     previous abnormal pap smear   APPENDECTOMY     BREAST BIOPSY     TONSILLECTOMY     Family History  Problem Relation Age of Onset   Arthritis Mother    Stroke Mother    Hypertension Mother    Arthritis Father    Heart disease Father    Heart disease Brother    Cancer Maternal Aunt        breast   Social History   Socioeconomic History   Marital status: Married    Spouse name: Not on file   Number of  children: Not on file   Years of education: Not on file   Highest education level: Associate degree: occupational, scientist, product/process development, or vocational program  Occupational History   Not on file  Tobacco Use   Smoking status: Every Day    Current packs/day: 1.00    Average packs/day: 1 pack/day for 47.0 years (47.0 ttl pk-yrs)    Types: Cigarettes   Smokeless tobacco: Never  Substance and Sexual Activity   Alcohol use: Yes    Alcohol/week: 7.0 standard drinks of alcohol    Types: 7 Glasses of wine per week    Comment: 1 glass of wine per night   Drug use: Not on file   Sexual activity: Not on file  Other Topics Concern   Not on file  Social History Narrative   Married   Social Drivers of Health   Financial Resource Strain: Low Risk  (09/02/2024)   Overall Financial Resource Strain (CARDIA)    Difficulty of Paying Living Expenses: Not hard at all  Food Insecurity: No Food Insecurity (09/02/2024)   Hunger Vital Sign    Worried About Running Out of Food in the Last Year: Never true    Ran Out of Food in the Last Year: Never true  Transportation Needs: No Transportation Needs (09/02/2024)   PRAPARE - Administrator, Civil Service (Medical): No    Lack of Transportation (Non-Medical): No  Physical Activity:  Insufficiently Active (09/02/2024)   Exercise Vital Sign    Days of Exercise per Week: 2 days    Minutes of Exercise per Session: 20 min  Stress: Stress Concern Present (09/02/2024)   [employer] of Occupational Health - Occupational Stress Questionnaire    Feeling of Stress: To some extent  Social Connections: Socially Integrated (09/02/2024)   Social Connection and Isolation Panel    Frequency of Communication with Friends and Family: More than three times a week    Frequency of Social Gatherings with Friends and Family: Once a week    Attends Religious Services: More than 4 times per year    Active Member of [employer] or Organizations: Yes    Attends Museum/gallery Exhibitions Officer: More than 4 times per year    Marital Status: Married     Review of Systems  Constitutional:  Negative for appetite change and fever.  HENT:  Negative for congestion and postnasal drip.   Respiratory:  Positive for cough. Negative for chest tightness.        No increased tightness or sob.   Cardiovascular:  Negative for chest pain, palpitations and leg swelling.  Gastrointestinal:  Negative for abdominal pain, diarrhea, nausea and vomiting.  Genitourinary:  Negative for difficulty urinating and dysuria.  Musculoskeletal:  Negative for myalgias.       Joint pains as outlined.   Skin:  Negative for color change and rash.  Neurological:  Negative for dizziness and headaches.  Psychiatric/Behavioral:  Negative for agitation.        Increased stress as outlined.        Objective:     BP 120/70   Pulse 66   Temp 98.5 F (36.9 C) (Oral)   Ht 5' (1.524 m)   Wt 95 lb (43.1 kg)   SpO2 93%   BMI 18.55 kg/m  Wt Readings from Last 3 Encounters:  09/02/24 95 lb (43.1 kg)  05/30/24 96 lb 3.2 oz (43.6 kg)  01/26/24 102 lb 12.8 oz (46.6 kg)    Physical Exam Vitals reviewed.  Constitutional:      General: She is not in acute distress.    Appearance: Normal appearance.  HENT:     Head: Normocephalic and atraumatic.     Right Ear: External ear normal.     Left Ear: External ear normal.     Mouth/Throat:     Pharynx: No oropharyngeal exudate or posterior oropharyngeal erythema.  Eyes:     General: No scleral icterus.       Right eye: No discharge.        Left eye: No discharge.     Conjunctiva/sclera: Conjunctivae normal.  Neck:     Thyroid : No thyromegaly.  Cardiovascular:     Rate and Rhythm: Normal rate and regular rhythm.  Pulmonary:     Effort: No respiratory distress.     Breath sounds: Normal breath sounds. No wheezing.  Abdominal:     General: Bowel sounds are normal.     Palpations: Abdomen is soft.     Tenderness: There is no abdominal  tenderness.  Musculoskeletal:        General: No swelling or tenderness.     Cervical back: Neck supple. No tenderness.  Lymphadenopathy:     Cervical: No cervical adenopathy.  Skin:    Findings: No erythema or rash.  Neurological:     Mental Status: She is alert.  Psychiatric:        Mood and Affect: Mood  normal.        Behavior: Behavior normal.         Outpatient Encounter Medications as of 09/02/2024  Medication Sig   albuterol  (VENTOLIN  HFA) 108 (90 Base) MCG/ACT inhaler Inhale 2 puffs into the lungs every 6 (six) hours as needed for wheezing or shortness of breath.   aspirin  EC 81 MG tablet Take 1 tablet (81 mg total) by mouth daily. Swallow whole.   cholecalciferol (VITAMIN D ) 1000 units tablet Take 2,000 Units by mouth daily.   Cyanocobalamin  (VITAMIN B12) 1000 MCG TBCR    Esomeprazole Magnesium (NEXIUM PO) Take by mouth.   fluticasone (FLONASE) 50 MCG/ACT nasal spray Place 2 sprays into both nostrils daily.   ibuprofen (ADVIL) 200 MG tablet Take 200 mg by mouth every 6 (six) hours as needed.   meloxicam  (MOBIC ) 15 MG tablet TAKE ONE TABLET BY MOUTH ONCE DAILY   predniSONE  (DELTASONE ) 10 MG tablet Take 4 tablets x 1 day and then decrease by 1/2 tablet per day until down to zero mg.   verapamil (CALAN) 120 MG tablet Take 120 mg by mouth daily.   atorvastatin  (LIPITOR) 20 MG tablet TAKE ONE TABLET BY MOUTH AT BEDTIME FOR CHOLESTEROL   levothyroxine  (SYNTHROID ) 100 MCG tablet Take 1 tablet (100 mcg total) by mouth daily.   sertraline  (ZOLOFT ) 25 MG tablet TAKE 3 TABLETS BY MOUTH ONCE DAILY   [DISCONTINUED] atorvastatin  (LIPITOR) 20 MG tablet TAKE ONE TABLET BY MOUTH AT BEDTIME FOR CHOLESTEROL   [DISCONTINUED] levothyroxine  (SYNTHROID ) 100 MCG tablet Take 1 tablet (100 mcg total) by mouth daily.   [DISCONTINUED] sertraline  (ZOLOFT ) 25 MG tablet TAKE 1 OR 2 TABLETS BY MOUTH ONCE DAILY   No facility-administered encounter medications on file as of 09/02/2024.     Lab  Results  Component Value Date   WBC 8.6 07/01/2024   HGB 13.2 07/01/2024   HCT 39.2 07/01/2024   PLT 239.0 07/01/2024   GLUCOSE 88 08/31/2024   CHOL 180 08/31/2024   TRIG 96.0 08/31/2024   HDL 79.70 08/31/2024   LDLCALC 81 08/31/2024   ALT 16 08/31/2024   AST 19 08/31/2024   NA 140 08/31/2024   K 4.8 08/31/2024   CL 100 08/31/2024   CREATININE 0.62 08/31/2024   BUN 7 08/31/2024   CO2 33 (H) 08/31/2024   TSH 0.78 08/31/2024    DG Bone Density Result Date: 08/31/2024 EXAM: DUAL X-RAY ABSORPTIOMETRY (DXA) FOR BONE MINERAL DENSITY 08/31/2024 10:55 am CLINICAL DATA:  73 year old Female Postmenopausal. Osteoporosis - compare to last bone density History of fragility fracture. Patient is or has been on bone building therapies. TECHNIQUE: An axial (e.g., hips, spine) and/or appendicular (e.g., radius) exam was performed, as appropriate, using GE Secretary/administrator at [facility]. Images are obtained for bone mineral density measurement and are not obtained for diagnostic purposes. [id number] Exclusions: L1. COMPARISON:  06/30/2022. FINDINGS: Scan quality: Good. LUMBAR SPINE (L2-L4): BMD (in g/cm2): 1.033 T-score: -1.5 Z-score: 0.2 Rate of change from previous exam: 7.0 % LEFT FEMORAL NECK: BMD (in g/cm2): 0.727 T-score: -2.2 Z-score: -0.4 LEFT TOTAL HIP: BMD (in g/cm2): 0.772 T-score: -1.9 Z-score: -0.2 RIGHT FEMORAL NECK: BMD (in g/cm2): 0.656 T-score: -2.7 Z-score: -0.9 RIGHT TOTAL HIP: BMD (in g/cm2): 0.724 T-score: -2.3 Z-score: -0.6 DUAL-FEMUR TOTAL MEAN: Rate of change from previous exam: No significant rate of change from previous exam. FRAX 10-YEAR PROBABILITY OF FRACTURE: FRAX not reported as the lowest BMD is not in the osteopenia range. IMPRESSION:  Osteoporosis based on BMD. Fracture risk is unknown due to history of bone building therapy. RECOMMENDATIONS: 1. All patients should optimize calcium  and vitamin D  intake. 2. Consider FDA-approved medical therapies in  postmenopausal women and men aged 1 years and older, based on the following: - A hip or vertebral (clinical or morphometric) fracture - T-score less than or equal to -2.5 and secondary causes have been excluded. - Low bone mass (T-score between -1.0 and -2.5) and a 10-year probability of a hip fracture greater than or equal to 3% or a 10-year probability of a major osteoporosis-related fracture greater than or equal to 20% based on the [place] -adapted WHO algorithm. - Clinician judgment and/or patient preferences may indicate treatment for people with 10-year fracture probabilities above or below these levels 3. Patients with diagnosis of osteoporosis or at high risk for fracture should have regular bone mineral density tests. For patients eligible for Medicare, routine testing is allowed once every 2 years. The testing frequency can be increased to one year for patients who have rapidly progressing disease, those who are receiving or discontinuing medical therapy to restore bone mass, or have additional risk factors. Electronically Signed   By: Harrietta Sherry M.D.   On: 08/31/2024 16:30       Assessment & Plan:  Encounter for screening mammogram for malignant neoplasm of breast -     3D Screening Mammogram, Left and Right; Future  Need for influenza vaccination -     Flu vaccine HIGH DOSE PF(Fluzone Trivalent)  Tobacco abuse Assessment & Plan: Have discussed the need to quit.  Conitnues to smoke. Last lung screening 04/2024. Recommended f/u in 12 months.    Stress Assessment & Plan: Increased stress as outlined. Discussed treatment options. Elected to increase zoloft  to 75mg  q day. Can try magnesium glycinate q hs. Follow. Get her back in soon to reassess.    Renal cyst Assessment & Plan: Was evaluated (08/2021) - Dr Penne - recommended f/u in 2 years.  Overdue f/u. Discuss if agreeable for f/u.    Osteoporosis without current pathological fracture, unspecified osteoporosis type Assessment  & Plan: Endocrinology - 03/2024 - recommended continuing IV reclast .  Recommended f/u bone density 06/2024. Bone density as outlined. Reported no significant change. Forward to endocrinology.    Neck pain Assessment & Plan: Seeing physiatry for her neck and back pain.   Seeing Dr Dodson - neck pain. S/p ESI. Taking meloxicam . Have discussed possible side effects and risk of medication. Follow metabolic panel.    Hypothyroidism, unspecified type Assessment & Plan: On synthroid . Follow tsh.    Hypercholesterolemia Assessment & Plan: Continue lipitor.  Low cholesterol diet and exercise.  Follow lipid panel and liver function tests.  Discussed recent labs. No change in medication.  Lab Results  Component Value Date   CHOL 180 08/31/2024   HDL 79.70 08/31/2024   LDLCALC 81 08/31/2024   TRIG 96.0 08/31/2024   CHOLHDL 2 08/31/2024      Emphysema lung (HCC) Assessment & Plan: Have discussed the need to quit smoking. With increased cough today. No fever. No increased congestion. Continue albuterol  inhaler as directed. Prednisone  taper as directed. Follow.  Call with update. She did receive her flu vaccine today. Discussed.    Carotid artery disease, unspecified laterality, unspecified type Assessment & Plan: Life line screening - 10/21/21 - mild carotid artery disease.  Continue statin and adequate blood pressure control.  Just saw cardiology. Had carotid ultrasound 01/13/24. No significant stenosis.    Coronary artery  disease involving native coronary artery of native heart without angina pectoris Assessment & Plan: Previous CT chest - calcification in aorta and Left main and 3 vessel disease.  Have discussed risk factor modification. Have discussed the need to quit smoking.  Family history.  Saw cardiology.  myoview unremarkable.  ECHO with preserved LV function.  Continue medical management. No changes. Recent cardiac evaluation - continue lipitor and aspirin .    Other orders -      Atorvastatin  Calcium ; TAKE ONE TABLET BY MOUTH AT BEDTIME FOR CHOLESTEROL  Dispense: 90 tablet; Refill: 3 -     Levothyroxine  Sodium; Take 1 tablet (100 mcg total) by mouth daily.  Dispense: 90 tablet; Refill: 1 -     Sertraline  HCl; TAKE 3 TABLETS BY MOUTH ONCE DAILY  Dispense: 270 tablet; Refill: 1 -     predniSONE ; Take 4 tablets x 1 day and then decrease by 1/2 tablet per day until down to zero mg.  Dispense: 18 tablet; Refill: 0   I spent 45 minutes with the patient.  Time spent discussing her current concerns and symptoms. Specifically time spent discussing her cough, increased stress and bone density.  Time also spent discussing further w/up, evaluation and treatment.    Allena Hamilton, MD

## 2024-09-04 ENCOUNTER — Telehealth: Payer: Self-pay | Admitting: Internal Medicine

## 2024-09-04 ENCOUNTER — Encounter: Payer: Self-pay | Admitting: Internal Medicine

## 2024-09-04 NOTE — Assessment & Plan Note (Signed)
 Have discussed the need to quit.  Conitnues to smoke. Last lung screening 04/2024. Recommended f/u in 12 months.

## 2024-09-04 NOTE — Assessment & Plan Note (Signed)
 Was evaluated (08/2021) - Dr Penne - recommended f/u in 2 years.  Overdue f/u. Discuss if agreeable for f/u.

## 2024-09-04 NOTE — Assessment & Plan Note (Signed)
 Increased stress as outlined. Discussed treatment options. Elected to increase zoloft  to 75mg  q day. Can try magnesium glycinate q hs. Follow. Get her back in soon to reassess.

## 2024-09-04 NOTE — Assessment & Plan Note (Signed)
 Seeing physiatry for her neck and back pain.   Seeing Dr Dodson - neck pain. S/p ESI. Taking meloxicam . Have discussed possible side effects and risk of medication. Follow metabolic panel.

## 2024-09-04 NOTE — Assessment & Plan Note (Signed)
 Life line screening - 10/21/21 - mild carotid artery disease.  Continue statin and adequate blood pressure control.  Just saw cardiology. Had carotid ultrasound 01/13/24. No significant stenosis.

## 2024-09-04 NOTE — Assessment & Plan Note (Signed)
On synthroid.  Follow tsh.   

## 2024-09-04 NOTE — Assessment & Plan Note (Addendum)
 Have discussed the need to quit smoking. With increased cough today. No fever. No increased congestion. Continue albuterol  inhaler as directed. Prednisone  taper as directed. Follow.  Call with update. She did receive her flu vaccine today. Discussed.

## 2024-09-04 NOTE — Assessment & Plan Note (Signed)
 Endocrinology - 03/2024 - recommended continuing IV reclast .  Recommended f/u bone density 06/2024. Bone density as outlined. Reported no significant change. Forward to endocrinology.

## 2024-09-04 NOTE — Assessment & Plan Note (Signed)
 Previous CT chest - calcification in aorta and Left main and 3 vessel disease.  Have discussed risk factor modification. Have discussed the need to quit smoking.  Family history.  Saw cardiology.  myoview unremarkable.  ECHO with preserved LV function.  Continue medical management. No changes. Recent cardiac evaluation - continue lipitor and aspirin .

## 2024-09-04 NOTE — Assessment & Plan Note (Signed)
 Continue lipitor.  Low cholesterol diet and exercise.  Follow lipid panel and liver function tests.  Discussed recent labs. No change in medication.  Lab Results  Component Value Date   CHOL 180 08/31/2024   HDL 79.70 08/31/2024   LDLCALC 81 08/31/2024   TRIG 96.0 08/31/2024   CHOLHDL 2 08/31/2024

## 2024-09-04 NOTE — Telephone Encounter (Signed)
 Please call and confirm she is doing ok. Had prescribed prednisone  for her for increased cough. Want to confirm doing ok. Also, inform her that she had previously seen Dr Penne (urology) for f/u renal cyst. She had recommended f/u in 2 years. In reviewing the chart, she appears to be overdue f/u. Need to see if agreeable for f/u and needs appt. Also forward her bone density results to endocrinology [facility]). Copy placed in box.

## 2024-09-05 NOTE — Telephone Encounter (Signed)
 Pt called and lvm to return call  Ok for e2c2 to relay message

## 2024-09-12 DIAGNOSIS — Z1231 Encounter for screening mammogram for malignant neoplasm of breast: Secondary | ICD-10-CM | POA: Diagnosis not present

## 2024-09-12 LAB — HM MAMMOGRAPHY

## 2024-10-27 ENCOUNTER — Ambulatory Visit: Admitting: Internal Medicine

## 2024-10-27 ENCOUNTER — Encounter: Payer: Self-pay | Admitting: Internal Medicine

## 2024-10-27 VITALS — BP 126/62 | HR 71 | Temp 98.0°F | Ht 60.0 in | Wt 94.6 lb

## 2024-10-27 DIAGNOSIS — I779 Disorder of arteries and arterioles, unspecified: Secondary | ICD-10-CM | POA: Diagnosis not present

## 2024-10-27 DIAGNOSIS — Z720 Tobacco use: Secondary | ICD-10-CM

## 2024-10-27 DIAGNOSIS — E039 Hypothyroidism, unspecified: Secondary | ICD-10-CM | POA: Diagnosis not present

## 2024-10-27 DIAGNOSIS — F439 Reaction to severe stress, unspecified: Secondary | ICD-10-CM | POA: Diagnosis not present

## 2024-10-27 DIAGNOSIS — J439 Emphysema, unspecified: Secondary | ICD-10-CM

## 2024-10-27 DIAGNOSIS — M810 Age-related osteoporosis without current pathological fracture: Secondary | ICD-10-CM | POA: Diagnosis not present

## 2024-10-27 DIAGNOSIS — M542 Cervicalgia: Secondary | ICD-10-CM | POA: Diagnosis not present

## 2024-10-27 DIAGNOSIS — E7800 Pure hypercholesterolemia, unspecified: Secondary | ICD-10-CM

## 2024-10-27 DIAGNOSIS — F419 Anxiety disorder, unspecified: Secondary | ICD-10-CM | POA: Diagnosis not present

## 2024-10-27 DIAGNOSIS — N281 Cyst of kidney, acquired: Secondary | ICD-10-CM

## 2024-10-27 DIAGNOSIS — I2510 Atherosclerotic heart disease of native coronary artery without angina pectoris: Secondary | ICD-10-CM

## 2024-10-27 LAB — TSH: TSH: 2.47 u[IU]/mL (ref 0.35–5.50)

## 2024-10-27 NOTE — Progress Notes (Signed)
 "  Subjective:    Patient ID: Leah Benitez, female    DOB: 1951-03-11, 74 y.o.   MRN: [id number]  Patient here for  Chief Complaint  Patient presents with   Medical Management of Chronic Issues    HPI Here for a scheduled follow up - follow up regarding CAD, neck pain, chronic headaches and hypercholesterolemia. Saw neurology 11/12/23 - chronic headaches - continue verapamil and recommended high flow 100% oxygen prn. Has seen Dr Dodson - neck pain. S/p ESI. Takes meloxicam  prn. Saw [facility] 12/30/23. Continues with pain. Discussed options. Discussed f/u with Dr Dodson.  Evaluated by cardiology 12/31/23 - recommended continuing aspirin  and lipitor and carotid doppler. (Performed 01/13/24). Carotid ultrasound - no significant stenosis. Continues on zoloft . Had f/u with endocrinology 04/07/24- recommended continuing reclast . Infusion 05/06/24. continues to smoke.discussed.  Last visit, discussed increased stress. Zoloft  increased to 75mg  q day. Feels had helped.   Question follow up - urology.  Past Medical History:  Diagnosis Date   Allergy    Anxiety    Depression    GERD (gastroesophageal reflux disease)    Heart murmur    Hyperlipidemia    Thyroid  disease    Past Surgical History:  Procedure Laterality Date   ABDOMINAL HYSTERECTOMY     previous abnormal pap smear   APPENDECTOMY     BREAST BIOPSY     TONSILLECTOMY     Family History  Problem Relation Age of Onset   Arthritis Mother    Stroke Mother    Hypertension Mother    Arthritis Father    Heart disease Father    Heart disease Brother    Cancer Maternal Aunt        breast   Social History   Socioeconomic History   Marital status: Married    Spouse name: Not on file   Number of children: Not on file   Years of education: Not on file   Highest education level: Associate degree: occupational, scientist, product/process development, or vocational program  Occupational History   Not on file  Tobacco Use   Smoking status: Every Day    Current packs/day:  1.00    Average packs/day: 1 pack/day for 47.0 years (47.0 ttl pk-yrs)    Types: Cigarettes   Smokeless tobacco: Never  Substance and Sexual Activity   Alcohol use: Yes    Alcohol/week: 7.0 standard drinks of alcohol    Types: 7 Glasses of wine per week    Comment: 1 glass of wine per night   Drug use: Not on file   Sexual activity: Not on file  Other Topics Concern   Not on file  Social History Narrative   Married   Social Drivers of Health   Tobacco Use: High Risk (10/30/2024)   Patient History    Smoking Tobacco Use: Every Day    Smokeless Tobacco Use: Never    Passive Exposure: Not on file  Financial Resource Strain: Low Risk (09/02/2024)   Overall Financial Resource Strain (CARDIA)    Difficulty of Paying Living Expenses: Not hard at all  Food Insecurity: No Food Insecurity (09/02/2024)   Epic    Worried About Programme Researcher, Broadcasting/film/video in the Last Year: Never true    Ran Out of Food in the Last Year: Never true  Transportation Needs: No Transportation Needs (09/02/2024)   Epic    Lack of Transportation (Medical): No    Lack of Transportation (Non-Medical): No  Physical Activity: Insufficiently Active (09/02/2024)   Exercise Vital  Sign    Days of Exercise per Week: 2 days    Minutes of Exercise per Session: 20 min  Stress: Stress Concern Present (09/02/2024)   [employer] of Occupational Health - Occupational Stress Questionnaire    Feeling of Stress: To some extent  Social Connections: Socially Integrated (09/02/2024)   Social Connection and Isolation Panel    Frequency of Communication with Friends and Family: More than three times a week    Frequency of Social Gatherings with Friends and Family: Once a week    Attends Religious Services: More than 4 times per year    Active Member of Clubs or Organizations: Yes    Attends Banker Meetings: More than 4 times per year    Marital Status: Married  Depression (PHQ2-9): Medium Risk (09/02/2024)    Depression (PHQ2-9)    PHQ-2 Score: 5  Alcohol Screen: Low Risk (09/02/2024)   Alcohol Screen    Last Alcohol Screening Score (AUDIT): 4  Housing: Unknown (09/02/2024)   Epic    Unable to Pay for Housing in the Last Year: No    Number of Times Moved in the Last Year: Not on file    Homeless in the Last Year: No  Utilities: Not At Risk (04/07/2024)   Received from [facility]   Epic    In the past 12 months has the electric, gas, oil, or water company threatened to shut off services in your home?: No  Health Literacy: Adequate Health Literacy (11/09/2023)   B1300 Health Literacy    Frequency of need for help with medical instructions: Never     Review of Systems  Constitutional:  Negative for appetite change.       Weight stable from last check.   HENT:  Negative for congestion and sinus pressure.   Respiratory:  Negative for cough, chest tightness and shortness of breath.   Cardiovascular:  Negative for chest pain, palpitations and leg swelling.  Gastrointestinal:  Negative for abdominal pain, diarrhea, nausea and vomiting.  Genitourinary:  Negative for difficulty urinating and dysuria.  Musculoskeletal:  Positive for neck pain. Negative for myalgias.  Skin:  Negative for color change and rash.  Neurological:  Negative for dizziness and headaches.  Psychiatric/Behavioral:  Negative for agitation and dysphoric mood.        Objective:     BP 126/62   Pulse 71   Temp 98 F (36.7 C) (Oral)   Ht 5' (1.524 m)   Wt 94 lb 9.6 oz (42.9 kg)   SpO2 92%   BMI 18.48 kg/m  Wt Readings from Last 3 Encounters:  10/27/24 94 lb 9.6 oz (42.9 kg)  09/02/24 95 lb (43.1 kg)  05/30/24 96 lb 3.2 oz (43.6 kg)    Physical Exam Vitals reviewed.  Constitutional:      General: She is not in acute distress.    Appearance: Normal appearance.  HENT:     Head: Normocephalic and atraumatic.     Right Ear: External ear normal.     Left Ear: External ear normal.      Mouth/Throat:     Pharynx: No oropharyngeal exudate or posterior oropharyngeal erythema.  Eyes:     General: No scleral icterus.       Right eye: No discharge.        Left eye: No discharge.     Conjunctiva/sclera: Conjunctivae normal.  Neck:     Thyroid : No thyromegaly.  Cardiovascular:     Rate and  Rhythm: Normal rate and regular rhythm.  Pulmonary:     Effort: No respiratory distress.     Breath sounds: Normal breath sounds. No wheezing.  Abdominal:     General: Bowel sounds are normal.     Palpations: Abdomen is soft.     Tenderness: There is no abdominal tenderness.  Musculoskeletal:        General: No swelling or tenderness.     Cervical back: Neck supple. No tenderness.  Lymphadenopathy:     Cervical: No cervical adenopathy.  Skin:    Findings: No erythema or rash.  Neurological:     Mental Status: She is alert.  Psychiatric:        Mood and Affect: Mood normal.        Behavior: Behavior normal.         Outpatient Encounter Medications as of 10/27/2024  Medication Sig   albuterol  (VENTOLIN  HFA) 108 (90 Base) MCG/ACT inhaler Inhale 2 puffs into the lungs every 6 (six) hours as needed for wheezing or shortness of breath.   aspirin  EC 81 MG tablet Take 1 tablet (81 mg total) by mouth daily. Swallow whole.   atorvastatin  (LIPITOR) 20 MG tablet TAKE ONE TABLET BY MOUTH AT BEDTIME FOR CHOLESTEROL   cholecalciferol (VITAMIN D ) 1000 units tablet Take 2,000 Units by mouth daily.   Cyanocobalamin  (VITAMIN B12) 1000 MCG TBCR    Esomeprazole Magnesium (NEXIUM PO) Take by mouth.   fluticasone (FLONASE) 50 MCG/ACT nasal spray Place 2 sprays into both nostrils daily.   ibuprofen (ADVIL) 200 MG tablet Take 200 mg by mouth every 6 (six) hours as needed.   levothyroxine  (SYNTHROID ) 100 MCG tablet Take 1 tablet (100 mcg total) by mouth daily.   meloxicam  (MOBIC ) 15 MG tablet TAKE ONE TABLET BY MOUTH ONCE DAILY   sertraline  (ZOLOFT ) 25 MG tablet TAKE 3 TABLETS BY MOUTH ONCE DAILY    verapamil (CALAN) 120 MG tablet Take 120 mg by mouth daily.   [DISCONTINUED] predniSONE  (DELTASONE ) 10 MG tablet Take 4 tablets x 1 day and then decrease by 1/2 tablet per day until down to zero mg.   No facility-administered encounter medications on file as of 10/27/2024.     Lab Results  Component Value Date   WBC 8.6 07/01/2024   HGB 13.2 07/01/2024   HCT 39.2 07/01/2024   PLT 239.0 07/01/2024   GLUCOSE 88 08/31/2024   CHOL 180 08/31/2024   TRIG 96.0 08/31/2024   HDL 79.70 08/31/2024   LDLCALC 81 08/31/2024   ALT 16 08/31/2024   AST 19 08/31/2024   NA 140 08/31/2024   K 4.8 08/31/2024   CL 100 08/31/2024   CREATININE 0.62 08/31/2024   BUN 7 08/31/2024   CO2 33 (H) 08/31/2024   TSH 2.47 10/27/2024    DG Bone Density Result Date: 08/31/2024 EXAM: DUAL X-RAY ABSORPTIOMETRY (DXA) FOR BONE MINERAL DENSITY 08/31/2024 10:55 am CLINICAL DATA:  74 year old Female Postmenopausal. Osteoporosis - compare to last bone density History of fragility fracture. Patient is or has been on bone building therapies. TECHNIQUE: An axial (e.g., hips, spine) and/or appendicular (e.g., radius) exam was performed, as appropriate, using GE Secretary/administrator at [facility]. Images are obtained for bone mineral density measurement and are not obtained for diagnostic purposes. [id number] Exclusions: L1. COMPARISON:  06/30/2022. FINDINGS: Scan quality: Good. LUMBAR SPINE (L2-L4): BMD (in g/cm2): 1.033 T-score: -1.5 Z-score: 0.2 Rate of change from previous exam: 7.0 % LEFT FEMORAL NECK: BMD (in g/cm2):  0.727 T-score: -2.2 Z-score: -0.4 LEFT TOTAL HIP: BMD (in g/cm2): 0.772 T-score: -1.9 Z-score: -0.2 RIGHT FEMORAL NECK: BMD (in g/cm2): 0.656 T-score: -2.7 Z-score: -0.9 RIGHT TOTAL HIP: BMD (in g/cm2): 0.724 T-score: -2.3 Z-score: -0.6 DUAL-FEMUR TOTAL MEAN: Rate of change from previous exam: No significant rate of change from previous exam. FRAX 10-YEAR PROBABILITY OF FRACTURE: FRAX not  reported as the lowest BMD is not in the osteopenia range. IMPRESSION: Osteoporosis based on BMD. Fracture risk is unknown due to history of bone building therapy. RECOMMENDATIONS: 1. All patients should optimize calcium  and vitamin D  intake. 2. Consider FDA-approved medical therapies in postmenopausal women and men aged 18 years and older, based on the following: - A hip or vertebral (clinical or morphometric) fracture - T-score less than or equal to -2.5 and secondary causes have been excluded. - Low bone mass (T-score between -1.0 and -2.5) and a 10-year probability of a hip fracture greater than or equal to 3% or a 10-year probability of a major osteoporosis-related fracture greater than or equal to 20% based on the [place] -adapted WHO algorithm. - Clinician judgment and/or patient preferences may indicate treatment for people with 10-year fracture probabilities above or below these levels 3. Patients with diagnosis of osteoporosis or at high risk for fracture should have regular bone mineral density tests. For patients eligible for Medicare, routine testing is allowed once every 2 years. The testing frequency can be increased to one year for patients who have rapidly progressing disease, those who are receiving or discontinuing medical therapy to restore bone mass, or have additional risk factors. Electronically Signed   By: Harrietta Sherry M.D.   On: 08/31/2024 16:30       Assessment & Plan:  Hypercholesterolemia Assessment & Plan: Continue lipitor.  Low cholesterol diet and exercise.  Follow lipid panel.  Lab Results  Component Value Date   CHOL 180 08/31/2024   HDL 79.70 08/31/2024   LDLCALC 81 08/31/2024   TRIG 96.0 08/31/2024   CHOLHDL 2 08/31/2024     Orders: -     Hepatic function panel; Future -     Lipid panel; Future -     Basic metabolic panel with GFR; Future -     CBC with Differential/Platelet; Future  Hypothyroidism, unspecified type Assessment & Plan: On synthroid . Follow  tsh.   Orders: -     TSH  Tobacco abuse Assessment & Plan: Have discussed the need to quit.  Conitnues to smoke. Last lung screening 04/2024. Recommended f/u in 12 months.    Stress Assessment & Plan: Discussed increased stress - last visit - increased zoloft  to 75mg  q day. Feels is helping. Follow. Notify me if feels needs further intervention.    Renal cyst Assessment & Plan: Was evaluated (08/2021) - Dr Penne - recommended f/u in 2 years.  Overdue f/u. Need to dscuss if agreeable for f/u.    Osteoporosis without current pathological fracture, unspecified osteoporosis type Assessment & Plan: Endocrinology - 03/2024 - recommended continuing IV reclast .  Recommended f/u bone density 06/2024. Bone density as outlined. Reported no significant change. Had f/u with endocrinology 04/07/24- recommended continuing reclast . Infusion 05/06/24.   Neck pain Assessment & Plan: Seeing physiatry for her neck and back pain.  Has seen Dr Dodson - neck pain. S/p ESI. Taking meloxicam . Have discussed possible side effects and risk of medication. Follow metabolic panel. Persistent increased pain. Refer back to Dr Dodson.   Orders: -     Ambulatory referral to Orthopedic Surgery  Emphysema lung (HCC) Assessment & Plan: Have discussed the need to quit smoking. Declines at this time.    Carotid artery disease, unspecified laterality, unspecified type Assessment & Plan: Life line screening - 10/21/21 - mild carotid artery disease.  Continue statin and adequate blood pressure control.  Just saw cardiology. Had carotid ultrasound 01/13/24. No significant stenosis.    Coronary artery disease involving native coronary artery of native heart without angina pectoris Assessment & Plan: Previous CT chest - calcification in aorta and Left main and 3 vessel disease.  Have discussed risk factor modification. Have discussed the need to quit smoking.  Family history.  Saw cardiology.  myoview unremarkable.  ECHO  with preserved LV function.  Continue medical management. No changes. Recent cardiac evaluation - continue lipitor and aspirin .    Anxiety Assessment & Plan: Continue zoloft  at current dose. Follow.       Allena Hamilton, MD "

## 2024-10-28 ENCOUNTER — Encounter: Payer: Self-pay | Admitting: Internal Medicine

## 2024-10-28 ENCOUNTER — Ambulatory Visit: Payer: Self-pay | Admitting: Internal Medicine

## 2024-10-28 NOTE — Telephone Encounter (Signed)
 See lab result note. Tsh wnl. If she is taking 112 mcg - stay on this dose.

## 2024-10-30 ENCOUNTER — Encounter: Payer: Self-pay | Admitting: Internal Medicine

## 2024-10-30 ENCOUNTER — Telehealth: Payer: Self-pay | Admitting: Internal Medicine

## 2024-10-30 DIAGNOSIS — N281 Cyst of kidney, acquired: Secondary | ICD-10-CM

## 2024-10-30 NOTE — Assessment & Plan Note (Signed)
 Have discussed the need to quit smoking. Declines at this time.

## 2024-10-30 NOTE — Assessment & Plan Note (Signed)
On synthroid.  Follow tsh.   

## 2024-10-30 NOTE — Assessment & Plan Note (Signed)
 Endocrinology - 03/2024 - recommended continuing IV reclast .  Recommended f/u bone density 06/2024. Bone density as outlined. Reported no significant change. Had f/u with endocrinology 04/07/24- recommended continuing reclast . Infusion 05/06/24.

## 2024-10-30 NOTE — Assessment & Plan Note (Signed)
 Was evaluated (08/2021) - Dr Penne - recommended f/u in 2 years.  Overdue f/u. Need to dscuss if agreeable for f/u.

## 2024-10-30 NOTE — Assessment & Plan Note (Signed)
 Discussed increased stress - last visit - increased zoloft  to 75mg  q day. Feels is helping. Follow. Notify me if feels needs further intervention.

## 2024-10-30 NOTE — Assessment & Plan Note (Signed)
 Life line screening - 10/21/21 - mild carotid artery disease.  Continue statin and adequate blood pressure control.  Just saw cardiology. Had carotid ultrasound 01/13/24. No significant stenosis.

## 2024-10-30 NOTE — Telephone Encounter (Signed)
 Please call and notify Leah Benitez that after reviewing, she had previously seen Dr Penne for f/u renal cyst. Dr Penne had recommended a f/u in 2 years. If agreeable, would like to schedule f/u. Dr Penne is no longer at [facility]. See if agreeable for f/u.

## 2024-10-30 NOTE — Assessment & Plan Note (Signed)
 Continue lipitor.  Low cholesterol diet and exercise.  Follow lipid panel.  Lab Results  Component Value Date   CHOL 180 08/31/2024   HDL 79.70 08/31/2024   LDLCALC 81 08/31/2024   TRIG 96.0 08/31/2024   CHOLHDL 2 08/31/2024

## 2024-10-30 NOTE — Assessment & Plan Note (Signed)
 Continue zoloft  at current dose. Follow.

## 2024-10-30 NOTE — Assessment & Plan Note (Signed)
 Previous CT chest - calcification in aorta and Left main and 3 vessel disease.  Have discussed risk factor modification. Have discussed the need to quit smoking.  Family history.  Saw cardiology.  myoview unremarkable.  ECHO with preserved LV function.  Continue medical management. No changes. Recent cardiac evaluation - continue lipitor and aspirin .

## 2024-10-30 NOTE — Assessment & Plan Note (Signed)
 Seeing physiatry for her neck and back pain.  Has seen Dr Dodson - neck pain. S/p ESI. Taking meloxicam . Have discussed possible side effects and risk of medication. Follow metabolic panel. Persistent increased pain. Refer back to Dr Dodson.

## 2024-10-30 NOTE — Assessment & Plan Note (Signed)
 Have discussed the need to quit.  Conitnues to smoke. Last lung screening 04/2024. Recommended f/u in 12 months.

## 2024-11-01 NOTE — Telephone Encounter (Signed)
 Order placed for urology referral.

## 2024-11-01 NOTE — Addendum Note (Signed)
 Addended by: GLENDIA ALLENA RAMAN on: 11/01/2024 08:59 PM   Modules accepted: Orders

## 2024-11-11 ENCOUNTER — Ambulatory Visit (INDEPENDENT_AMBULATORY_CARE_PROVIDER_SITE_OTHER): Payer: Medicare Other | Admitting: *Deleted

## 2024-11-11 VITALS — BP 126/80 | Ht 60.0 in | Wt 95.0 lb

## 2024-11-11 DIAGNOSIS — Z0000 Encounter for general adult medical examination without abnormal findings: Secondary | ICD-10-CM | POA: Diagnosis not present

## 2024-11-11 NOTE — Progress Notes (Signed)
 "  Chief Complaint  Patient presents with   Medicare Wellness     Subjective:   Leah Benitez is a 74 y.o. female who presents for a Medicare Annual Wellness Visit.  Visit info / Clinical Intake: Medicare Wellness Visit Type:: Subsequent Annual Wellness Visit Persons participating in visit and providing information:: patient Medicare Wellness Visit Mode:: Video Since this visit was completed virtually, some vitals may be partially provided or unavailable. Missing vitals are due to the limitations of the virtual format.: Documented vitals are patient reported If Telephone or Video please confirm:: I connected with patient using audio/video enable telemedicine. I verified patient identity with two identifiers, discussed telehealth limitations, and patient agreed to proceed. Patient Location:: Home Provider Location:: Office/Home Interpreter Needed?: No Pre-visit prep was completed: yes AWV questionnaire completed by patient prior to visit?: no Living arrangements:: lives with spouse/significant other Patient's Overall Health Status Rating: (!) fair Typical amount of pain: some (Neck pain for years) Does pain affect daily life?: (!) yes Are you currently prescribed opioids?: no  Dietary Habits and Nutritional Risks How many meals a day?: 2 Eats fruit and vegetables daily?: yes Most meals are obtained by: preparing own meals In the last 2 weeks, have you had any of the following?: none Diabetic:: no  Functional Status Activities of Daily Living (to include ambulation/medication): Independent Ambulation: Independent Medication Administration: Independent Home Management (perform basic housework or laundry): Independent Manage your own finances?: yes Primary transportation is: driving Concerns about vision?: no *vision screening is required for WTM* Concerns about hearing?: no  Fall Screening Falls in the past year?: 0 Number of falls in past year: 0 Was there an injury with  Fall?: 0 Fall Risk Category Calculator: 0 Patient Fall Risk Level: Low Fall Risk  Fall Risk Patient at Risk for Falls Due to: No Fall Risks Fall risk Follow up: Falls evaluation completed; Falls prevention discussed  Home and Transportation Safety: All rugs have non-skid backing?: yes All stairs or steps have railings?: yes Grab bars in the bathtub or shower?: yes Have non-skid surface in bathtub or shower?: yes Good home lighting?: yes Regular seat belt use?: yes Hospital stays in the last year:: no  Cognitive Assessment Difficulty concentrating, remembering, or making decisions? : no Will 6CIT or Mini Cog be Completed: yes What year is it?: 0 points What month is it?: 0 points Give patient an address phrase to remember (5 components): [address] [place] About what time is it?: 0 points Count backwards from 20 to 1: 0 points Say the months of the year in reverse: 0 points Repeat the address phrase from earlier: 0 points 6 CIT Score: 0 points  Advance Directives (For Healthcare) Does Patient Have a Medical Advance Directive?: Yes Does patient want to make changes to medical advance directive?: No - Patient declined Type of Advance Directive: Healthcare Power of [place]; Living will Copy of Healthcare Power of Attorney in Chart?: No - copy requested Copy of Living Will in Chart?: No - copy requested  Reviewed/Updated  Reviewed/Updated: Reviewed All (Medical, Surgical, Family, Medications, Allergies, Care Teams, Patient Goals)    Allergies (verified) Actonel [risedronate sodium] and Penicillins   Current Medications (verified) Outpatient Encounter Medications as of 11/11/2024  Medication Sig   albuterol  (VENTOLIN  HFA) 108 (90 Base) MCG/ACT inhaler Inhale 2 puffs into the lungs every 6 (six) hours as needed for wheezing or shortness of breath.   aspirin  EC 81 MG tablet Take 1 tablet (81 mg total) by mouth  daily. Swallow whole.   atorvastatin  (LIPITOR) 20 MG  tablet TAKE ONE TABLET BY MOUTH AT BEDTIME FOR CHOLESTEROL   cholecalciferol (VITAMIN D ) 1000 units tablet Take 2,000 Units by mouth daily.   Cyanocobalamin  (VITAMIN B12) 1000 MCG TBCR    Esomeprazole Magnesium (NEXIUM PO) Take by mouth.   fluticasone (FLONASE) 50 MCG/ACT nasal spray Place 2 sprays into both nostrils daily.   ibuprofen (ADVIL) 200 MG tablet Take 200 mg by mouth every 6 (six) hours as needed.   levothyroxine  (SYNTHROID ) 100 MCG tablet Take 1 tablet (100 mcg total) by mouth daily.   MAGNESIUM PO Take by mouth daily.   sertraline  (ZOLOFT ) 25 MG tablet TAKE 3 TABLETS BY MOUTH ONCE DAILY   verapamil (CALAN) 120 MG tablet Take 120 mg by mouth daily.   meloxicam  (MOBIC ) 15 MG tablet TAKE ONE TABLET BY MOUTH ONCE DAILY (Patient not taking: Reported on 11/11/2024)   No facility-administered encounter medications on file as of 11/11/2024.    History: Past Medical History:  Diagnosis Date   Allergy    Anxiety    Depression    GERD (gastroesophageal reflux disease)    Heart murmur    Hyperlipidemia    Thyroid  disease    Past Surgical History:  Procedure Laterality Date   ABDOMINAL HYSTERECTOMY     previous abnormal pap smear   APPENDECTOMY     BREAST BIOPSY     TONSILLECTOMY     Family History  Problem Relation Age of Onset   Arthritis Mother    Stroke Mother    Hypertension Mother    Arthritis Father    Heart disease Father    Heart disease Brother    Cancer Maternal Aunt        breast   Social History   Occupational History   Not on file  Tobacco Use   Smoking status: Every Day    Current packs/day: 1.00    Average packs/day: 1 pack/day for 47.0 years (47.0 ttl pk-yrs)    Types: Cigarettes   Smokeless tobacco: Never  Substance and Sexual Activity   Alcohol use: Yes    Alcohol/week: 7.0 standard drinks of alcohol    Types: 7 Glasses of wine per week    Comment: 1 glass of wine per night   Drug use: Not on file   Sexual activity: Not on file   Tobacco  Counseling Ready to quit: Yes Counseling given: Yes  SDOH Screenings   Food Insecurity: No Food Insecurity (11/11/2024)  Housing: Low Risk (11/11/2024)  Transportation Needs: No Transportation Needs (11/11/2024)  Utilities: Not At Risk (11/11/2024)  Alcohol Screen: Low Risk (11/11/2024)  Depression (PHQ2-9): Low Risk (11/11/2024)  Recent Concern: Depression (PHQ2-9) - Medium Risk (09/02/2024)  Financial Resource Strain: Low Risk (11/11/2024)  Physical Activity: Insufficiently Active (11/11/2024)  Social Connections: Moderately Integrated (11/11/2024)  Stress: Stress Concern Present (11/11/2024)  Tobacco Use: High Risk (11/11/2024)  Health Literacy: Adequate Health Literacy (11/11/2024)   See flowsheets for full screening details  Depression Screen PHQ 2 & 9 Depression Scale- Over the past 2 weeks, how often have you been bothered by any of the following problems? Little interest or pleasure in doing things: 0 Feeling down, depressed, or hopeless (PHQ Adolescent also includes...irritable): 0 PHQ-2 Total Score: 0 Trouble falling or staying asleep, or sleeping too much: 1 Feeling tired or having little energy: 1 Poor appetite or overeating (PHQ Adolescent also includes...weight loss): 0 Feeling bad about yourself - or that you are a failure  or have let yourself or your family down: 0 Trouble concentrating on things, such as reading the newspaper or watching television (PHQ Adolescent also includes...like school work): 0 Moving or speaking so slowly that other people could have noticed. Or the opposite - being so fidgety or restless that you have been moving around a lot more than usual: 0 Thoughts that you would be better off dead, or of hurting yourself in some way: 0 PHQ-9 Total Score: 2 If you checked off any problems, how difficult have these problems made it for you to do your work, take care of things at home, or get along with other people?: Not difficult at all     Goals Addressed              This Visit's Progress    Patient Stated       Wants to get more exercise             Objective:    Today's Vitals   11/11/24 0843  BP: 126/80  Weight: 95 lb (43.1 kg)  Height: 5' (1.524 m)   Body mass index is 18.55 kg/m.  Hearing/Vision screen Hearing Screening - Comments:: No issues Vision Screening - Comments:: Readers, [facility] in [place], up to date Immunizations and Health Maintenance Health Maintenance  Topic Date Due   Zoster Vaccines- Shingrix (1 of 2) Never done   COVID-19 Vaccine (7 - 2025-26 season) 06/13/2024   Lung Cancer Screening  03/31/2025   Mammogram  09/12/2025   Colonoscopy  10/21/2025   Medicare Annual Wellness (AWV)  11/11/2025   DTaP/Tdap/Td (2 - Td or Tdap) 01/20/2027   Pneumococcal Vaccine: 50+ Years  Completed   Influenza Vaccine  Completed   Bone Density Scan  Completed   Hepatitis C Screening  Completed   Meningococcal B Vaccine  Aged Out        Assessment/Plan:  This is a routine wellness examination for Leah Benitez.  Patient Care Team: Glendia Shad, MD as PCP - General (Internal Medicine) Dewane Shiner, DO as Referring Physician (Cardiology) Damian Therisa HERO, MD as Physician Assistant (Endocrinology) Claudene Penne ORN, MD as Consulting Physician (Neurosurgery) Florencio Cara BIRCH, MD as Consulting Physician (Cardiology)  I have personally reviewed and noted the following in the patients chart:   Medical and social history Use of alcohol, tobacco or illicit drugs  Current medications and supplements including opioid prescriptions. Functional ability and status Nutritional status Physical activity Advanced directives List of other physicians Hospitalizations, surgeries, and ER visits in previous 12 months Vitals Screenings to include cognitive, depression, and falls Referrals and appointments  No orders of the defined types were placed in this encounter.  In addition, I have reviewed and discussed  with patient certain preventive protocols, quality metrics, and best practice recommendations. A written personalized care plan for preventive services as well as general preventive health recommendations were provided to patient.   Angeline Fredericks, LPN   8/69/7973   Return in 1 year (on 11/11/2025).  After Visit Summary: (MyChart) Due to this being a telephonic visit, the after visit summary with patients personalized plan was offered to patient via MyChart   Nurse Notes: Patient plans on getting her shingles vaccines at her pharmacy. Patient declines covid vaccine. "

## 2024-11-11 NOTE — Patient Instructions (Signed)
 Leah Benitez,  Thank you for taking the time for your Medicare Wellness Visit. I appreciate your continued commitment to your health goals. Please review the care plan we discussed, and feel free to reach out if I can assist you further.  Please note that Annual Wellness Visits do not include a physical exam. Some assessments may be limited, especially if the visit was conducted virtually. If needed, we may recommend an in-person follow-up with your provider.  Ongoing Care Seeing your primary care provider every 3 to 6 months helps us  monitor your health and provide consistent, personalized care.  Remember to get your shingles vaccines at your pharmacy.  Referrals If a referral was made during today's visit and you haven't received any updates within two weeks, please contact the referred provider directly to check on the status.  Recommended Screenings:  Health Maintenance  Topic Date Due   Zoster (Shingles) Vaccine (1 of 2) Never done   COVID-19 Vaccine (7 - 2025-26 season) 06/13/2024   Screening for Lung Cancer  03/31/2025   Breast Cancer Screening  09/12/2025   Colon Cancer Screening  10/21/2025   Medicare Annual Wellness Visit  11/11/2025   DTaP/Tdap/Td vaccine (2 - Td or Tdap) 01/20/2027   Pneumococcal Vaccine for age over 82  Completed   Flu Shot  Completed   Osteoporosis screening with Bone Density Scan  Completed   Hepatitis C Screening  Completed   Meningitis B Vaccine  Aged Out       11/11/2024    8:50 AM  Advanced Directives  Does Patient Have a Medical Advance Directive? Yes  Type of Estate Agent of [place];Living will  Does patient want to make changes to medical advance directive? No - Patient declined  Copy of Healthcare Power of Attorney in Chart? No - copy requested    Vision: Annual vision screenings are recommended for early detection of glaucoma, cataracts, and diabetic retinopathy. These exams can also reveal signs of chronic  conditions such as diabetes and high blood pressure.  Dental: Annual dental screenings help detect early signs of oral cancer, gum disease, and other conditions linked to overall health, including heart disease and diabetes.  Please see the attached documents for additional preventive care recommendations.

## 2024-11-17 ENCOUNTER — Ambulatory Visit (INDEPENDENT_AMBULATORY_CARE_PROVIDER_SITE_OTHER): Admitting: Urology

## 2024-11-17 ENCOUNTER — Encounter: Payer: Self-pay | Admitting: Urology

## 2024-11-17 ENCOUNTER — Telehealth: Payer: Self-pay | Admitting: Internal Medicine

## 2024-11-17 VITALS — BP 154/77 | HR 71 | Ht 60.0 in | Wt 94.4 lb

## 2024-11-17 DIAGNOSIS — N3281 Overactive bladder: Secondary | ICD-10-CM | POA: Insufficient documentation

## 2024-11-17 DIAGNOSIS — N281 Cyst of kidney, acquired: Secondary | ICD-10-CM

## 2024-11-17 MED ORDER — GEMTESA 75 MG PO TABS: 75.0000 mg | ORAL_TABLET | Freq: Every day | ORAL | Status: AC

## 2024-11-17 NOTE — Assessment & Plan Note (Signed)
 She also mentioned chronic OAB today  -Also mixed UI  -Historically has avoided medications  - Reasonable to try a bladder antispasmodic at this point -1 month trial of Gemtesa , if effective we can convert to formal prescription.  If cost prohibitive, we can also try Detrol or Vesicare -Will need to reevaluate urinary leakage, distinguish OAB/UUI from pure stress leakage.  If persistent bothersome SUI-Will refer her to one of my partners

## 2024-11-17 NOTE — Assessment & Plan Note (Signed)
 Reviewed her clinical history and prior imaging I offered reassurance today-I do not think her simple cyst noted in 2022 warrants any follow-up imaging.  She was happy to hear, and very content with discontinuation of further surveillance

## 2024-11-17 NOTE — Progress Notes (Signed)
 Patient presents for an office visit. BP today is High. Greater than 140/90. Provider  notified and recheck Blood Pressure 154/77.  Pt advised to talk with PCP.  Pt voiced understanding.

## 2024-11-17 NOTE — Telephone Encounter (Signed)
 Received notification from urology. Blood pressure elevated in their office. See if she has rechecked her pressure. Have her spot check her pressure and send in readings to us . Confirm doing ok.

## 2024-11-18 NOTE — Telephone Encounter (Signed)
 Noted. Please hold for f/u blood pressure. If persistent elevation, will need f/u appt with me to discuss.

## 2024-12-30 ENCOUNTER — Other Ambulatory Visit

## 2025-01-03 ENCOUNTER — Ambulatory Visit: Admitting: Internal Medicine

## 2025-11-15 ENCOUNTER — Ambulatory Visit
